# Patient Record
Sex: Male | Born: 1960 | Race: Black or African American | Hispanic: No | Marital: Married | Smoking: Never smoker
Health system: Southern US, Community
[De-identification: ages and names within clinical notes are randomized; demographics above are authoritative.]

## PROBLEM LIST (undated history)

## (undated) DIAGNOSIS — C801 Malignant (primary) neoplasm, unspecified: Secondary | ICD-10-CM

## (undated) DIAGNOSIS — R0981 Nasal congestion: Secondary | ICD-10-CM

## (undated) DIAGNOSIS — E785 Hyperlipidemia, unspecified: Secondary | ICD-10-CM

## (undated) DIAGNOSIS — R519 Headache, unspecified: Secondary | ICD-10-CM

## (undated) DIAGNOSIS — C189 Malignant neoplasm of colon, unspecified: Secondary | ICD-10-CM

## (undated) DIAGNOSIS — R51 Headache: Secondary | ICD-10-CM

## (undated) DIAGNOSIS — M1990 Unspecified osteoarthritis, unspecified site: Secondary | ICD-10-CM

## (undated) DIAGNOSIS — T7840XA Allergy, unspecified, initial encounter: Secondary | ICD-10-CM

## (undated) HISTORY — PX: EYE SURGERY: SHX253

## (undated) HISTORY — DX: Unspecified osteoarthritis, unspecified site: M19.90

## (undated) HISTORY — DX: Hyperlipidemia, unspecified: E78.5

## (undated) HISTORY — DX: Malignant neoplasm of colon, unspecified: C18.9

## (undated) HISTORY — DX: Allergy, unspecified, initial encounter: T78.40XA

## (undated) HISTORY — PX: LAPAROSCOPIC RIGHT COLECTOMY: SHX5925

## (undated) HISTORY — DX: Nasal congestion: R09.81

---

## 2006-09-02 ENCOUNTER — Emergency Department: Payer: Self-pay | Admitting: Emergency Medicine

## 2015-11-23 ENCOUNTER — Encounter: Payer: Self-pay | Admitting: Family Medicine

## 2015-11-23 ENCOUNTER — Ambulatory Visit (INDEPENDENT_AMBULATORY_CARE_PROVIDER_SITE_OTHER): Payer: BLUE CROSS/BLUE SHIELD | Admitting: Family Medicine

## 2015-11-23 VITALS — BP 110/76 | HR 79 | Temp 98.3°F | Ht 71.5 in | Wt 243.0 lb

## 2015-11-23 DIAGNOSIS — Z77011 Contact with and (suspected) exposure to lead: Secondary | ICD-10-CM | POA: Diagnosis not present

## 2015-11-23 DIAGNOSIS — Z1211 Encounter for screening for malignant neoplasm of colon: Secondary | ICD-10-CM

## 2015-11-23 DIAGNOSIS — R5383 Other fatigue: Secondary | ICD-10-CM | POA: Diagnosis not present

## 2015-11-23 DIAGNOSIS — Z125 Encounter for screening for malignant neoplasm of prostate: Secondary | ICD-10-CM | POA: Diagnosis not present

## 2015-11-23 LAB — HIV ANTIBODY (ROUTINE TESTING W REFLEX): HIV: NONREACTIVE

## 2015-11-23 LAB — COMPREHENSIVE METABOLIC PANEL
ALT: 25 U/L (ref 0–53)
AST: 20 U/L (ref 0–37)
Albumin: 4.3 g/dL (ref 3.5–5.2)
Alkaline Phosphatase: 73 U/L (ref 39–117)
BILIRUBIN TOTAL: 0.6 mg/dL (ref 0.2–1.2)
BUN: 16 mg/dL (ref 6–23)
CHLORIDE: 104 meq/L (ref 96–112)
CO2: 28 meq/L (ref 19–32)
Calcium: 9.5 mg/dL (ref 8.4–10.5)
Creatinine, Ser: 1.1 mg/dL (ref 0.40–1.50)
GFR: 73.95 mL/min (ref >60.00)
GLUCOSE: 138 mg/dL — AB (ref 70–99)
Potassium: 3.9 mEq/L (ref 3.5–5.1)
Sodium: 141 mEq/L (ref 135–145)
Total Protein: 7.2 g/dL (ref 6.0–8.3)

## 2015-11-23 LAB — CBC
HEMATOCRIT: 49 % (ref 39.0–52.0)
HEMOGLOBIN: 16 g/dL (ref 13.0–17.0)
MCHC: 32.8 g/dL (ref 30.0–36.0)
MCV: 79 fl (ref 78.0–100.0)
PLATELETS: 211 10*3/uL (ref 150.0–400.0)
RBC: 6.2 Mil/uL — ABNORMAL HIGH (ref 4.22–5.81)
RDW: 13.8 % (ref 11.5–15.5)
WBC: 5.1 10*3/uL (ref 4.0–10.5)

## 2015-11-23 LAB — TSH: TSH: 1 u[IU]/mL (ref 0.35–4.50)

## 2015-11-23 LAB — HEMOGLOBIN A1C: Hgb A1c MFr Bld: 5.8 % (ref 4.6–6.5)

## 2015-11-23 LAB — PSA: PSA: 0.79 ng/mL (ref 0.10–4.00)

## 2015-11-23 NOTE — Patient Instructions (Signed)
Nice to meet you. We are going to obtain some lab work to further evaluate which your fatigue. We'll refer you for a colonoscopy as well. Once lab work comes back we will let you know the results and develop a plan from there. If you develop worsening fatigue or you develop chest pain, shortness of breath, depression, or any new or changing symptoms please seek medical attention.

## 2015-11-23 NOTE — Assessment & Plan Note (Signed)
Check lead level.

## 2015-11-23 NOTE — Assessment & Plan Note (Addendum)
Reports increasing tiredness over the last several months. For the most part he can physically do anything he wants though does note some exertional tiredness as well after a prolonged periods of time. He does have lead exposure through his job and has had similar symptoms in the past when his lead is been mildly elevated. No depression. EKG was obtained and was reassuring. Discussed lab work as outlined below. If lab work is unremarkable could consider referral to cardiology for further evaluation though this would seem less likely cardiac in origin. He'll continue to monitor. He is given return precautions.

## 2015-11-23 NOTE — Progress Notes (Signed)
Patient ID: Arthur West, male   DOB: September 19, 1960, 55 y.o.   MRN: [id number]  Arthur Rumps, MD Phone: [phone]  Arthur West is a 55 y.o. male who presents today for new patient visit.  Patient presents noting he feels more tired than usual over the last several months. 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 Notes they do check this monthly. His last blood level was 24. He wants to have this rechecked today in our office. He notes no chest pain or shortness of breath with this tiredness. Does note he can't quite do as much physically as he used to. No depression. No history of thyroid dysfunction. No history of heart disease. No history of anemia. No history of hypertension, diabetes, or hyperlipidemia. Notes his father and paternal grandfather had MIs in their mid 83s. He notes no history of prostate cancer in his family.  Active Ambulatory Problems    Diagnosis Date Noted  . Other fatigue 11/23/2015  . Lead exposure 11/23/2015   Resolved Ambulatory Problems    Diagnosis Date Noted  . No Resolved Ambulatory Problems   Past Medical History  Diagnosis Date  . Arthritis     Family History  Problem Relation Age of Onset  . Arthritis    . Breast cancer    . Stroke    . Hypertension    . Heart attack Father 62  . Heart attack Paternal Grandfather 38    Social History   Social History  . Marital Status: Married    Spouse Name: N/A  . Number of Children: N/A  . Years of Education: N/A   Occupational History  . Not on file.   Social History Main Topics  . Smoking status: Never Smoker   . Smokeless tobacco: Not on file  . Alcohol Use: 1.2 oz/week    2 Standard drinks or equivalent per week  . Drug Use: No  . Sexual Activity: Not on file   Other Topics Concern  . Not on file   Social History Narrative  . No narrative on file    ROS  General:  Negative for nexplained weight loss, fever Skin: Negative for new or  changing mole, sore that won't heal HEENT: Negative for trouble hearing, trouble seeing, ringing in ears, mouth sores, hoarseness, change in voice, dysphagia. CV:  Negative for chest pain, dyspnea, edema, palpitations Resp: Negative for cough, dyspnea, hemoptysis GI: Negative for nausea, vomiting, diarrhea, constipation, abdominal pain, melena, hematochezia. GU: Negative for dysuria, incontinence, urinary hesitance, hematuria, vaginal or penile discharge, polyuria, sexual difficulty, lumps in testicle or breasts MSK: Negative for muscle cramps or aches, joint pain or swelling Neuro: Negative for headaches, weakness, numbness, dizziness, passing out/fainting Psych: Negative for depression, anxiety, memory problems  Objective  Physical Exam Filed Vitals:   11/23/15 0920  BP: 110/76  Pulse: 79  Temp: 98.3 F (36.8 C)    BP Readings from Last 3 Encounters:  11/23/15 110/76   Wt Readings from Last 3 Encounters:  11/23/15 243 lb (110.224 kg)    Physical Exam  Constitutional: He is well-developed, well-nourished, and in no distress.  HENT:  Head: Normocephalic and atraumatic.  Right Ear: External ear normal.  Left Ear: External ear normal.  Mouth/Throat: Oropharynx is clear and moist. No oropharyngeal exudate.  Eyes: Conjunctivae are normal. Pupils are equal, round, and reactive to light.  Neck: Neck supple.  Cardiovascular: Normal rate, regular rhythm and normal heart sounds.  Exam reveals no  gallop and no friction rub.   No murmur heard. Pulmonary/Chest: Effort normal and breath sounds normal.  Abdominal: Soft. Bowel sounds are normal.  Musculoskeletal: He exhibits no edema.  Lymphadenopathy:    He has no cervical adenopathy.  Neurological: He is alert. Gait normal.  Skin: Skin is warm and dry. He is not diaphoretic.  Psychiatric: Mood and affect normal.   EKG: Normal sinus rhythm, rate 71, low voltages, no ST or T-wave changes  Assessment/Plan:   Other fatigue Reports  increasing tiredness over the last several months. For the most part he can physically do anything he wants though does note some exertional tiredness as well after a prolonged periods of time. He does have lead exposure through his job and has had similar symptoms in the past when his lead is been mildly elevated. No depression. EKG was obtained and was reassuring. Discussed lab work as outlined below. If lab work is unremarkable could consider referral to cardiology for further evaluation though this would seem less likely cardiac in origin. He'll continue to monitor. He is given return precautions.  Lead exposure Check lead level.    Orders Placed This Encounter  Procedures  . Comp Met (CMET)  . CBC  . TSH  . Lead, blood (adult age 67 yrs or greater)    Order Specific Question:  [place] of residence?    Answer:  [place] [[id number]]  . PSA  . HgB A1c  . HIV antibody (with reflex)  . Ambulatory referral to Gastroenterology    Referral Priority:  Routine    Referral Type:  Consultation    Referral Reason:  Specialty Services Required    Number of Visits Requested:  1  . EKG 12-Lead   GI referral for colonoscopy.  Arthur Rumps, MD [place]

## 2015-11-23 NOTE — Progress Notes (Signed)
Pre visit review using our clinic review tool, if applicable. No additional management support is needed unless otherwise documented below in the visit note. 

## 2015-11-24 ENCOUNTER — Encounter: Payer: Self-pay | Admitting: Gastroenterology

## 2015-11-25 LAB — LEAD, BLOOD (ADULT >= 16 YRS): Lead-Whole Blood: 23 ug/dL — ABNORMAL HIGH (ref <5)

## 2015-12-23 ENCOUNTER — Ambulatory Visit (INDEPENDENT_AMBULATORY_CARE_PROVIDER_SITE_OTHER): Payer: BLUE CROSS/BLUE SHIELD | Admitting: Family Medicine

## 2015-12-23 ENCOUNTER — Encounter: Payer: Self-pay | Admitting: Family Medicine

## 2015-12-23 VITALS — BP 148/96 | HR 66 | Temp 98.2°F | Ht 71.5 in | Wt 247.6 lb

## 2015-12-23 DIAGNOSIS — IMO0001 Reserved for inherently not codable concepts without codable children: Secondary | ICD-10-CM

## 2015-12-23 DIAGNOSIS — M170 Bilateral primary osteoarthritis of knee: Secondary | ICD-10-CM | POA: Diagnosis not present

## 2015-12-23 DIAGNOSIS — R5383 Other fatigue: Secondary | ICD-10-CM

## 2015-12-23 DIAGNOSIS — Z77011 Contact with and (suspected) exposure to lead: Secondary | ICD-10-CM

## 2015-12-23 DIAGNOSIS — R7303 Prediabetes: Secondary | ICD-10-CM | POA: Diagnosis not present

## 2015-12-23 DIAGNOSIS — R030 Elevated blood-pressure reading, without diagnosis of hypertension: Secondary | ICD-10-CM | POA: Insufficient documentation

## 2015-12-23 NOTE — Progress Notes (Signed)
Pre visit review using our clinic review tool, if applicable. No additional management support is needed unless otherwise documented below in the visit note. 

## 2015-12-23 NOTE — Progress Notes (Signed)
Patient ID: TYBERIOUS TWETEN, male   DOB: [birth year], 55 y.o.   MRN: [id number]  Tommi Rumps, MD Phone: [phone]  HIATT DASCHER is a 55 y.o. male who presents today for follow-up.  Fatigue: Patient notes this is quite a bit better. Notes they recently checked his lead level at work and it was 22. This is down from previously. Changed respirators. He does wear protective gloves. Notes not as tired. He notes he does work third shift works 12 hour shifts. Only gets about 6 hours sleep a day. No depression. No chest pain. No shortness of breath. Physically can do whatever he wants. Lab workup relatively unremarkable with exception of prediabetes and elevated lead level.  Prediabetes: A1c found to be 5.8 previously. Has been somewhat working on his diet. Cereal and oatmeal in the morning after he gets off work. Wife cooks meals that he takes for his second meal during the day. Typically only eats one to 2 meals a day. No exercise.  Osteoarthritis of the knees: Patient notes he is diagnosed with osteoarthritis in his knees at age 73. Notes they intermittently bother him with an ache discomfort. Sometimes slightly worse than others. No swelling. Does not take anything for these. Notes they were better when he exercised.  Elevated blood pressure: Patient notes no history of elevated blood pressure. Has never been on blood pressure medications. No chest pain or shortness of breath. No edema. No headache.  PMH: nonsmoker.   ROS see history of present illness  Objective  Physical Exam Filed Vitals:   12/23/15 0906  BP: 148/96  Pulse: 66  Temp: 98.2 F (36.8 C)    BP Readings from Last 3 Encounters:  12/23/15 148/96  11/23/15 110/76   Wt Readings from Last 3 Encounters:  12/23/15 247 lb 9.6 oz (112.311 kg)  11/23/15 243 lb (110.224 kg)    Physical Exam  Constitutional: He is well-developed, well-nourished, and in no distress.  HENT:  Head: Normocephalic and atraumatic.  Right  Ear: External ear normal.  Left Ear: External ear normal.  Cardiovascular: Normal rate, regular rhythm and normal heart sounds.   Pulmonary/Chest: Effort normal and breath sounds normal.  Musculoskeletal:  Bilateral knees with no joint line tenderness, no bony or muscular tenderness, no swelling, no erythema, no warmth, no ligamentous laxity, negative McMurray's  Neurological: He is alert. Gait normal.  Skin: Skin is warm and dry. He is not diaphoretic.     Assessment/Plan: Please see individual problem list.  Other fatigue Much improved after his lead level has started to decrease. He has made changes to his respirator at work. Discussed that some of this may be related to his relative lack of sleep as well. Discussed continuing to monitor his lead levels at work and asked him to see if his work could send the levels to [place] each month. Advised that he needs to get increased amount of sleep. He will continue to monitor. Given return precautions.  Lead exposure Reports improved on last check at work. We'll ask that he has his work send his lab work to [place].  Prediabetes Advised on diet and exercise.  Osteoarthritis of both knees History of osteoarthritis in both knees since age of 23. Mildly bothersome to the patient. Discussed continuing to monitor. Anti-inflammatories as needed.  Elevated blood pressure Elevated on exam today. No prior blood pressure elevations. We will have him return in 1 week for nurse check and then with me in a month. If still elevated in  1 week would consider medication.    Tommi Rumps, MD [place]

## 2015-12-23 NOTE — Assessment & Plan Note (Signed)
Reports improved on last check at work. We'll ask that he has his work send his lab work to [place].

## 2015-12-23 NOTE — Assessment & Plan Note (Signed)
Advised on diet and exercise. 

## 2015-12-23 NOTE — Assessment & Plan Note (Signed)
Elevated on exam today. No prior blood pressure elevations. We will have him return in 1 week for nurse check and then with me in a month. If still elevated in 1 week would consider medication.

## 2015-12-23 NOTE — Patient Instructions (Addendum)
Nice to see you. I'm glad you're feeling improved. Please try to eat 3 meals a day with fruits, vegetables, and lean meats. Please start to exercise 1-2 days a week to begin with. Please see if your work can get your lead levels to [place] when they check them. Please monitor your knees. We will have a return in 1 week for nurse visit to check her blood pressure. I will see back in one month. If you develop worsening fatigue, chest pain, shortness of breath, or any other change in symptoms please seek medical attention.

## 2015-12-23 NOTE — Assessment & Plan Note (Signed)
History of osteoarthritis in both knees since age of 80. Mildly bothersome to the patient. Discussed continuing to monitor. Anti-inflammatories as needed.

## 2015-12-23 NOTE — Assessment & Plan Note (Addendum)
Much improved after his lead level has started to decrease. He has made changes to his respirator at work. Discussed that some of this may be related to his relative lack of sleep as well. Discussed continuing to monitor his lead levels at work and asked him to see if his work could send the levels to [place] each month. Advised that he needs to get increased amount of sleep. He will continue to monitor. Given return precautions.

## 2015-12-24 ENCOUNTER — Ambulatory Visit (AMBULATORY_SURGERY_CENTER): Payer: Self-pay | Admitting: *Deleted

## 2015-12-24 VITALS — Ht 71.75 in | Wt 242.4 lb

## 2015-12-24 DIAGNOSIS — Z1211 Encounter for screening for malignant neoplasm of colon: Secondary | ICD-10-CM

## 2015-12-24 MED ORDER — NA SULFATE-K SULFATE-MG SULF 17.5-3.13-1.6 GM/177ML PO SOLN: 1.0000 | Freq: Once | ORAL | Status: DC

## 2015-12-24 NOTE — Progress Notes (Signed)
No egg or soy allergy known to patient  No issues with past sedation with any surgeries  or procedures, no intubation problems  No diet pills per patient No home 02 use per patient  No blood thinners per patient  Pt denies issues with constipation  emmi video declined

## 2015-12-30 ENCOUNTER — Encounter: Payer: Self-pay | Admitting: Gastroenterology

## 2015-12-31 ENCOUNTER — Ambulatory Visit (INDEPENDENT_AMBULATORY_CARE_PROVIDER_SITE_OTHER): Payer: BLUE CROSS/BLUE SHIELD | Admitting: Family Medicine

## 2015-12-31 VITALS — BP 120/88 | HR 74

## 2015-12-31 DIAGNOSIS — R030 Elevated blood-pressure reading, without diagnosis of hypertension: Secondary | ICD-10-CM | POA: Diagnosis not present

## 2015-12-31 DIAGNOSIS — IMO0001 Reserved for inherently not codable concepts without codable children: Secondary | ICD-10-CM

## 2015-12-31 NOTE — Progress Notes (Signed)
Patient was here for nurse visit for blood pressure check. Patient was not here for an office visit he is here for clinical support. Blood pressure appears to be in normal range. We will continue to monitor. Will have nursing change this to a clinical support encounter.

## 2016-01-01 NOTE — Progress Notes (Signed)
Can you please try to fix for Dr. Caryl Bis

## 2016-01-07 ENCOUNTER — Ambulatory Visit (AMBULATORY_SURGERY_CENTER): Payer: BLUE CROSS/BLUE SHIELD | Admitting: Gastroenterology

## 2016-01-07 ENCOUNTER — Encounter: Payer: Self-pay | Admitting: Gastroenterology

## 2016-01-07 VITALS — BP 117/84 | HR 54 | Temp 98.0°F | Resp 12 | Ht 71.0 in | Wt 242.0 lb

## 2016-01-07 DIAGNOSIS — D120 Benign neoplasm of cecum: Secondary | ICD-10-CM

## 2016-01-07 DIAGNOSIS — C182 Malignant neoplasm of ascending colon: Secondary | ICD-10-CM | POA: Diagnosis not present

## 2016-01-07 DIAGNOSIS — D123 Benign neoplasm of transverse colon: Secondary | ICD-10-CM

## 2016-01-07 DIAGNOSIS — Z1211 Encounter for screening for malignant neoplasm of colon: Secondary | ICD-10-CM | POA: Diagnosis not present

## 2016-01-07 DIAGNOSIS — D122 Benign neoplasm of ascending colon: Secondary | ICD-10-CM

## 2016-01-07 MED ORDER — SODIUM CHLORIDE 0.9 % IV SOLN: 500.0000 mL | INTRAVENOUS | Status: DC

## 2016-01-07 NOTE — Progress Notes (Signed)
To PACU  Awake and ALert Report to RN 

## 2016-01-07 NOTE — Progress Notes (Signed)
Called to room to assist during endoscopic procedure.  Patient ID and intended procedure confirmed with present staff. Received instructions for my participation in the procedure from the performing physician.  

## 2016-01-07 NOTE — Patient Instructions (Signed)
Impression/recommendations:  Polyps (handout given)  No aspirin, aspiring containing products or NSAIDS for 2 weeks. Tylenol only if needed until 6/23.  YOU HAD AN ENDOSCOPIC PROCEDURE TODAY AT [facility]:   Refer to the procedure report that was given to you for any specific questions about what was found during the examination.  If the procedure report does not answer your questions, please call your gastroenterologist to clarify.  If you requested that your care partner not be given the details of your procedure findings, then the procedure report has been included in a sealed envelope for you to review at your convenience later.  YOU SHOULD EXPECT: Some feelings of bloating in the abdomen. Passage of more gas than usual.  Walking can help get rid of the air that was put into your GI tract during the procedure and reduce the bloating. If you had a lower endoscopy (such as a colonoscopy or flexible sigmoidoscopy) you may notice spotting of blood in your stool or on the toilet paper. If you underwent a bowel prep for your procedure, you may not have a normal bowel movement for a few days.  Please Note:  You might notice some irritation and congestion in your nose or some drainage.  This is from the oxygen used during your procedure.  There is no need for concern and it should clear up in a day or so.  SYMPTOMS TO REPORT IMMEDIATELY:   Following lower endoscopy (colonoscopy or flexible sigmoidoscopy):  Excessive amounts of blood in the stool  Significant tenderness or worsening of abdominal pains  Swelling of the abdomen that is new, acute  Fever of 100F or higher   For urgent or emergent issues, a gastroenterologist can be reached at any hour by calling [phone].   DIET: Your first meal following the procedure should be a small meal and then it is ok to progress to your normal diet. Heavy or fried foods are harder to digest and may make you feel nauseous or bloated.   Likewise, meals heavy in dairy and vegetables can increase bloating.  Drink plenty of fluids but you should avoid alcoholic beverages for 24 hours.  ACTIVITY:  You should plan to take it easy for the rest of today and you should NOT DRIVE or use heavy machinery until tomorrow (because of the sedation medicines used during the test).    FOLLOW UP: Our staff will call the number listed on your records the next business day following your procedure to check on you and address any questions or concerns that you may have regarding the information given to you following your procedure. If we do not reach you, we will leave a message.  However, if you are feeling well and you are not experiencing any problems, there is no need to return our call.  We will assume that you have returned to your regular daily activities without incident.  If any biopsies were taken you will be contacted by phone or by letter within the next 1-3 weeks.  Please call us at [phone] if you have not heard about the biopsies in 3 weeks.    SIGNATURES/CONFIDENTIALITY: You and/or your care partner have signed paperwork which will be entered into your electronic medical record.  These signatures attest to the fact that that the information above on your After Visit Summary has been reviewed and is understood.  Full responsibility of the confidentiality of this discharge information lies with you and/or your care-partner.

## 2016-01-07 NOTE — Op Note (Signed)
Arthur West Patient Name: Arthur West Procedure Date: 01/07/2016 7:15 AM MRN: [id number] Endoscopist: Remo Lipps P. Havery Moros , MD Age: 55 Referring MD:  Date of Birth: 1960-08-03 Gender: Male Procedure:                Colonoscopy Indications:              Screening for malignant neoplasm in the colon, This                            is the patient's first colonoscopy Medicines:                Monitored Anesthesia Care Procedure:                Pre-Anesthesia Assessment:                           - Prior to the procedure, a History and Physical                            was performed, and patient medications and                            allergies were reviewed. The patient's tolerance of                            previous anesthesia was also reviewed. The risks                            and benefits of the procedure and the sedation                            options and risks were discussed with the patient.                            All questions were answered, and informed consent                            was obtained. Prior Anticoagulants: The patient has                            taken no previous anticoagulant or antiplatelet                            agents. ASA Grade Assessment: II - A patient with                            mild systemic disease. After reviewing the risks                            and benefits, the patient was deemed in                            satisfactory condition to undergo the procedure.  After obtaining informed consent, the colonoscope                            was passed under direct vision. Throughout the                            procedure, the patient's blood pressure, pulse, and                            oxygen saturations were monitored continuously. The                            Model CF-HQ190L [id number]) scope was introduced                            through the anus and advanced to the the  cecum,                            identified by appendiceal orifice and ileocecal                            valve. The colonoscopy was performed without                            difficulty. The patient tolerated the procedure                            well. The quality of the bowel preparation was                            adequate. The ileocecal valve, appendiceal orifice,                            and rectum were photographed. Scope In: 8:01:59 AM Scope Out: 8:30:35 AM Scope Withdrawal Time: 0 hours 26 minutes 20 seconds  Total Procedure Duration: 0 hours 28 minutes 36 seconds  Findings:                 The perianal and digital rectal examinations were                            normal.                           A 3 mm polyp was found in the cecum. The polyp was                            sessile. The polyp was removed with a cold snare.                            Resection and retrieval were complete.                           A 6 mm polyp was found in the ascending colon. The  polyp was sessile. The polyp was removed with a                            cold snare. Resection and retrieval were complete.                           A 8 mm polyp was found in the ascending colon. The                            polyp was semi-pedunculated. The polyp was removed                            with a hot snare. Resection and retrieval were                            complete.                           A 8 mm polyp was found in the transverse colon. The                            polyp was semi-pedunculated. The polyp was removed                            with a hot snare. Resection and retrieval were                            complete.                           A 3 mm polyp was found in the transverse colon. The                            polyp was sessile. The polyp was removed with a                            cold snare. Resection and retrieval were complete.                            A large polypoid lesion was found in the mid                            ascending colon. The lesion was sessile and behind                            a fold. Endoscopic removal was not attempted today                            given the possibility for malignancy. Biopsies were                            taken with a cold forceps for histology, the lesion  appeared hard to palpation with forceps. Two                            adjacent areas were tattooed with an injection of                            Spot (carbon black).                           The exam was otherwise without abnormality on                            direct and retroflexion views. Complications:            No immediate complications. Estimated blood loss:                            Minimal. Estimated Blood Loss:     Estimated blood loss was minimal. Impression:               - One 3 mm polyp in the cecum, removed with a cold                            snare. Resected and retrieved.                           - One 6 mm polyp in the ascending colon, removed                            with a cold snare. Resected and retrieved.                           - One 8 mm polyp in the ascending colon, removed                            with a hot snare. Resected and retrieved.                           - One 8 mm polyp in the transverse colon, removed                            with a hot snare. Resected and retrieved.                           - One 3 mm polyp in the transverse colon, removed                            with a cold snare. Resected and retrieved.                           - Rule out malignancy, polypoid lesion in the mid                            ascending colon. Biopsied. Tattooed.                           -  The examination was otherwise normal on direct                            and retroflexion views. Recommendation:           - Patient has a contact number  available for                            emergencies. The signs and symptoms of potential                            delayed complications were discussed with the                            patient. Return to normal activities tomorrow.                            Written discharge instructions were provided to the                            patient.                           - Resume previous diet.                           - Continue present medications.                           - No aspirin, ibuprofen, naproxen, or other                            non-steroidal anti-inflammatory drugs for 2 weeks                            after polyp removal.                           - Await pathology results.                           - Repeat colonoscopy is recommended for                            surveillance. The colonoscopy date will be                            determined after pathology results from today's                            exam become available for review. Remo Lipps P. Charmine Bockrath, MD 01/07/2016 8:40:25 AM This report has been signed electronically.

## 2016-01-08 ENCOUNTER — Encounter: Payer: Self-pay | Admitting: *Deleted

## 2016-01-08 ENCOUNTER — Telehealth: Payer: Self-pay | Admitting: *Deleted

## 2016-01-08 DIAGNOSIS — C189 Malignant neoplasm of colon, unspecified: Secondary | ICD-10-CM

## 2016-01-08 NOTE — Telephone Encounter (Signed)
I spoke with his wife who was with him yesrterday about the path results, plan for now.    He needs CT scan chest, abd, pelvis with IV and PO contrast He needs cbc, cmet, inr, CEA He needs referral to [facility]  For newly diagnosed colon adenocarcinoma.   Ardine Bjork

## 2016-01-08 NOTE — Telephone Encounter (Signed)
  Follow up Call-  Call back number 01/07/2016  Post procedure Call Back phone  # [phone]  Permission to leave phone message Yes     Patient questions:  Do you have a fever, pain , or abdominal swelling? No. Pain Score  0 *  Have you tolerated food without any problems? Yes.    Have you been able to return to your normal activities? Yes.    Do you have any questions about your discharge instructions: Diet   No. Medications  No. Follow up visit  No.  Do you have questions or concerns about your Care? No.  Actions: * If pain score is 4 or above: No action needed, pain <4.

## 2016-01-08 NOTE — Telephone Encounter (Signed)
Labs in EPIC. Spoke with [facility] and scheduled on 01/19/16 at 9:45 AM with Dr. Johney Maine. Patient is on wait list for cancellation. Left a message for patient to call back.

## 2016-01-08 NOTE — Telephone Encounter (Signed)
Received a call from Dr. Tresa Moore that the ascending polyp is adenocarcinoma. Dr. Ardis Hughs notified.(DOD)

## 2016-01-10 NOTE — Telephone Encounter (Signed)
Linna Hoff thanks much for taking this call and notifying the patient in my absence. Rollene Fare thanks for coordinating CT scans and surgical consult

## 2016-01-11 ENCOUNTER — Other Ambulatory Visit (INDEPENDENT_AMBULATORY_CARE_PROVIDER_SITE_OTHER): Payer: BLUE CROSS/BLUE SHIELD

## 2016-01-11 ENCOUNTER — Telehealth: Payer: Self-pay | Admitting: Gastroenterology

## 2016-01-11 DIAGNOSIS — C189 Malignant neoplasm of colon, unspecified: Secondary | ICD-10-CM

## 2016-01-11 LAB — CBC WITH DIFFERENTIAL/PLATELET
BASOS ABS: 0 10*3/uL (ref 0.0–0.1)
Basophils Relative: 0.3 % (ref 0.0–3.0)
EOS PCT: 5.3 % — AB (ref 0.0–5.0)
Eosinophils Absolute: 0.4 10*3/uL (ref 0.0–0.7)
HEMATOCRIT: 46 % (ref 39.0–52.0)
HEMOGLOBIN: 15.1 g/dL (ref 13.0–17.0)
LYMPHS ABS: 2.6 10*3/uL (ref 0.7–4.0)
Lymphocytes Relative: 37.8 % (ref 12.0–46.0)
MCHC: 32.8 g/dL (ref 30.0–36.0)
MCV: 78.3 fl (ref 78.0–100.0)
MONO ABS: 0.6 10*3/uL (ref 0.1–1.0)
Monocytes Relative: 8.2 % (ref 3.0–12.0)
Neutro Abs: 3.3 10*3/uL (ref 1.4–7.7)
Neutrophils Relative %: 48.4 % (ref 43.0–77.0)
Platelets: 226 10*3/uL (ref 150.0–400.0)
RBC: 5.87 Mil/uL — ABNORMAL HIGH (ref 4.22–5.81)
RDW: 13.6 % (ref 11.5–15.5)
WBC: 6.8 10*3/uL (ref 4.0–10.5)

## 2016-01-11 LAB — COMPREHENSIVE METABOLIC PANEL
ALBUMIN: 4.3 g/dL (ref 3.5–5.2)
ALT: 22 U/L (ref 0–53)
AST: 19 U/L (ref 0–37)
Alkaline Phosphatase: 57 U/L (ref 39–117)
BILIRUBIN TOTAL: 0.8 mg/dL (ref 0.2–1.2)
BUN: 24 mg/dL — ABNORMAL HIGH (ref 6–23)
CALCIUM: 9.5 mg/dL (ref 8.4–10.5)
CO2: 28 mEq/L (ref 19–32)
CREATININE: 1.2 mg/dL (ref 0.40–1.50)
Chloride: 104 mEq/L (ref 96–112)
GFR: 66.85 mL/min (ref >60.00)
Glucose, Bld: 96 mg/dL (ref 70–99)
Potassium: 3.8 mEq/L (ref 3.5–5.1)
Sodium: 140 mEq/L (ref 135–145)
Total Protein: 7.4 g/dL (ref 6.0–8.3)

## 2016-01-11 LAB — PROTIME-INR
INR: 1.1 ratio — AB (ref 0.8–1.0)
PROTHROMBIN TIME: 11.7 s (ref 9.6–13.1)

## 2016-01-11 NOTE — Telephone Encounter (Signed)
Spoke with patient's wife and gave her appointments for CT and [facility]. Patient will pick up contrast, instructions and have labs done.

## 2016-01-11 NOTE — Telephone Encounter (Signed)
Happy to gelp

## 2016-01-12 ENCOUNTER — Ambulatory Visit (INDEPENDENT_AMBULATORY_CARE_PROVIDER_SITE_OTHER)
Admission: RE | Admit: 2016-01-12 | Discharge: 2016-01-12 | Disposition: A | Discharge status: Home / Self Care | Payer: BLUE CROSS/BLUE SHIELD | Source: Ambulatory Visit | Attending: Gastroenterology | Admitting: Gastroenterology

## 2016-01-12 DIAGNOSIS — C189 Malignant neoplasm of colon, unspecified: Secondary | ICD-10-CM

## 2016-01-12 LAB — CEA: CEA: 0.7 ng/mL

## 2016-01-12 MED ORDER — IOPAMIDOL (ISOVUE-300) INJECTION 61%
100.0000 mL | Freq: Once | INTRAVENOUS | Status: AC | PRN
  Administered 2016-01-12: 100 mL via INTRAVENOUS

## 2016-01-13 ENCOUNTER — Other Ambulatory Visit: Payer: Self-pay

## 2016-01-13 DIAGNOSIS — C189 Malignant neoplasm of colon, unspecified: Secondary | ICD-10-CM

## 2016-01-15 ENCOUNTER — Ambulatory Visit (HOSPITAL_COMMUNITY)
Admission: RE | Admit: 2016-01-15 | Discharge: 2016-01-15 | Disposition: A | Discharge status: Home / Self Care | Payer: BLUE CROSS/BLUE SHIELD | Source: Ambulatory Visit | Attending: Gastroenterology | Admitting: Gastroenterology

## 2016-01-15 DIAGNOSIS — K7689 Other specified diseases of liver: Secondary | ICD-10-CM | POA: Insufficient documentation

## 2016-01-15 DIAGNOSIS — C189 Malignant neoplasm of colon, unspecified: Secondary | ICD-10-CM | POA: Diagnosis not present

## 2016-01-15 DIAGNOSIS — D1803 Hemangioma of intra-abdominal structures: Secondary | ICD-10-CM | POA: Insufficient documentation

## 2016-01-15 MED ORDER — GADOBENATE DIMEGLUMINE 529 MG/ML IV SOLN
20.0000 mL | Freq: Once | INTRAVENOUS | Status: AC | PRN
  Administered 2016-01-15: 20 mL via INTRAVENOUS

## 2016-01-19 ENCOUNTER — Other Ambulatory Visit: Payer: Self-pay | Admitting: Surgery

## 2016-01-19 NOTE — H&P (Signed)
Arthur West 01/19/2016 10:53 AM Location: [facility] Patient #: [id number] DOB: [birth year] Married / Language: Arthur West / Race: White Male  Patient Care Team: Arthur Haven, MD as PCP - General (Family Medicine) Arthur Boston, MD as Consulting Physician (General Surgery) Arthur Gunning, MD as Consulting Physician (Gastroenterology)   History of Present Illness Arthur Hector MD; 01/19/2016 1:17 PM) The patient is a 55 year old male who presents with colorectal cancer. Note for "Colorectal cancer": Patient sent for surgical consultation for concern of cancer of ascending colon. Dr. Havery West with [facility] gastroenterology.  Pleasant active male. Comes today with his wife. Underwent screening colonoscopy. Numerous polyps found. 5 removed. Largest one and ascending colon biopsy. Consistent with cancer. Surgical consultation requested. CEA normal. CT of chest abdomen and pelvis showed perhaps a enlarged mesenteric lymph node. Question of small liver masses. 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 The other is a hemangioma. No definite evidence of metastatic disease. Surgical consultation requested.  Patient has no prior surgery. He does not smoke. He is not diabetic. He is moderately active. 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 Worried about being off work. Usually moves his bowels every day. No personal nor family history of inflammatory bowel disease, irritable bowel syndrome, allergy such as Celiac Sprue, dietary/dairy problems, colitis, ulcers nor gastritis. No recent sick contacts/gastroenteritis. No travel outside the country. No changes in diet. No dysphagia to solids or liquids. No significant heartburn or reflux. No hematochezia, hematemesis, coffee ground emesis. No evidence of prior gastric/peptic ulceration.     Diagnosis 1. Colon, polyp(s),  transverse x2, cecum x1, ascending x2, polyp (5 total) - TUBULAR ADENOMA (X7 FRAGMENTS). - NO HIGH GRADE DYSPLASIA OR MALIGNANCY. 2. Colon, polyp(s), ascending, polyp - ADENOCARCINOMA. Microscopic Comment 2. Dr. Avis West has reviewed the case. The case was called to Dr. Doyne West nurse on 01/07/2016. Arthur Males MD Pathologist, Electronic Signature (Case signed 01/08/2016) Specimen Arthur West and Clinical Information Specimen Comment 1. [facility]; special screening for malignant neoplasms, colon Specimen(s) Obtained: 1. Colon, polyp(s), transverse x2, cecum x1, ascending x2, polyp (5 total) 2. Colon, polyp(s), ascending, polyp Specimen Clinical Information 1. R/O adenoma 2. R/O cancer/malignancy 1 of 2 FINAL for [facility], [name] [phone]) Arthur West 1. Received in formalin and consists of multiple polypoid pieces of tan brown soft tissue, ranging from 0.4 cm to 0.9 x 0.5 x 0.5 cm. The possible resection margins on the two largest polyps are inked black, and the specimen is entirely submitted in two cassettes. 2. Received in formalin are tan, soft tissue fragments that are submitted in toto. Number: multiple, Size: range from 0.1 to 0.3 cm.(KL:gt, 01/07/16) Report signed out from the following location(s) Technical Component was performed at [facility]. [address],[place] [id number]., Interpretation was performed at [facility] [place], [place], [place] [address]. CLIA #: [id number], 2 of 2   Other Problems Arthur West, CMA; 01/19/2016 10:53 AM) Colon Cancer  Past Surgical History Arthur West, CMA; 01/19/2016 10:53 AM) Vasectomy  Diagnostic Studies History Arthur West, CMA; 01/19/2016 10:53 AM) Colonoscopy within last year  Allergies Arthur West, CMA; 01/19/2016 10:54 AM) No Known Drug Allergies 01/19/2016  Medication History (Arthur West, CMA; 01/19/2016 10:54 AM) No Current  Medications Medications Reconciled  Social History Arthur West, CMA; 01/19/2016 10:53 AM) Alcohol use Moderate alcohol use. No caffeine use No drug use Tobacco use Never smoker.  Family History Arthur West, [place];  01/19/2016 10:53 AM) Alcohol Abuse Father. Arthritis Daughter, Mother, Sister. Breast Cancer Sister. Cervical Cancer Sister. Hypertension Mother.     Review of Systems Arthur West CMA; 01/19/2016 10:53 AM) General Not Present- Appetite Loss, Chills, Fatigue, Fever, Night Sweats, Weight Gain and Weight Loss. Skin Not Present- Change in Wart/Mole, Dryness, Hives, Jaundice, New Lesions, Non-Healing Wounds, Rash and Ulcer. HEENT Present- Sinus Pain. Not Present- Earache, Hearing Loss, Hoarseness, Nose Bleed, Oral Ulcers, Ringing in the Ears, Seasonal Allergies, Sore Throat, Visual Disturbances, Wears glasses/contact lenses and Yellow Eyes. Respiratory Present- Snoring. Not Present- Bloody sputum, Chronic Cough, Difficulty Breathing and Wheezing. Breast Not Present- Breast Mass, Breast Pain, Nipple Discharge and Skin Changes. Cardiovascular Not Present- Chest Pain, Difficulty Breathing Lying Down, Leg Cramps, Palpitations, Rapid Heart Rate, Shortness of Breath and Swelling of Extremities. Gastrointestinal Present- Change in Bowel Habits. Not Present- Abdominal Pain, Bloating, Bloody Stool, Chronic diarrhea, Constipation, Difficulty Swallowing, Excessive gas, Gets full quickly at meals, Hemorrhoids, Indigestion, Nausea, Rectal Pain and Vomiting. Male Genitourinary Not Present- Blood in Urine, Change in Urinary Stream, Frequency, Impotence, Nocturia, Painful Urination, Urgency and Urine Leakage. Musculoskeletal Present- Back Pain. Not Present- Joint Pain, Joint Stiffness, Muscle Pain, Muscle Weakness and Swelling of Extremities. Neurological Not Present- Decreased Memory, Fainting, Headaches, Numbness, Seizures, Tingling, Tremor, Trouble walking and Weakness. Psychiatric Not  Present- Anxiety, Bipolar, Change in Sleep Pattern, Depression, Fearful and Frequent crying. Endocrine Not Present- Cold Intolerance, Excessive Hunger, Hair Changes, Heat Intolerance, Hot flashes and New Diabetes. Hematology Not Present- Easy Bruising, Excessive bleeding, Gland problems, HIV and Persistent Infections.  Vitals (Arthur West CMA; 01/19/2016 10:54 AM) 01/19/2016 10:53 AM Weight: 234 lb Height: 71in Body Surface Area: 2.25 m Body Mass Index: 32.64 kg/m  Temp.: 50F(Temporal)  Pulse: 79 (Regular)  BP: 126/80 (Sitting, Left Arm, Standard)      Physical Exam Arthur Hector MD; 01/19/2016 11:26 AM)  General Mental Status-Alert. General Appearance-Not in acute distress, Not Sickly. Orientation-Oriented X3. Hydration-Well hydrated. Voice-Normal.  Integumentary Global Assessment Upon inspection and palpation of skin surfaces of the - Axillae: non-tender, no inflammation or ulceration, no drainage. and Distribution of scalp and body hair is normal. General Characteristics Temperature - normal warmth is noted.  Head and Neck Head-normocephalic, atraumatic with no lesions or palpable masses. Face Global Assessment - atraumatic, no absence of expression. Neck Global Assessment - no abnormal movements, no bruit auscultated on the right, no bruit auscultated on the left, no decreased range of motion, non-tender. Trachea-midline. Thyroid Gland Characteristics - non-tender.  Eye Eyeball - Left-Extraocular movements intact, No Nystagmus. Eyeball - Right-Extraocular movements intact, No Nystagmus. Cornea - Left-No Hazy. Cornea - Right-No Hazy. Sclera/Conjunctiva - Left-No scleral icterus, No Discharge. Sclera/Conjunctiva - Right-No scleral icterus, No Discharge. Pupil - Left-Direct reaction to light normal. Pupil - Right-Direct reaction to light normal.  ENMT Ears Pinna - Left - no drainage observed, no generalized tenderness  observed. Right - no drainage observed, no generalized tenderness observed. Nose and Sinuses External Inspection of the Nose - no destructive lesion observed. Inspection of the nares - Left - quiet respiration. Right - quiet respiration. Mouth and Throat Lips - Upper Lip - no fissures observed, no pallor noted. Lower Lip - no fissures observed, no pallor noted. Nasopharynx - no discharge present. Oral Cavity/Oropharynx - Tongue - no dryness observed. Oral Mucosa - no cyanosis observed. Hypopharynx - no evidence of airway distress observed.  Chest and Lung Exam Inspection Movements - Normal and Symmetrical. Accessory muscles - No use of accessory muscles  in breathing. Palpation Palpation of the chest reveals - Non-tender. Auscultation Breath sounds - Normal and Clear.  Cardiovascular Auscultation Rhythm - Regular. Murmurs & Other Heart Sounds - Auscultation of the heart reveals - No Murmurs and No Systolic Clicks.  Abdomen Inspection Inspection of the abdomen reveals - No Visible peristalsis and No Abnormal pulsations. Umbilicus - No Bleeding, No Urine drainage. Palpation/Percussion Palpation and Percussion of the abdomen reveal - Soft, Non Tender, No Rebound tenderness, No Rigidity (guarding) and No Cutaneous hyperesthesia. Note: Abdomen soft. Nontender, nondistended. No guarding. No umbilical no other hernias  Male Genitourinary Sexual Maturity Tanner 5 - Adult hair pattern and Adult penile size and shape. Note: No inguinal hernias. Normal external genitalia. Epididymi, testes, and spermatic cords normal without any masses.  Rectal Note: Deferred given recent colonoscopy. Cancer in ascending colon.  Peripheral Vascular Upper Extremity Inspection - Left - No Cyanotic nailbeds, Not Ischemic. Right - No Cyanotic nailbeds, Not Ischemic.  Neurologic Neurologic evaluation reveals -normal attention span and ability to concentrate, able to name objects and repeat phrases.  Appropriate fund of knowledge , normal sensation and normal coordination. Mental Status Affect - not angry, not paranoid. Cranial Nerves-Normal Bilaterally. Gait-Normal.  Neuropsychiatric Mental status exam performed with findings of-able to articulate well with normal speech/language, rate, volume and coherence, thought content normal with ability to perform basic computations and apply abstract reasoning and no evidence of hallucinations, delusions, obsessions or homicidal/suicidal ideation.  Musculoskeletal Global Assessment Spine, Ribs and Pelvis - no instability, subluxation or laxity. Right Upper Extremity - no instability, subluxation or laxity.  Lymphatic Head & Neck  General Head & Neck Lymphatics: Bilateral - Description - No Localized lymphadenopathy. Axillary  General Axillary Region: Bilateral - Description - No Localized lymphadenopathy. Femoral & Inguinal  Generalized Femoral & Inguinal Lymphatics: Left - Description - No Localized lymphadenopathy. Right - Description - No Localized lymphadenopathy.    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 Largest one and ascending colon has cancer. Perhaps enlarged mesenteric lymph node. No evidence of distal metastatic disease. 2 liver masses turnout to be cyst and hemangioma on follow-up MRI.  I think he would benefit from segmental colonic resection. Good candidate for a minimally invasive approach. We'll see if we can do this robotically with intracorporeal anastomosis.  Discussed at length with the patient and his wife. They feel confortable with proceeding.  PREOP COLON - ENCOUNTER FOR PREOPERATIVE EXAMINATION FOR GENERAL SURGICAL PROCEDURE (Z01.818)  Current Plans You are being scheduled for surgery - Our schedulers will call you.  You should hear from our office's  scheduling department within 5 working days about the location, date, and time of surgery. We try to make accommodations for patient's preferences in scheduling surgery, but sometimes the OR schedule or the surgeon's schedule prevents [place] from making those accommodations.  If you have not heard from our office [phone]) in 5 working days, call the office and ask for your surgeon's nurse.  If you have other questions about your diagnosis, plan, or surgery, call the office and ask for your surgeon's nurse.  Written instructions provided Pt Education - [facility] Colon Bowel Prep 2015 Miralax/Antibiotics Started Neomycin Sulfate 500MG , 2 (two) Tablet SEE NOTE, #6, 01/19/2016, No Refill. Local Order: TAKE TWO TABLETS AT 2 PM, 3 PM, AND 10 PM THE DAY PRIOR TO SURGERY Started Flagyl 500MG , 2 (two) Tablet SEE NOTE, #6, 01/19/2016,  No Refill. Local Order: Take at 2pm, 3pm, and 10pm the day prior to your colon operation Pt Education - [facility] Colectomy post-op instructions: discussed with patient and provided information. Pt Education - [facility] Good Bowel Health (Ginnie Marich) Pt Education - [facility] Pain Control (Eusevio Schriver) Pt Education - Pamphlet Given - Laparoscopic Colorectal Surgery: discussed with patient and provided information.  Arthur West, M.D., F.A.C.S. Gastrointestinal and Minimally Invasive Surgery [facility] [address] [place], [place] [address] [phone] Main / Paging

## 2016-01-19 NOTE — Progress Notes (Signed)
Thanks for seeing him and taking care of him. Arthur West

## 2016-01-21 NOTE — Patient Instructions (Addendum)
Arthur West  01/21/2016   Your procedure is scheduled on: 01-29-16  Report to [facility] Main  Entrance take [facility]  elevators to 3rd floor to  [place] at 530  AM.  Call this number if you have problems the morning of surgery [phone]   Remember: ONLY 1 PERSON MAY GO WITH YOU TO SHORT STAY TO GET  READY MORNING OF [place].  Do not eat food :After Midnight, Wednesday NIGHT, CLEAR LIQUIDS ALL DAY Thursday 01-28-16 PER DR GROSS, DRINK PLENTY OF FLUIDS WITH PREP, FOLLOW ALL BOWEL PREP INSTRUCTIONS GIVEN BY DR GROSS. NO CLEAR LIQUIDS AFTER MIDNIGHT Thursday NIGHT.      Take these medicines the morning of surgery with A SIP OF WATER: NONE                               You may not have any metal on your body including hair pins and              piercings  Do not wear jewelry, make-up, lotions, powders or perfumes, deodorant             Do not wear nail polish.  Do not shave  48 hours prior to surgery.              Men may shave face and neck.   Do not bring valuables to the hospital. [place].  Contacts, dentures or bridgework may not be worn into surgery.  Leave suitcase in the car. After surgery it may be brought to your room.               Please read over the following fact sheets you were given: _____________________________________________________________________                CLEAR LIQUID DIET   Foods Allowed                                                                     Foods Excluded  Coffee and tea, regular and decaf                             liquids that you cannot  Plain Jell-O in any flavor                                             see through such as: Fruit ices (not with fruit pulp)                                     milk, soups, orange juice  Iced Popsicles  All solid food Carbonated beverages, regular and diet                                     Cranberry, grape and apple juices Sports drinks like Gatorade Lightly seasoned clear broth or consume(fat free) Sugar, honey syrup  Sample Menu Breakfast                                Lunch                                     Supper Cranberry juice                    Beef broth                            Chicken broth Jell-O                                     Grape juice                           Apple juice Coffee or tea                        Jell-O                                      Popsicle                                                Coffee or tea                        Coffee or tea  _____________________________________________________________________  [facility] - Preparing for Surgery Before surgery, you can play an important role.  Because skin is not sterile, your skin needs to be as free of germs as possible.  You can reduce the number of germs on your skin by washing with CHG (chlorahexidine gluconate) soap before surgery.  CHG is an antiseptic cleaner which kills germs and bonds with the skin to continue killing germs even after washing. Please DO NOT use if you have an allergy to CHG or antibacterial soaps.  If your skin becomes reddened/irritated stop using the CHG and inform your nurse when you arrive at Short Stay. Do not shave (including legs and underarms) for at least 48 hours prior to the first CHG shower.  You may shave your face/neck. Please follow these instructions carefully:  1.  Shower with CHG Soap the night before surgery and the  morning of Surgery.  2.  If you choose to wash your hair, wash your hair first as usual with your  normal  shampoo.  3.  After you shampoo, rinse your hair and body thoroughly to remove the  shampoo.  4.  Use CHG as you would any other liquid soap.  You can apply chg directly  to the skin and wash                       Gently with a scrungie or clean washcloth.  5.  Apply the CHG Soap to  your body ONLY FROM THE NECK DOWN.   Do not use on face/ open                           Wound or open sores. Avoid contact with eyes, ears mouth and genitals (private parts).                       Wash face,  Genitals (private parts) with your normal soap.             6.  Wash thoroughly, paying special attention to the area where your surgery  will be performed.  7.  Thoroughly rinse your body with warm water from the neck down.  8.  DO NOT shower/wash with your normal soap after using and rinsing off  the CHG Soap.                9.  Pat yourself dry with a clean towel.            10.  Wear clean pajamas.            11.  Place clean sheets on your bed the night of your first shower and do not  sleep with pets. Day of Surgery : Do not apply any lotions/deodorants the morning of surgery.  Please wear clean clothes to the hospital/surgery center.  FAILURE TO FOLLOW THESE INSTRUCTIONS MAY RESULT IN THE CANCELLATION OF YOUR SURGERY PATIENT SIGNATURE_________________________________  NURSE SIGNATURE__________________________________  ________________________________________________________________________

## 2016-01-22 ENCOUNTER — Encounter (HOSPITAL_COMMUNITY)
Admission: RE | Admit: 2016-01-22 | Discharge: 2016-01-22 | Disposition: A | Discharge status: Home / Self Care | Payer: BLUE CROSS/BLUE SHIELD | Source: Ambulatory Visit | Attending: Surgery | Admitting: Surgery

## 2016-01-22 ENCOUNTER — Encounter (HOSPITAL_COMMUNITY): Payer: Self-pay

## 2016-01-22 DIAGNOSIS — Z0183 Encounter for blood typing: Secondary | ICD-10-CM | POA: Insufficient documentation

## 2016-01-22 DIAGNOSIS — C182 Malignant neoplasm of ascending colon: Secondary | ICD-10-CM | POA: Diagnosis not present

## 2016-01-22 DIAGNOSIS — Z01812 Encounter for preprocedural laboratory examination: Secondary | ICD-10-CM | POA: Insufficient documentation

## 2016-01-22 HISTORY — DX: Headache, unspecified: R51.9

## 2016-01-22 HISTORY — DX: Malignant (primary) neoplasm, unspecified: C80.1

## 2016-01-22 HISTORY — DX: Headache: R51

## 2016-01-22 LAB — ABO/RH: ABO/RH(D): O POS

## 2016-01-22 LAB — CBC
HCT: 45.3 % (ref 39.0–52.0)
Hemoglobin: 15.3 g/dL (ref 13.0–17.0)
MCH: 25.9 pg — AB (ref 26.0–34.0)
MCHC: 33.8 g/dL (ref 30.0–36.0)
MCV: 76.6 fL — AB (ref 78.0–100.0)
PLATELETS: 183 10*3/uL (ref 150–400)
RBC: 5.91 MIL/uL — ABNORMAL HIGH (ref 4.22–5.81)
RDW: 13.1 % (ref 11.5–15.5)
WBC: 5.4 10*3/uL (ref 4.0–10.5)

## 2016-01-22 NOTE — Progress Notes (Signed)
01-12-16 - CT ABD/Pelvis - EPIC 01-12-16 - CT Chest - EPIC 11-23-15 - EKG - EPIC

## 2016-01-23 LAB — HEMOGLOBIN A1C
HEMOGLOBIN A1C: 5.6 % (ref 4.8–5.6)
Mean Plasma Glucose: 114 mg/dL

## 2016-01-28 ENCOUNTER — Ambulatory Visit: Payer: BLUE CROSS/BLUE SHIELD | Admitting: Family Medicine

## 2016-01-28 MED ORDER — SODIUM CHLORIDE 0.9 % IV SOLN
INTRAVENOUS | Status: DC
  Filled 2016-01-28: qty 6

## 2016-01-29 ENCOUNTER — Inpatient Hospital Stay (HOSPITAL_COMMUNITY)
Admission: RE | Admit: 2016-01-29 | Discharge: 2016-01-31 | DRG: 331 | Disposition: A | Discharge status: Home / Self Care | Payer: BLUE CROSS/BLUE SHIELD | Source: Ambulatory Visit | Attending: Surgery | Admitting: Surgery

## 2016-01-29 ENCOUNTER — Encounter (HOSPITAL_COMMUNITY)
Admission: RE | Disposition: A | Discharge status: Home / Self Care | Payer: Self-pay | Source: Ambulatory Visit | Attending: Surgery

## 2016-01-29 ENCOUNTER — Inpatient Hospital Stay (HOSPITAL_COMMUNITY): Payer: BLUE CROSS/BLUE SHIELD | Admitting: Certified Registered Nurse Anesthetist

## 2016-01-29 ENCOUNTER — Encounter (HOSPITAL_COMMUNITY): Payer: Self-pay | Admitting: *Deleted

## 2016-01-29 DIAGNOSIS — C182 Malignant neoplasm of ascending colon: Secondary | ICD-10-CM | POA: Diagnosis present

## 2016-01-29 DIAGNOSIS — Z86010 Personal history of colonic polyps: Secondary | ICD-10-CM

## 2016-01-29 DIAGNOSIS — Z8049 Family history of malignant neoplasm of other genital organs: Secondary | ICD-10-CM

## 2016-01-29 DIAGNOSIS — K7689 Other specified diseases of liver: Secondary | ICD-10-CM | POA: Diagnosis present

## 2016-01-29 DIAGNOSIS — Z8249 Family history of ischemic heart disease and other diseases of the circulatory system: Secondary | ICD-10-CM

## 2016-01-29 LAB — TYPE AND SCREEN
ABO/RH(D): O POS
Antibody Screen: NEGATIVE

## 2016-01-29 SURGERY — COLECTOMY, PARTIAL, ROBOT-ASSISTED, LAPAROSCOPIC
Anesthesia: General | Site: Abdomen

## 2016-01-29 MED ORDER — GABAPENTIN 300 MG PO CAPS
300.0000 mg | ORAL_CAPSULE | ORAL | Status: AC
  Administered 2016-01-29: 300 mg via ORAL
  Filled 2016-01-29: qty 1

## 2016-01-29 MED ORDER — LACTATED RINGERS IR SOLN
Status: DC | PRN
  Administered 2016-01-29: 1000 mL

## 2016-01-29 MED ORDER — LIDOCAINE HCL (CARDIAC) 20 MG/ML IV SOLN
INTRAVENOUS | Status: DC | PRN
  Administered 2016-01-29: 100 mg via INTRAVENOUS

## 2016-01-29 MED ORDER — ROCURONIUM BROMIDE 100 MG/10ML IV SOLN
INTRAVENOUS | Status: DC | PRN
  Administered 2016-01-29: 10 mg via INTRAVENOUS
  Administered 2016-01-29: 60 mg via INTRAVENOUS
  Administered 2016-01-29 (×3): 10 mg via INTRAVENOUS

## 2016-01-29 MED ORDER — MIDAZOLAM HCL 5 MG/5ML IJ SOLN
INTRAMUSCULAR | Status: DC | PRN
  Administered 2016-01-29: 2 mg via INTRAVENOUS

## 2016-01-29 MED ORDER — HYDROMORPHONE HCL 1 MG/ML IJ SOLN
INTRAMUSCULAR | Status: AC
Start: 2016-01-29 — End: 2016-01-30
  Filled 2016-01-29: qty 1

## 2016-01-29 MED ORDER — PROCHLORPERAZINE EDISYLATE 5 MG/ML IJ SOLN
10.0000 mg | Freq: Four times a day (QID) | INTRAMUSCULAR | Status: DC | PRN
  Administered 2016-01-30: 10 mg via INTRAVENOUS
  Filled 2016-01-29: qty 2

## 2016-01-29 MED ORDER — PROPOFOL 10 MG/ML IV BOLUS
INTRAVENOUS | Status: DC | PRN
  Administered 2016-01-29: 250 mg via INTRAVENOUS

## 2016-01-29 MED ORDER — PHENYLEPHRINE 40 MCG/ML (10ML) SYRINGE FOR IV PUSH (FOR BLOOD PRESSURE SUPPORT)
PREFILLED_SYRINGE | INTRAVENOUS | Status: AC
  Filled 2016-01-29: qty 10

## 2016-01-29 MED ORDER — LACTATED RINGERS IV SOLN
INTRAVENOUS | Status: DC
  Administered 2016-01-30 – 2016-01-31 (×2): via INTRAVENOUS

## 2016-01-29 MED ORDER — LACTATED RINGERS IV BOLUS (SEPSIS)
1000.0000 mL | Freq: Three times a day (TID) | INTRAVENOUS | Status: AC | PRN
  Administered 2016-01-29: 1000 mL via INTRAVENOUS
  Filled 2016-01-29: qty 1000

## 2016-01-29 MED ORDER — FENTANYL CITRATE (PF) 100 MCG/2ML IJ SOLN
INTRAMUSCULAR | Status: DC | PRN
  Administered 2016-01-29 (×5): 50 ug via INTRAVENOUS

## 2016-01-29 MED ORDER — PROPOFOL 10 MG/ML IV BOLUS
INTRAVENOUS | Status: AC
  Filled 2016-01-29: qty 20

## 2016-01-29 MED ORDER — SACCHAROMYCES BOULARDII 250 MG PO CAPS
250.0000 mg | ORAL_CAPSULE | Freq: Two times a day (BID) | ORAL | Status: DC
  Administered 2016-01-29 – 2016-01-31 (×4): 250 mg via ORAL
  Filled 2016-01-29 (×4): qty 1

## 2016-01-29 MED ORDER — CEFOTETAN DISODIUM 2 G IJ SOLR
2.0000 g | INTRAMUSCULAR | Status: AC
  Administered 2016-01-29: 2 g via INTRAVENOUS

## 2016-01-29 MED ORDER — LIDOCAINE HCL (CARDIAC) 20 MG/ML IV SOLN
INTRAVENOUS | Status: AC
Start: 2016-01-29 — End: 2016-01-29
  Filled 2016-01-29: qty 5

## 2016-01-29 MED ORDER — ASPIRIN EC 81 MG PO TBEC
81.0000 mg | DELAYED_RELEASE_TABLET | Freq: Every day | ORAL | Status: DC
  Administered 2016-01-29 – 2016-01-31 (×3): 81 mg via ORAL
  Filled 2016-01-29 (×3): qty 1

## 2016-01-29 MED ORDER — BUPIVACAINE LIPOSOME 1.3 % IJ SUSP
20.0000 mL | INTRAMUSCULAR | Status: DC
  Filled 2016-01-29: qty 20

## 2016-01-29 MED ORDER — ENOXAPARIN SODIUM 40 MG/0.4ML ~~LOC~~ SOLN
40.0000 mg | Freq: Once | SUBCUTANEOUS | Status: AC
  Administered 2016-01-29: 40 mg via SUBCUTANEOUS
  Filled 2016-01-29: qty 0.4

## 2016-01-29 MED ORDER — EPHEDRINE SULFATE 50 MG/ML IJ SOLN
INTRAMUSCULAR | Status: AC
  Filled 2016-01-29: qty 1

## 2016-01-29 MED ORDER — BUPIVACAINE LIPOSOME 1.3 % IJ SUSP
INTRAMUSCULAR | Status: DC | PRN
  Administered 2016-01-29: 20 mL

## 2016-01-29 MED ORDER — ACETAMINOPHEN 500 MG PO TABS
1000.0000 mg | ORAL_TABLET | ORAL | Status: AC
  Administered 2016-01-29: 1000 mg via ORAL
  Filled 2016-01-29: qty 2

## 2016-01-29 MED ORDER — HYDROMORPHONE HCL 2 MG/ML IJ SOLN
INTRAMUSCULAR | Status: AC
  Filled 2016-01-29: qty 1

## 2016-01-29 MED ORDER — LACTATED RINGERS IV SOLN
INTRAVENOUS | Status: DC | PRN
  Administered 2016-01-29 (×3): via INTRAVENOUS

## 2016-01-29 MED ORDER — HYDROMORPHONE HCL 1 MG/ML IJ SOLN
0.2500 mg | INTRAMUSCULAR | Status: DC | PRN
  Administered 2016-01-29 (×4): 0.5 mg via INTRAVENOUS

## 2016-01-29 MED ORDER — CELECOXIB 200 MG PO CAPS
400.0000 mg | ORAL_CAPSULE | ORAL | Status: AC
  Administered 2016-01-29: 400 mg via ORAL
  Filled 2016-01-29: qty 2

## 2016-01-29 MED ORDER — BUPIVACAINE-EPINEPHRINE 0.25% -1:200000 IJ SOLN
INTRAMUSCULAR | Status: AC
  Filled 2016-01-29: qty 1

## 2016-01-29 MED ORDER — FENTANYL CITRATE (PF) 250 MCG/5ML IJ SOLN
INTRAMUSCULAR | Status: AC
Start: 2016-01-29 — End: 2016-01-29
  Filled 2016-01-29: qty 5

## 2016-01-29 MED ORDER — MAGIC MOUTHWASH
15.0000 mL | Freq: Four times a day (QID) | ORAL | Status: DC | PRN
  Filled 2016-01-29: qty 15

## 2016-01-29 MED ORDER — SODIUM CHLORIDE 0.9 % IJ SOLN
INTRAMUSCULAR | Status: AC
  Filled 2016-01-29: qty 10

## 2016-01-29 MED ORDER — LIP MEDEX EX OINT
TOPICAL_OINTMENT | CUTANEOUS | Status: AC
  Filled 2016-01-29: qty 7

## 2016-01-29 MED ORDER — ALUM & MAG HYDROXIDE-SIMETH 200-200-20 MG/5ML PO SUSP: 30.0000 mL | Freq: Four times a day (QID) | ORAL | Status: DC | PRN

## 2016-01-29 MED ORDER — LACTATED RINGERS IV SOLN: INTRAVENOUS | Status: DC

## 2016-01-29 MED ORDER — MENTHOL 3 MG MT LOZG
1.0000 | LOZENGE | OROMUCOSAL | Status: DC | PRN
  Administered 2016-01-29: 3 mg via ORAL
  Filled 2016-01-29: qty 9

## 2016-01-29 MED ORDER — ALVIMOPAN 12 MG PO CAPS
12.0000 mg | ORAL_CAPSULE | Freq: Once | ORAL | Status: AC
  Administered 2016-01-29: 12 mg via ORAL
  Filled 2016-01-29: qty 1

## 2016-01-29 MED ORDER — ONDANSETRON HCL 4 MG/2ML IJ SOLN
INTRAMUSCULAR | Status: DC | PRN
  Administered 2016-01-29: 4 mg via INTRAVENOUS

## 2016-01-29 MED ORDER — METOPROLOL TARTRATE 5 MG/5ML IV SOLN: 5.0000 mg | Freq: Four times a day (QID) | INTRAVENOUS | Status: DC | PRN

## 2016-01-29 MED ORDER — NEOMYCIN SULFATE 500 MG PO TABS: 1000.0000 mg | ORAL_TABLET | ORAL | Status: DC

## 2016-01-29 MED ORDER — BISACODYL 5 MG PO TBEC: 20.0000 mg | DELAYED_RELEASE_TABLET | Freq: Once | ORAL | Status: DC

## 2016-01-29 MED ORDER — PHENOL 1.4 % MT LIQD: 2.0000 | OROMUCOSAL | Status: DC | PRN

## 2016-01-29 MED ORDER — ALVIMOPAN 12 MG PO CAPS
12.0000 mg | ORAL_CAPSULE | Freq: Two times a day (BID) | ORAL | Status: DC
  Administered 2016-01-30: 12 mg via ORAL
  Filled 2016-01-29: qty 1

## 2016-01-29 MED ORDER — METRONIDAZOLE 500 MG PO TABS: 1000.0000 mg | ORAL_TABLET | ORAL | Status: DC

## 2016-01-29 MED ORDER — SUGAMMADEX SODIUM 200 MG/2ML IV SOLN
INTRAVENOUS | Status: AC
  Filled 2016-01-29: qty 4

## 2016-01-29 MED ORDER — SUGAMMADEX SODIUM 200 MG/2ML IV SOLN
INTRAVENOUS | Status: DC | PRN
  Administered 2016-01-29: 220 mg via INTRAVENOUS

## 2016-01-29 MED ORDER — OXYCODONE HCL 5 MG PO TABS: 5.0000 mg | ORAL_TABLET | Freq: Four times a day (QID) | ORAL | Status: DC | PRN

## 2016-01-29 MED ORDER — DEXTROSE 5 % IV SOLN
2.0000 g | Freq: Two times a day (BID) | INTRAVENOUS | Status: AC
  Administered 2016-01-29: 2 g via INTRAVENOUS
  Filled 2016-01-29 (×3): qty 2

## 2016-01-29 MED ORDER — ENOXAPARIN SODIUM 40 MG/0.4ML ~~LOC~~ SOLN
40.0000 mg | SUBCUTANEOUS | Status: DC
  Administered 2016-01-30 – 2016-01-31 (×2): 40 mg via SUBCUTANEOUS
  Filled 2016-01-29 (×2): qty 0.4

## 2016-01-29 MED ORDER — PROPOFOL 10 MG/ML IV BOLUS
INTRAVENOUS | Status: AC
Start: 2016-01-29 — End: 2016-01-29
  Filled 2016-01-29: qty 20

## 2016-01-29 MED ORDER — ONDANSETRON HCL 4 MG/2ML IJ SOLN
INTRAMUSCULAR | Status: AC
  Filled 2016-01-29: qty 2

## 2016-01-29 MED ORDER — LIP MEDEX EX OINT
1.0000 "application " | TOPICAL_OINTMENT | Freq: Two times a day (BID) | CUTANEOUS | Status: DC
  Administered 2016-01-29 – 2016-01-31 (×3): 1 via TOPICAL

## 2016-01-29 MED ORDER — PHENYLEPHRINE 40 MCG/ML (10ML) SYRINGE FOR IV PUSH (FOR BLOOD PRESSURE SUPPORT)
PREFILLED_SYRINGE | INTRAVENOUS | Status: AC
Start: 2016-01-29 — End: 2016-01-29
  Filled 2016-01-29: qty 10

## 2016-01-29 MED ORDER — MIDAZOLAM HCL 2 MG/2ML IJ SOLN
INTRAMUSCULAR | Status: AC
  Filled 2016-01-29: qty 2

## 2016-01-29 MED ORDER — ARTIFICIAL TEARS OP OINT
TOPICAL_OINTMENT | OPHTHALMIC | Status: AC
Start: 2016-01-29 — End: 2016-01-29
  Filled 2016-01-29: qty 3.5

## 2016-01-29 MED ORDER — DIPHENHYDRAMINE HCL 50 MG/ML IJ SOLN: 12.5000 mg | Freq: Four times a day (QID) | INTRAMUSCULAR | Status: DC | PRN

## 2016-01-29 MED ORDER — HYDROMORPHONE HCL 1 MG/ML IJ SOLN
0.5000 mg | INTRAMUSCULAR | Status: DC | PRN
  Administered 2016-01-29 – 2016-01-31 (×9): 1 mg via INTRAVENOUS
  Filled 2016-01-29 (×9): qty 1

## 2016-01-29 MED ORDER — DIPHENHYDRAMINE HCL 12.5 MG/5ML PO ELIX: 12.5000 mg | ORAL_SOLUTION | Freq: Four times a day (QID) | ORAL | Status: DC | PRN

## 2016-01-29 MED ORDER — ROCURONIUM BROMIDE 100 MG/10ML IV SOLN
INTRAVENOUS | Status: AC
  Filled 2016-01-29: qty 1

## 2016-01-29 MED ORDER — ADULT MULTIVITAMIN W/MINERALS CH
1.0000 | ORAL_TABLET | Freq: Every day | ORAL | Status: DC
  Administered 2016-01-29 – 2016-01-31 (×3): 1 via ORAL
  Filled 2016-01-29 (×3): qty 1

## 2016-01-29 MED ORDER — PHENYLEPHRINE HCL 10 MG/ML IJ SOLN
INTRAMUSCULAR | Status: DC | PRN
  Administered 2016-01-29 (×2): 40 ug via INTRAVENOUS
  Administered 2016-01-29 (×3): 80 ug via INTRAVENOUS
  Administered 2016-01-29: 40 ug via INTRAVENOUS
  Administered 2016-01-29: 80 ug via INTRAVENOUS
  Administered 2016-01-29: 40 ug via INTRAVENOUS
  Administered 2016-01-29: 80 ug via INTRAVENOUS

## 2016-01-29 MED ORDER — ENSURE ENLIVE PO LIQD
237.0000 mL | Freq: Two times a day (BID) | ORAL | Status: DC
  Administered 2016-01-29 – 2016-01-31 (×2): 237 mL via ORAL

## 2016-01-29 MED ORDER — CEFOTETAN DISODIUM-DEXTROSE 2-2.08 GM-% IV SOLR
INTRAVENOUS | Status: AC
  Filled 2016-01-29: qty 50

## 2016-01-29 MED ORDER — MEPERIDINE HCL 50 MG/ML IJ SOLN
6.2500 mg | INTRAMUSCULAR | Status: DC | PRN
  Administered 2016-01-29: 12.5 mg via INTRAVENOUS

## 2016-01-29 MED ORDER — SODIUM CHLORIDE 0.9 % IV SOLN
INTRAVENOUS | Status: DC | PRN
  Administered 2016-01-29: 10:00:00 via INTRAPERITONEAL

## 2016-01-29 MED ORDER — 0.9 % SODIUM CHLORIDE (POUR BTL) OPTIME
TOPICAL | Status: DC | PRN
  Administered 2016-01-29: 2000 mL

## 2016-01-29 MED ORDER — MEPERIDINE HCL 50 MG/ML IJ SOLN
INTRAMUSCULAR | Status: AC
  Filled 2016-01-29: qty 1

## 2016-01-29 MED ORDER — HYDROMORPHONE HCL 1 MG/ML IJ SOLN
INTRAMUSCULAR | Status: AC
  Filled 2016-01-29: qty 1

## 2016-01-29 MED ORDER — METHOCARBAMOL 1000 MG/10ML IJ SOLN
1000.0000 mg | Freq: Four times a day (QID) | INTRAVENOUS | Status: DC | PRN
  Administered 2016-01-29: 1000 mg via INTRAVENOUS
  Filled 2016-01-29 (×2): qty 10

## 2016-01-29 MED ORDER — ACETAMINOPHEN 500 MG PO TABS
1000.0000 mg | ORAL_TABLET | Freq: Three times a day (TID) | ORAL | Status: DC
  Administered 2016-01-29 – 2016-01-31 (×5): 1000 mg via ORAL
  Filled 2016-01-29 (×5): qty 2

## 2016-01-29 MED ORDER — POLYETHYLENE GLYCOL 3350 17 GM/SCOOP PO POWD: 1.0000 | Freq: Once | ORAL | Status: DC

## 2016-01-29 MED ORDER — ARTIFICIAL TEARS OP OINT
TOPICAL_OINTMENT | OPHTHALMIC | Status: DC | PRN
  Administered 2016-01-29: 1 via OPHTHALMIC

## 2016-01-29 SURGICAL SUPPLY — 103 items
APPLIER CLIP 5 13 M/L LIGAMAX5 (MISCELLANEOUS)
APPLIER CLIP ROT 10 11.4 M/L (STAPLE)
APR CLP MED LRG 11.4X10 (STAPLE)
APR CLP MED LRG 5 ANG JAW (MISCELLANEOUS)
BLADE EXTENDED COATED 6.5IN (ELECTRODE) IMPLANT
CANNULA REDUC XI 12-8 STAPL (CANNULA) ×1
CANNULA REDUC XI 12-8MM STAPL (CANNULA) ×1
CANNULA REDUCER 12-8 DVNC XI (CANNULA) ×1 IMPLANT
CELLS DAT CNTRL 66122 CELL SVR (MISCELLANEOUS) IMPLANT
CHLORAPREP W/TINT 26ML (MISCELLANEOUS) ×1 IMPLANT
CLIP APPLIE 5 13 M/L LIGAMAX5 (MISCELLANEOUS) IMPLANT
CLIP APPLIE ROT 10 11.4 M/L (STAPLE) IMPLANT
CLIP LIGATING HEM O LOK PURPLE (MISCELLANEOUS) IMPLANT
CLIP LIGATING HEMO O LOK GREEN (MISCELLANEOUS) IMPLANT
COUNTER NEEDLE 20 DBL MAG RED (NEEDLE) ×3 IMPLANT
COVER MAYO STAND STRL (DRAPES) ×6 IMPLANT
COVER TIP SHEARS 8 DVNC (MISCELLANEOUS) ×1 IMPLANT
COVER TIP SHEARS 8MM DA VINCI (MISCELLANEOUS) ×2
DECANTER SPIKE VIAL GLASS SM (MISCELLANEOUS) ×3 IMPLANT
DEVICE TROCAR PUNCTURE CLOSURE (ENDOMECHANICALS) IMPLANT
DRAIN CHANNEL 19F RND (DRAIN) IMPLANT
DRAPE ARM DVNC X/XI (DISPOSABLE) ×4 IMPLANT
DRAPE COLUMN DVNC XI (DISPOSABLE) ×1 IMPLANT
DRAPE DA VINCI XI ARM (DISPOSABLE) ×8
DRAPE DA VINCI XI COLUMN (DISPOSABLE) ×2
DRAPE SURG IRRIG POUCH 19X23 (DRAPES) ×3 IMPLANT
DRSG OPSITE POSTOP 4X10 (GAUZE/BANDAGES/DRESSINGS) IMPLANT
DRSG OPSITE POSTOP 4X6 (GAUZE/BANDAGES/DRESSINGS) ×2 IMPLANT
DRSG OPSITE POSTOP 4X8 (GAUZE/BANDAGES/DRESSINGS) IMPLANT
DRSG TEGADERM 2-3/8X2-3/4 SM (GAUZE/BANDAGES/DRESSINGS) ×8 IMPLANT
DRSG TEGADERM 4X4.75 (GAUZE/BANDAGES/DRESSINGS) IMPLANT
ELECT PENCIL ROCKER SW 15FT (MISCELLANEOUS) ×6 IMPLANT
ELECT REM PT RETURN 15FT ADLT (MISCELLANEOUS) ×3 IMPLANT
ENDOLOOP SUT PDS II  0 18 (SUTURE)
ENDOLOOP SUT PDS II 0 18 (SUTURE) IMPLANT
EVACUATOR SILICONE 100CC (DRAIN) IMPLANT
GAUZE SPONGE 2X2 8PLY STRL LF (GAUZE/BANDAGES/DRESSINGS) ×1 IMPLANT
GAUZE SPONGE 4X4 12PLY STRL (GAUZE/BANDAGES/DRESSINGS) IMPLANT
GLOVE ECLIPSE 8.0 STRL XLNG CF (GLOVE) ×9 IMPLANT
GLOVE INDICATOR 8.0 STRL GRN (GLOVE) ×9 IMPLANT
GOWN STRL REUS W/TWL XL LVL3 (GOWN DISPOSABLE) ×15 IMPLANT
HOLDER FOLEY CATH W/STRAP (MISCELLANEOUS) ×3 IMPLANT
KIT PROCEDURE DA VINCI SI (MISCELLANEOUS)
KIT PROCEDURE DVNC SI (MISCELLANEOUS) ×1 IMPLANT
LEGGING LITHOTOMY PAIR STRL (DRAPES) ×1 IMPLANT
LUBRICANT JELLY K Y 4OZ (MISCELLANEOUS) IMPLANT
NDL INSUFFLATION 14GA 120MM (NEEDLE) ×1 IMPLANT
NEEDLE INSUFFLATION 14GA 120MM (NEEDLE) ×3 IMPLANT
PACK CARDIOVASCULAR III (CUSTOM PROCEDURE TRAY) ×3 IMPLANT
PACK COLON (CUSTOM PROCEDURE TRAY) ×3 IMPLANT
PAD POSITIONING PINK XL (MISCELLANEOUS) ×3 IMPLANT
PORT LAP GEL ALEXIS MED 5-9CM (MISCELLANEOUS) ×3 IMPLANT
RELOAD STAPLE 45 BLU REG DVNC (STAPLE) IMPLANT
RELOAD STAPLE 45 GRN THCK DVNC (STAPLE) IMPLANT
RETRACTOR WND ALEXIS 18 MED (MISCELLANEOUS) IMPLANT
RTRCTR WOUND ALEXIS 18CM MED (MISCELLANEOUS)
SCISSORS LAP 5X35 DISP (ENDOMECHANICALS) ×3 IMPLANT
SEAL CANN UNIV 5-8 DVNC XI (MISCELLANEOUS) ×3 IMPLANT
SEAL XI 5MM-8MM UNIVERSAL (MISCELLANEOUS) ×6
SEALER VESSEL DA VINCI XI (MISCELLANEOUS) ×2
SEALER VESSEL EXT DVNC XI (MISCELLANEOUS) ×1 IMPLANT
SET BI-LUMEN FLTR TB AIRSEAL (TUBING) ×3 IMPLANT
SET TUBE IRRIG SUCTION NO TIP (IRRIGATION / IRRIGATOR) ×3 IMPLANT
SLEEVE ADV FIXATION 5X100MM (TROCAR) ×3 IMPLANT
SOLUTION ELECTROLUBE (MISCELLANEOUS) ×3 IMPLANT
SPONGE GAUZE 2X2 STER 10/PKG (GAUZE/BANDAGES/DRESSINGS) ×2
STAPLER 45 BLU RELOAD XI (STAPLE) ×5 IMPLANT
STAPLER 45 BLUE RELOAD XI (STAPLE) ×10
STAPLER 45 GREEN RELOAD XI (STAPLE)
STAPLER 45 GRN RELOAD XI (STAPLE) IMPLANT
STAPLER CANNULA SEAL DVNC XI (STAPLE) ×1 IMPLANT
STAPLER CANNULA SEAL XI (STAPLE) ×2
STAPLER SHEATH (SHEATH) ×2
STAPLER SHEATH ENDOWRIST DVNC (SHEATH) ×1 IMPLANT
SUT MNCRL AB 4-0 PS2 18 (SUTURE) ×3 IMPLANT
SUT PDS AB 1 CTX 36 (SUTURE) ×6 IMPLANT
SUT PDS AB 1 TP1 96 (SUTURE) IMPLANT
SUT PDS AB 2-0 CT2 27 (SUTURE) IMPLANT
SUT PROLENE 0 CT 2 (SUTURE) ×3 IMPLANT
SUT PROLENE 2 0 SH DA (SUTURE) IMPLANT
SUT SILK 2 0 (SUTURE) ×3
SUT SILK 2 0 SH CR/8 (SUTURE) ×3 IMPLANT
SUT SILK 2-0 18XBRD TIE 12 (SUTURE) ×1 IMPLANT
SUT SILK 3 0 (SUTURE) ×3
SUT SILK 3 0 SH CR/8 (SUTURE) ×3 IMPLANT
SUT SILK 3-0 18XBRD TIE 12 (SUTURE) ×1 IMPLANT
SUT V-LOC BARB 180 2/0GR6 GS22 (SUTURE)
SUT VIC AB 3-0 SH 18 (SUTURE) IMPLANT
SUT VIC AB 3-0 SH 27 (SUTURE)
SUT VIC AB 3-0 SH 27XBRD (SUTURE) IMPLANT
SUT VICRYL 0 UR6 27IN ABS (SUTURE) ×3 IMPLANT
SUTURE V-LC BRB 180 2/0GR6GS22 (SUTURE) IMPLANT
SYRINGE 10CC LL (SYRINGE) ×3 IMPLANT
SYS LAPSCP GELPORT 120MM (MISCELLANEOUS)
SYSTEM LAPSCP GELPORT 120MM (MISCELLANEOUS) IMPLANT
TAPE UMBILICAL COTTON 1/8X30 (MISCELLANEOUS) ×3 IMPLANT
TOWEL OR 17X26 10 PK STRL BLUE (TOWEL DISPOSABLE) IMPLANT
TOWEL OR NON WOVEN STRL DISP B (DISPOSABLE) ×3 IMPLANT
TRAY FOLEY W/METER SILVER 14FR (SET/KITS/TRAYS/PACK) IMPLANT
TRAY FOLEY W/METER SILVER 16FR (SET/KITS/TRAYS/PACK) IMPLANT
TROCAR ADV FIXATION 5X100MM (TROCAR) IMPLANT
TUBING CONNECTING 10 (TUBING) IMPLANT
TUBING CONNECTING 10' (TUBING)

## 2016-01-29 NOTE — H&P (View-Only) (Signed)
Arthur West 01/19/2016 10:53 AM Location: [facility] Patient #: [id number] DOB: June 05, 1961 Married / Language: Arthur West / Race: White Male  Patient Care Team: Arthur Haven, MD as PCP - General (Family Medicine) Arthur Boston, MD as Consulting Physician (General Surgery) Arthur Gunning, MD as Consulting Physician (Gastroenterology)   History of Present Illness Arthur Hector MD; 01/19/2016 1:17 PM) The patient is a 55 year old male who presents with colorectal cancer. Note for "Colorectal cancer": Patient sent for surgical consultation for concern of cancer of ascending colon. Dr. Havery West with [facility] gastroenterology.  Pleasant active male. Comes today with his wife. Underwent screening colonoscopy. Numerous polyps found. 5 removed. Largest one and ascending colon biopsy. Consistent with cancer. Surgical consultation requested. CEA normal. CT of chest abdomen and pelvis showed perhaps a enlarged mesenteric lymph node. Question of small liver masses. 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 The other is a hemangioma. No definite evidence of metastatic disease. Surgical consultation requested.  Patient has no prior surgery. He does not smoke. He is not diabetic. He is moderately active. 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 Worried about being off work. Usually moves his bowels every day. No personal nor family history of inflammatory bowel disease, irritable bowel syndrome, allergy such as Celiac Sprue, dietary/dairy problems, colitis, ulcers nor gastritis. No recent sick contacts/gastroenteritis. No travel outside the country. No changes in diet. No dysphagia to solids or liquids. No significant heartburn or reflux. No hematochezia, hematemesis, coffee ground emesis. No evidence of prior gastric/peptic ulceration.     Diagnosis 1. Colon, polyp(s),  transverse x2, cecum x1, ascending x2, polyp (5 total) - TUBULAR ADENOMA (X7 FRAGMENTS). - NO HIGH GRADE DYSPLASIA OR MALIGNANCY. 2. Colon, polyp(s), ascending, polyp - ADENOCARCINOMA. Microscopic Comment 2. Dr. Avis West has reviewed the case. The case was called to Dr. Doyne West nurse on 01/07/2016. Arthur Males MD Pathologist, Electronic Signature (Case signed 01/08/2016) Specimen Arthur West and Clinical Information Specimen Comment 1. [facility]; special screening for malignant neoplasms, colon Specimen(s) Obtained: 1. Colon, polyp(s), transverse x2, cecum x1, ascending x2, polyp (5 total) 2. Colon, polyp(s), ascending, polyp Specimen Clinical Information 1. R/O adenoma 2. R/O cancer/malignancy 1 of 2 FINAL for Arthur West [phone]) Arthur West 1. Received in formalin and consists of multiple polypoid pieces of tan brown soft tissue, ranging from 0.4 cm to 0.9 x 0.5 x 0.5 cm. The possible resection margins on the two largest polyps are inked black, and the specimen is entirely submitted in two cassettes. 2. Received in formalin are tan, soft tissue fragments that are submitted in toto. Number: multiple, Size: range from 0.1 to 0.3 cm.(KL:gt, 01/07/16) Report signed out from the following location(s) Technical Component was performed at [facility]. [address],[place] [id number].[id number]., Interpretation was performed at [facility] [address], [place], [place] [address]. CLIA #: [id number], 2 of 2   Other Problems Arthur West, CMA; 01/19/2016 10:53 AM) Colon Cancer  Past Surgical History Arthur West, CMA; 01/19/2016 10:53 AM) Vasectomy  Diagnostic Studies History Arthur West, CMA; 01/19/2016 10:53 AM) Colonoscopy within last year  Allergies Arthur West, CMA; 01/19/2016 10:54 AM) No Known Drug Allergies 01/19/2016  Medication History (Arthur West, CMA; 01/19/2016 10:54 AM) No Current  Medications Medications Reconciled  Social History Arthur West, CMA; 01/19/2016 10:53 AM) Alcohol use Moderate alcohol use. No caffeine use No drug use Tobacco use Never smoker.  Family History Arthur West, [place];  01/19/2016 10:53 AM) Alcohol Abuse Father. Arthritis Daughter, Mother, Sister. Breast Cancer Sister. Cervical Cancer Sister. Hypertension Mother.     Review of Systems Arthur West CMA; 01/19/2016 10:53 AM) General Not Present- Appetite Loss, Chills, Fatigue, Fever, Night Sweats, Weight Gain and Weight Loss. Skin Not Present- Change in Wart/Mole, Dryness, Hives, Jaundice, New Lesions, Non-Healing Wounds, Rash and Ulcer. HEENT Present- Sinus Pain. Not Present- Earache, Hearing Loss, Hoarseness, Nose Bleed, Oral Ulcers, Ringing in the Ears, Seasonal Allergies, Sore Throat, Visual Disturbances, Wears glasses/contact lenses and Yellow Eyes. Respiratory Present- Snoring. Not Present- Bloody sputum, Chronic Cough, Difficulty Breathing and Wheezing. Breast Not Present- Breast Mass, Breast Pain, Nipple Discharge and Skin Changes. Cardiovascular Not Present- Chest Pain, Difficulty Breathing Lying Down, Leg Cramps, Palpitations, Rapid Heart Rate, Shortness of Breath and Swelling of Extremities. Gastrointestinal Present- Change in Bowel Habits. Not Present- Abdominal Pain, Bloating, Bloody Stool, Chronic diarrhea, Constipation, Difficulty Swallowing, Excessive gas, Gets full quickly at meals, Hemorrhoids, Indigestion, Nausea, Rectal Pain and Vomiting. Male Genitourinary Not Present- Blood in Urine, Change in Urinary Stream, Frequency, Impotence, Nocturia, Painful Urination, Urgency and Urine Leakage. Musculoskeletal Present- Back Pain. Not Present- Joint Pain, Joint Stiffness, Muscle Pain, Muscle Weakness and Swelling of Extremities. Neurological Not Present- Decreased Memory, Fainting, Headaches, Numbness, Seizures, Tingling, Tremor, Trouble walking and Weakness. Psychiatric Not  Present- Anxiety, Bipolar, Change in Sleep Pattern, Depression, Fearful and Frequent crying. Endocrine Not Present- Cold Intolerance, Excessive Hunger, Hair Changes, Heat Intolerance, Hot flashes and New Diabetes. Hematology Not Present- Easy Bruising, Excessive bleeding, Gland problems, HIV and Persistent Infections.  Vitals (Arthur West CMA; 01/19/2016 10:54 AM) 01/19/2016 10:53 AM Weight: 234 lb Height: 71in Body Surface Area: 2.25 m Body Mass Index: 32.64 kg/m  Temp.: 74F(Temporal)  Pulse: 79 (Regular)  BP: 126/80 (Sitting, Left Arm, Standard)      Physical Exam Arthur Hector MD; 01/19/2016 11:26 AM)  General Mental Status-Alert. General Appearance-Not in acute distress, Not Sickly. Orientation-Oriented X3. Hydration-Well hydrated. Voice-Normal.  Integumentary Global Assessment Upon inspection and palpation of skin surfaces of the - Axillae: non-tender, no inflammation or ulceration, no drainage. and Distribution of scalp and body hair is normal. General Characteristics Temperature - normal warmth is noted.  Head and Neck Head-normocephalic, atraumatic with no lesions or palpable masses. Face Global Assessment - atraumatic, no absence of expression. Neck Global Assessment - no abnormal movements, no bruit auscultated on the right, no bruit auscultated on the left, no decreased range of motion, non-tender. Trachea-midline. Thyroid Gland Characteristics - non-tender.  Eye Eyeball - Left-Extraocular movements intact, No Nystagmus. Eyeball - Right-Extraocular movements intact, No Nystagmus. Cornea - Left-No Hazy. Cornea - Right-No Hazy. Sclera/Conjunctiva - Left-No scleral icterus, No Discharge. Sclera/Conjunctiva - Right-No scleral icterus, No Discharge. Pupil - Left-Direct reaction to light normal. Pupil - Right-Direct reaction to light normal.  ENMT Ears Pinna - Left - no drainage observed, no generalized tenderness  observed. Right - no drainage observed, no generalized tenderness observed. Nose and Sinuses External Inspection of the Nose - no destructive lesion observed. Inspection of the nares - Left - quiet respiration. Right - quiet respiration. Mouth and Throat Lips - Upper Lip - no fissures observed, no pallor noted. Lower Lip - no fissures observed, no pallor noted. Nasopharynx - no discharge present. Oral Cavity/Oropharynx - Tongue - no dryness observed. Oral Mucosa - no cyanosis observed. Hypopharynx - no evidence of airway distress observed.  Chest and Lung Exam Inspection Movements - Normal and Symmetrical. Accessory muscles - No use of accessory muscles  in breathing. Palpation Palpation of the chest reveals - Non-tender. Auscultation Breath sounds - Normal and Clear.  Cardiovascular Auscultation Rhythm - Regular. Murmurs & Other Heart Sounds - Auscultation of the heart reveals - No Murmurs and No Systolic Clicks.  Abdomen Inspection Inspection of the abdomen reveals - No Visible peristalsis and No Abnormal pulsations. Umbilicus - No Bleeding, No Urine drainage. Palpation/Percussion Palpation and Percussion of the abdomen reveal - Soft, Non Tender, No Rebound tenderness, No Rigidity (guarding) and No Cutaneous hyperesthesia. Note: Abdomen soft. Nontender, nondistended. No guarding. No umbilical no other hernias  Male Genitourinary Sexual Maturity Tanner 5 - Adult hair pattern and Adult penile size and shape. Note: No inguinal hernias. Normal external genitalia. Epididymi, testes, and spermatic cords normal without any masses.  Rectal Note: Deferred given recent colonoscopy. Cancer in ascending colon.  Peripheral Vascular Upper Extremity Inspection - Left - No Cyanotic nailbeds, Not Ischemic. Right - No Cyanotic nailbeds, Not Ischemic.  Neurologic Neurologic evaluation reveals -normal attention span and ability to concentrate, able to name objects and repeat phrases.  Appropriate fund of knowledge , normal sensation and normal coordination. Mental Status Affect - not angry, not paranoid. Cranial Nerves-Normal Bilaterally. Gait-Normal.  Neuropsychiatric Mental status exam performed with findings of-able to articulate well with normal speech/language, rate, volume and coherence, thought content normal with ability to perform basic computations and apply abstract reasoning and no evidence of hallucinations, delusions, obsessions or homicidal/suicidal ideation.  Musculoskeletal Global Assessment Spine, Ribs and Pelvis - no instability, subluxation or laxity. Right Upper Extremity - no instability, subluxation or laxity.  Lymphatic Head & Neck  General Head & Neck Lymphatics: Bilateral - Description - No Localized lymphadenopathy. Axillary  General Axillary Region: Bilateral - Description - No Localized lymphadenopathy. Femoral & Inguinal  Generalized Femoral & Inguinal Lymphatics: Left - Description - No Localized lymphadenopathy. Right - Description - No Localized lymphadenopathy.    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 Largest one and ascending colon has cancer. Perhaps enlarged mesenteric lymph node. No evidence of distal metastatic disease. 2 liver masses turnout to be cyst and hemangioma on follow-up MRI.  I think he would benefit from segmental colonic resection. Good candidate for a minimally invasive approach. We'll see if we can do this robotically with intracorporeal anastomosis.  Discussed at length with the patient and his wife. They feel confortable with proceeding.  PREOP COLON - ENCOUNTER FOR PREOPERATIVE EXAMINATION FOR GENERAL SURGICAL PROCEDURE (Z01.818)  Current Plans You are being scheduled for surgery - Our schedulers will call you.  You should hear from our office's  scheduling department within 5 working days about the location, date, and time of surgery. We try to make accommodations for patient's preferences in scheduling surgery, but sometimes the OR schedule or the surgeon's schedule prevents [place] from making those accommodations.  If you have not heard from our office [phone]) in 5 working days, call the office and ask for your surgeon's nurse.  If you have other questions about your diagnosis, plan, or surgery, call the office and ask for your surgeon's nurse.  Written instructions provided Pt Education - [facility] Colon Bowel Prep 2015 Miralax/Antibiotics Started Neomycin Sulfate 500MG , 2 (two) Tablet SEE NOTE, #6, 01/19/2016, No Refill. Local Order: TAKE TWO TABLETS AT 2 PM, 3 PM, AND 10 PM THE DAY PRIOR TO SURGERY Started Flagyl 500MG , 2 (two) Tablet SEE NOTE, #6, 01/19/2016,  No Refill. Local Order: Take at 2pm, 3pm, and 10pm the day prior to your colon operation Pt Education - [facility] Colectomy post-op instructions: discussed with patient and provided information. Pt Education - [facility] Good Bowel Health (Javonta Gronau) Pt Education - [facility] Pain Control (Hessie Varone) Pt Education - Pamphlet Given - Laparoscopic Colorectal Surgery: discussed with patient and provided information.  Arthur West, M.D., F.A.C.S. Gastrointestinal and Minimally Invasive Surgery [facility] [address], [place], [place] [address] [phone] Main / Paging

## 2016-01-29 NOTE — Op Note (Signed)
01/29/2016  11:28 AM  PATIENT:  Arthur West  55 y.o. male  Patient Care Team: Leone Haven, MD as PCP - General (Family Medicine) Michael Boston, MD as Consulting Physician (General Surgery) Manus Gunning, MD as Consulting Physician (Gastroenterology)  PRE-OPERATIVE DIAGNOSIS:  COLON CANCER  POST-OPERATIVE DIAGNOSIS:  ASCENDING COLON CANCER  PROCEDURE:   XI ROBOT ASSISTED PROXIMAL RIGHT COLECTOMY  SURGEON:  Michael Boston, MD  ASSISTANT:  Ralene Ok, MD  ANESTHESIA:   local and general  EBL:  Total I/O In: 2300 [I.V.:2300] Out: 150 [Urine:100; Blood:50]  Delay start of Pharmacological VTE agent (>24hrs) due to surgical blood loss or risk of bleeding:  no  DRAINS: none   SPECIMEN:  PROXIMAL "RIGHT" COLON  DISPOSITION OF SPECIMEN:  PATHOLOGY  COUNTS:  YES  PLAN OF CARE: Admit to inpatient   PATIENT DISPOSITION:  PACU - hemodynamically stable.  INDICATION:    Patient found to have suspicious mass in proximal ascending colon.  Biopsy concerning for adenocarcinoma  I recommended segmental resection:  The anatomy & physiology of the digestive tract was discussed.  The pathophysiology was discussed.  Natural history risks without surgery was discussed.   I worked to give an overview of the disease and the frequent need to have multispecialty involvement.  I feel the risks of no intervention will lead to serious problems that outweigh the operative risks; therefore, I recommended a partial colectomy to remove the pathology.  Laparoscopic & open techniques were discussed.   Risks such as bleeding, infection, abscess, leak, reoperation, possible ostomy, hernia, heart attack, death, and other risks were discussed.  I noted a good likelihood this will help address the problem.   Goals of post-operative recovery were discussed as well.  We will work to minimize complications.  An educational handout on the pathology was given as well.  Questions were answered.     The patient expresses understanding & wishes to proceed with surgery.  OR FINDINGS:   Patient had a 3 cm firm mass in proximal ascending colon at level of tattooing.  No obvious metastatic disease on visceral parietal peritoneum or liver.  It is an ileocolonic anastomosis that rests in the epigastric region.  DESCRIPTION:   Informed consent was confirmed.  The patient underwent general anaesthesia without difficulty.  The patient was positioned with arms tucked & secured appropriately.  VTE prevention in place.  The patient's abdomen was clipped, prepped, & draped in a sterile fashion.  Surgical timeout confirmed our plan.  The patient was positioned in reverse Trendelenburg.  Abdominal entry was gained using Veress needle technique using trach hook on the anterior abdominal wall fascia for countertension in the left upper abdomen.  Entry was clean.  I induced carbon dioxide insufflation.  Camera inspection revealed no injury.  Extra ports were carefully placed under direct laparoscopic visualization.  We docked the Inituitive Vinci robot carefully and placed intstruments under visualization  I mobilized & reflected the greater omentum and small bowel in the upper abdomen.  I was able to elevate the proximal colon to isolate the ileocolonic pedicle.  I scored the ileal mesentery just proximal to that.   I carried that further dissection in a medial to lateral fashion.  I was able to bluntly get into the retro-mesenteric plane on the right side.  I freed the proximal right sided colonic mesentery off the retroperitoneum including the duodenal sweep, pancreatic head, & Gerota's fascia of the right kidney. I was able to get underneath the  hepatic flexure.  I was able to get underneath the proximal and mid transverse colon.  I isolated the proximal ileocecal pedicle.  I skeletonized it & transected the vessels.    I then proceeded to mobilize the terminal ileum & proximal "right" colon in a lateral  to medial fashion.  I mobilized the distal ileal mesentery off its retroperitoneal and pelvic attachments.  I mobilized the ascending colon off It is side wall attachments to the paracolic gutter and retroperitoneum.  I also mobilized the greater omentum off the mid transverse colon and mobilized the mid to proximal transverse colon in a superior to inferior fashion.  This allowed me to mobilize the hepatic flexure and get a complete mobilization of the proximal "right" colon to the mid-transverse colon..  I could isolate the pathology. When he went ahead and proceeded with transection.  I transected transverse colon mesentery just proximal to a dominant middle colic arterial pedicle radially.   I then transected the distal ileal mesentery and then transected at the distal ileum with a robotic stapler. Transected at the proximal transverse colon with a robotic stapler.  We assured hemostasis.   I did a side-to-side stapled anastomosis of distal ileum to mid-transverse colon using a 67mm robotic stapler x 2 firings in an isoperistaltic fashion.  (Distal stump of ileum to mid transverse colon for the distal end of the anastomosis.  Proximal end of colon stump to more proximal ileum for the proximal end of the anastomosis).  I sewed the common staple channel wound with an absorbable suture ( 2-0 V-lock) in a running [place] fashion from each corner and meeting in the center.  I did meticulous inspection prove an airtight closure.  I protected the anastomosis line with an anterior omentopexy of greater omentum using V lock suture.  We did reinspection of the abdomen.  Hemostasis was good.    Ureters, retroperitoneum, and bowel uninjured.  The anastomosis looked healthy.   We did a final irrigation of antibiotic solution (900 mg clindamycin/240 mg gentamicin in a liter of crystalloid) & held that while we placed the wound protector through the 64mm port site after it was enlarged in a Pfannenstiel fashion.  Specimen  removed without incident.   Endoluminal gas was evacuated.  Ports & wound protector removed.  We changed gloves & redraped the patient per colon SSI prevention protocol.  We aspirated the antibiotic irrigation.  Hemostasis was good.  Sterile unused instruments were used from this point.  I closed the 42mm port sites using Monocryl stitch and sterile dressing.  I closed the extraction wound using a 0 Vicryl vertical peritoneal closure and a #1 PDS transverse anterior rectal fascial closure like a small Pfannenstiel closure. I closed the skin with some interrupted Monocryl stitches. I placed antibiotic-soaked wicks into the closure at the corners x2.  I placed sterile dressings.     Patient is being extubated go to recovery room. I discussed postop care with the patient in detail the office & in the holding area. Instructions are written.  I updated the patient's status to the family.  Recommendations were made.  Questions were answered.  The family expressed understanding & appreciation.  [name], M.D., F.A.C.S. Gastrointestinal and Minimally Invasive Surgery [facility] [address], [place], [place] [address] [phone] Main / Paging

## 2016-01-29 NOTE — Anesthesia Preprocedure Evaluation (Signed)
Anesthesia Evaluation  Patient identified by MRN, date of birth, ID band Patient awake    Reviewed: Allergy & Precautions, H&P , NPO status , Patient's Chart, lab work & pertinent test results  Airway Mallampati: II  TM Distance: >3 FB Neck ROM: full    Dental no notable dental hx. (+) Dental Advisory Given, Teeth Intact   Pulmonary neg pulmonary ROS,    Pulmonary exam normal breath sounds clear to auscultation       Cardiovascular Exercise Tolerance: Good negative cardio ROS Normal cardiovascular exam Rhythm:regular Rate:Normal     Neuro/Psych negative neurological ROS  negative psych ROS   GI/Hepatic negative GI ROS, Neg liver ROS,   Endo/Other  negative endocrine ROSprediabetes  Renal/GU negative Renal ROS  negative genitourinary   Musculoskeletal   Abdominal   Peds  Hematology negative hematology ROS (+)   Anesthesia Other Findings   Reproductive/Obstetrics negative OB ROS                             Anesthesia Physical Anesthesia Plan  ASA: II  Anesthesia Plan: General   Post-op Pain Management:    Induction: Intravenous  Airway Management Planned: Oral ETT  Additional Equipment:   Intra-op Plan:   Post-operative Plan: Extubation in OR  Informed Consent: I have reviewed the patients History and Physical, chart, labs and discussed the procedure including the risks, benefits and alternatives for the proposed anesthesia with the patient or authorized representative who has indicated his/her understanding and acceptance.   Dental Advisory Given  Plan Discussed with: CRNA and Surgeon  Anesthesia Plan Comments:         Anesthesia Quick Evaluation

## 2016-01-29 NOTE — Anesthesia Postprocedure Evaluation (Signed)
Anesthesia Post Note  Patient: Arthur West  Procedure(s) Performed: Procedure(s) (LRB): XI ROBOT ASSISTED LAPAROSCOPIC PROXIMAL RIGHT COLECTOMY (N/A)  Patient location during evaluation: PACU Anesthesia Type: General Level of consciousness: awake and alert Pain management: pain level controlled Vital Signs Assessment: post-procedure vital signs reviewed and stable Respiratory status: spontaneous breathing, nonlabored ventilation, respiratory function stable and patient connected to nasal cannula oxygen Cardiovascular status: blood pressure returned to baseline and stable Postop Assessment: no signs of nausea or vomiting Anesthetic complications: no    Last Vitals:  Filed Vitals:   01/29/16 1230 01/29/16 1247  BP:  124/77  Pulse:  63  Temp: 36.7 C 36.5 C  Resp: 15 15    Last Pain:  Filed Vitals:   01/29/16 1249  PainSc: 5                  Varie Machamer L

## 2016-01-29 NOTE — Transfer of Care (Signed)
Immediate Anesthesia Transfer of Care Note  Patient: Arthur West  Procedure(s) Performed: Procedure(s): XI ROBOT ASSISTED LAPAROSCOPIC PROXIMAL RIGHT COLECTOMY (N/A)  Patient Location: PACU  Anesthesia Type:General  Level of Consciousness: awake, alert , oriented and patient cooperative  Airway & Oxygen Therapy: Patient Spontanous Breathing and Patient connected to face mask oxygen  Post-op Assessment: Report given to RN and Post -op Vital signs reviewed and stable  Post vital signs: Reviewed and stable  Last Vitals:  Filed Vitals:   01/29/16 0532  BP: 143/93  Pulse: 65  Temp: 36.5 C  Resp: 18    Last Pain: There were no vitals filed for this visit.       Complications: No apparent anesthesia complications

## 2016-01-29 NOTE — Interval H&P Note (Signed)
History and Physical Interval Note:  01/29/2016 6:56 AM  Arthur West  has presented today for surgery, with the diagnosis of COLON CANCER  The various methods of treatment have been discussed with the patient and family. After consideration of risks, benefits and other options for treatment, the patient has consented to  Procedure(s): XI [place] (N/A) as a surgical intervention .  The patient's history has been reviewed, patient examined, no change in status, stable for surgery.  I have reviewed the patient's chart and labs.  Questions were answered to the patient's satisfaction.     Khadija Thier C.

## 2016-01-29 NOTE — Anesthesia Procedure Notes (Signed)
Procedure Name: Intubation Date/Time: 01/29/2016 7:31 AM Performed by: Raenette Rover Pre-anesthesia Checklist: Patient identified, Emergency Drugs available, Suction available and Patient being monitored Patient Re-evaluated:Patient Re-evaluated prior to inductionOxygen Delivery Method: Circle system utilized Preoxygenation: Pre-oxygenation with 100% oxygen Intubation Type: IV induction Ventilation: Mask ventilation without difficulty Laryngoscope Size: Miller and 3 Grade View: Grade I Tube type: Oral Tube size: 7.5 mm Number of attempts: 1 Airway Equipment and Method: Stylet Placement Confirmation: CO2 detector,  positive ETCO2,  ETT inserted through vocal cords under direct vision and breath sounds checked- equal and bilateral Secured at: 22 cm Tube secured with: Tape Dental Injury: Teeth and Oropharynx as per pre-operative assessment

## 2016-01-30 LAB — CBC
HEMATOCRIT: 38.1 % — AB (ref 39.0–52.0)
Hemoglobin: 12.4 g/dL — ABNORMAL LOW (ref 13.0–17.0)
MCH: 26.1 pg (ref 26.0–34.0)
MCHC: 32.5 g/dL (ref 30.0–36.0)
MCV: 80 fL (ref 78.0–100.0)
Platelets: 180 10*3/uL (ref 150–400)
RBC: 4.76 MIL/uL (ref 4.22–5.81)
RDW: 13.4 % (ref 11.5–15.5)
WBC: 7.2 10*3/uL (ref 4.0–10.5)

## 2016-01-30 LAB — BASIC METABOLIC PANEL
Anion gap: 4 — ABNORMAL LOW (ref 5–15)
BUN: 14 mg/dL (ref 6–20)
CALCIUM: 8.6 mg/dL — AB (ref 8.9–10.3)
CO2: 29 mmol/L (ref 22–32)
Chloride: 106 mmol/L (ref 101–111)
Creatinine, Ser: 1.14 mg/dL (ref 0.61–1.24)
GFR calc Af Amer: >60 mL/min (ref >60)
GLUCOSE: 109 mg/dL — AB (ref 65–99)
Potassium: 3.9 mmol/L (ref 3.5–5.1)
Sodium: 139 mmol/L (ref 135–145)

## 2016-01-30 LAB — MAGNESIUM: MAGNESIUM: 2 mg/dL (ref 1.7–2.4)

## 2016-01-30 MED ORDER — SODIUM CHLORIDE 0.9 % IV SOLN: 250.0000 mL | INTRAVENOUS | Status: DC | PRN

## 2016-01-30 MED ORDER — LACTATED RINGERS IV BOLUS (SEPSIS): 1000.0000 mL | Freq: Three times a day (TID) | INTRAVENOUS | Status: DC | PRN

## 2016-01-30 MED ORDER — SODIUM CHLORIDE 0.9% FLUSH: 3.0000 mL | INTRAVENOUS | Status: DC | PRN

## 2016-01-30 MED ORDER — SODIUM CHLORIDE 0.9% FLUSH: 3.0000 mL | Freq: Two times a day (BID) | INTRAVENOUS | Status: DC

## 2016-01-30 NOTE — Progress Notes (Signed)
[facility]  [address] [place], [place] [address] Phone: [phone] FAX: [place] [phone] 10/07/1960  CARE TEAM:  PCP: Tommi Rumps, MD  Outpatient Care Team: Patient Care Team: Leone Haven, MD as PCP - General (Family Medicine) Michael Boston, MD as Consulting Physician (General Surgery) Manus Gunning, MD as Consulting Physician (Gastroenterology)  Inpatient Treatment Team: Treatment Team: Attending Provider: Michael Boston, MD; Registered Nurse: Yvette Rack, RN  Problem List:   Principal Problem:   Cancer of ascending colon s/p robotic colectomy 01/29/2016   1 Day Post-Op  01/29/2016  Procedure(s): XI ROBOT ASSISTED LAPAROSCOPIC PROXIMAL RIGHT COLECTOMY   Assessment  Recovering  Plan:  -d/c foley -wean IVF -adv diet per protocol -F/u pathology -VTE prophylaxis- SCDs, etc -mobilize as tolerated to help recovery  D/C patient from hospital when patient meets criteria (anticipate in 1-3 day(s)):  Tolerating oral intake well Ambulating well Adequate pain control without IV medications Urinating  Having flatus Disposition planning in place  I discussed operative findings, updated the patient's status, discussed probable steps to recovery, and gave postoperative recommendations to the patient and spouse.  Recommendations were made.  Questions were answered.  They expressed understanding & appreciation.  [name], M.D., F.A.C.S. Gastrointestinal and Minimally Invasive Surgery [facility] [address], [place], [place] [address] [phone] Main / Paging   01/30/2016  Subjective:  Pain controlled Adv diet Not walking much yet  Objective:  Vital signs:  Filed Vitals:   01/29/16 2201 01/30/16 0100 01/30/16 0626 01/30/16 0627  BP: 106/65 113/67  124/81  Pulse: 50 50  52  Temp: 97.9 F (36.6 C) 98.1 F (36.7 C)   98.2 F (36.8 C)  TempSrc: Oral Oral  Oral  Resp: _0 Height:      Weight:   113.3 kg (249 lb 12.5 oz)   SpO2: 98% 98%  98%    Last BM Date: 01/29/16  Intake/Output   Yesterday:  06/30 0701 - 07/01 0700 In: 4605.8 [P.O.:720; I.V.:3785.8; IV Piggyback:100] Out: 2100 [Urine:2050; Blood:50] This shift:     Bowel function:  Flatus: YES  BM:  No  Drain: (No drain)   Physical Exam:  General: Pt awake/alert/oriented x4 in No acute distress Eyes: PERRL, normal EOM.  Sclera clear.  No icterus Neuro: CN II-XII intact w/o focal sensory/motor deficits. Lymph: No head/neck/groin lymphadenopathy Psych:  No delerium/psychosis/paranoia HENT: Normocephalic, Mucus membranes moist.  No thrush Neck: Supple, No tracheal deviation Chest: No chest wall pain w good excursion CV:  Pulses intact.  Regular rhythm MS: Normal AROM mjr joints.  No obvious deformity Abdomen: Soft.  Nondistended.  Mildly tender at incisions only.  No evidence of peritonitis.  No incarcerated hernias. Ext:  SCDs BLE.  No mjr edema.  No cyanosis Skin: No petechiae / purpura  Results:   Labs: Results for orders placed or performed during the hospital encounter of 01/29/16 (from the past 48 hour(s))  Basic metabolic panel     Status: Abnormal   Collection Time: 01/30/16  4:47 AM  Result Value Ref Range   Sodium 139 135 - 145 mmol/L   Potassium 3.9 3.5 - 5.1 mmol/L   Chloride 106 101 - 111 mmol/L   CO2 29 22 - 32 mmol/L   Glucose, Bld 109 (H) 65 - 99 mg/dL   BUN 14 6 - 20 mg/dL   Creatinine, Ser 1.14 0.61 -  1.24 mg/dL   Calcium 8.6 (L) 8.9 - 10.3 mg/dL   GFR calc non Af Amer >60 >60 mL/min   GFR calc Af Amer >60 >60 mL/min    Comment: (NOTE) The eGFR has been calculated using the CKD EPI equation. This calculation has not been validated in all clinical situations. eGFR's persistently <60 mL/min signify possible Chronic Kidney Disease.    Anion gap 4 (L) 5 - 15  CBC     Status: Abnormal    Collection Time: 01/30/16  4:47 AM  Result Value Ref Range   WBC 7.2 4.0 - 10.5 K/uL   RBC 4.76 4.22 - 5.81 MIL/uL   Hemoglobin 12.4 (L) 13.0 - 17.0 g/dL   HCT 38.1 (L) 39.0 - 52.0 %   MCV 80.0 78.0 - 100.0 fL   MCH 26.1 26.0 - 34.0 pg   MCHC 32.5 30.0 - 36.0 g/dL   RDW 13.4 11.5 - 15.5 %   Platelets 180 150 - 400 K/uL  Magnesium     Status: None   Collection Time: 01/30/16  4:47 AM  Result Value Ref Range   Magnesium 2.0 1.7 - 2.4 mg/dL    Imaging / Studies: No results found.  Medications / Allergies: per chart  Antibiotics: Anti-infectives    Start     Dose/Rate Route Frequency Ordered Stop   01/29/16 1800  cefoTEtan (CEFOTAN) 2 g in dextrose 5 % 50 mL IVPB     2 g 100 mL/hr over 30 Minutes Intravenous Every 12 hours 01/29/16 1300 01/29/16 1831   01/29/16 1049  neomycin (MYCIFRADIN) tablet 1,000 mg  Status:  Discontinued     1,000 mg Oral 3 times per day 01/29/16 1049 01/29/16 1247   01/29/16 1049  metroNIDAZOLE (FLAGYL) tablet 1,000 mg  Status:  Discontinued     1,000 mg Oral 3 times per day 01/29/16 1049 01/29/16 1247   01/29/16 1000  clindamycin (CLEOCIN) 900 mg, gentamicin (GARAMYCIN) 240 mg in sodium chloride 0.9 % 1,000 mL for intraperitoneal lavage  Status:  Discontinued       As needed 01/29/16 1001 01/29/16 1049   01/29/16 0600  clindamycin (CLEOCIN) 900 mg, gentamicin (GARAMYCIN) 240 mg in sodium chloride 0.9 % 1,000 mL for intraperitoneal lavage  Status:  Discontinued    Comments:  Have in the  OR room for final irrigation in bowel surgery case to minimize risk of abscess/infection Pharmacy may adjust dosing strength, schedule, rate of infusion, etc as needed to optimize therapy    Intraperitoneal On call to O.R. 01/28/16 1349 01/29/16 1247   01/29/16 0549  cefoTEtan (CEFOTAN) 2 g in dextrose 5 % 50 mL IVPB     2 g 100 mL/hr over 30 Minutes Intravenous On call to O.R. 01/29/16 0549 01/29/16 0923        Note: Portions of this report may have been transcribed  using voice recognition software. Every effort was made to ensure accuracy; however, inadvertent computerized transcription errors may be present.   Any transcriptional errors that result from this process are unintentional.     [name], M.D., F.A.C.S. Gastrointestinal and Minimally Invasive Surgery [facility] [address], [place],  [address] [phone] Main / Paging   01/30/2016

## 2016-01-30 NOTE — Progress Notes (Signed)
Pharmacy Brief Note - Alvimopan (Entereg)  The standing order set for alvimopan (Entereg) now includes an automatic order to discontinue the drug after the patient has had a bowel movement.  The change was approved by the [place] and the Medical Executive Committee.    This patient has had a bowel movement documented by nursing.  Therefore, alvimopan has been discontinued.  If there are questions, please contact the pharmacy at [phone].  Thank you- Biagio Borg, [facility] 01/30/2016 3:01 PM

## 2016-01-31 MED ORDER — SODIUM CHLORIDE 0.9% FLUSH: 3.0000 mL | INTRAVENOUS | Status: DC | PRN

## 2016-01-31 MED ORDER — METHOCARBAMOL 500 MG PO TABS: 1000.0000 mg | ORAL_TABLET | Freq: Four times a day (QID) | ORAL | Status: DC | PRN

## 2016-01-31 MED ORDER — SODIUM CHLORIDE 0.9 % IV SOLN: 250.0000 mL | INTRAVENOUS | Status: DC | PRN

## 2016-01-31 MED ORDER — OXYCODONE HCL 5 MG PO TABS: 5.0000 mg | ORAL_TABLET | ORAL | Status: DC | PRN

## 2016-01-31 MED ORDER — SODIUM CHLORIDE 0.9% FLUSH: 3.0000 mL | Freq: Two times a day (BID) | INTRAVENOUS | Status: DC

## 2016-01-31 NOTE — Discharge Summary (Signed)
Physician Discharge Summary  Patient ID: Arthur West MRN: [id number] DOB/AGE: February 23, 1961 55 y.o.  Admit date: 01/29/2016 Discharge date: 01/31/2016  Patient Care Team: Leone Haven, MD as PCP - General (Family Medicine) Michael Boston, MD as Consulting Physician (General Surgery) Manus Gunning, MD as Consulting Physician (Gastroenterology)  Admission Diagnoses: Principal Problem:   Cancer of ascending colon s/p robotic colectomy 01/29/2016   Discharge Diagnoses:  Principal Problem:   Cancer of ascending colon s/p robotic colectomy 01/29/2016   POST-OPERATIVE DIAGNOSIS:   COLON CANCER  SURGERY:  01/29/2016  Procedure(s): XI ROBOT ASSISTED LAPAROSCOPIC PROXIMAL RIGHT COLECTOMY  SURGEON:    Surgeon(s): Michael Boston, MD Ralene Ok, MD  Consults: None  Hospital Course:   The patient underwent the surgery above.  Postoperatively, the patient gradually mobilized and advanced to a solid diet.  Pain and other symptoms were treated aggressively.    By the time of discharge, the patient was walking well the hallways, eating food, having flatus.  Pain was well-controlled on an oral medications.  Based on meeting discharge criteria and continuing to recover, I felt it was safe for the patient to be discharged from the hospital to further recover with close followup. Postoperative recommendations were discussed in detail.  They are written as well.   Significant Diagnostic Studies:  Results for orders placed or performed during the hospital encounter of 01/29/16 (from the past 72 hour(s))  Basic metabolic panel     Status: Abnormal   Collection Time: 01/30/16  4:47 AM  Result Value Ref Range   Sodium 139 135 - 145 mmol/L   Potassium 3.9 3.5 - 5.1 mmol/L   Chloride 106 101 - 111 mmol/L   CO2 29 22 - 32 mmol/L   Glucose, Bld 109 (H) 65 - 99 mg/dL   BUN 14 6 - 20 mg/dL   Creatinine, Ser 1.14 0.61 - 1.24 mg/dL   Calcium 8.6 (L) 8.9 - 10.3 mg/dL   GFR calc non  Af Amer >60 >60 mL/min   GFR calc Af Amer >60 >60 mL/min    Comment: (NOTE) The eGFR has been calculated using the CKD EPI equation. This calculation has not been validated in all clinical situations. eGFR's persistently <60 mL/min signify possible Chronic Kidney Disease.    Anion gap 4 (L) 5 - 15  CBC     Status: Abnormal   Collection Time: 01/30/16  4:47 AM  Result Value Ref Range   WBC 7.2 4.0 - 10.5 K/uL   RBC 4.76 4.22 - 5.81 MIL/uL   Hemoglobin 12.4 (L) 13.0 - 17.0 g/dL   HCT 38.1 (L) 39.0 - 52.0 %   MCV 80.0 78.0 - 100.0 fL   MCH 26.1 26.0 - 34.0 pg   MCHC 32.5 30.0 - 36.0 g/dL   RDW 13.4 11.5 - 15.5 %   Platelets 180 150 - 400 K/uL  Magnesium     Status: None   Collection Time: 01/30/16  4:47 AM  Result Value Ref Range   Magnesium 2.0 1.7 - 2.4 mg/dL    No results found.  Discharge Exam: Blood pressure 107/50, pulse 59, temperature 98.3 F (36.8 C), temperature source Oral, resp. rate 18, height 5' 11" (1.803 m), weight 113.1 kg (249 lb 5.4 oz), SpO2 98 %.  General: Pt awake/alert/oriented x4 in no major acute distress Eyes: PERRL, normal EOM. Sclera nonicteric Neuro: CN II-XII intact w/o focal sensory/motor deficits. Lymph: No head/neck/groin lymphadenopathy Psych:  No delerium/psychosis/paranoia HENT: Normocephalic, Mucus membranes moist.  No thrush Neck: Supple, No tracheal deviation Chest: No pain.  Good respiratory excursion. CV:  Pulses intact.  Regular rhythm MS: Normal AROM mjr joints.  No obvious deformity Abdomen: Soft, Nondistended.  Min tender.  Dressings clean.  No incarcerated hernias. Ext:  SCDs BLE.  No significant edema.  No cyanosis Skin: No petechiae / purpura  Discharged Condition: good   Past Medical History  Diagnosis Date  . Arthritis     knees  . Sinus congestion     with pollen   . Allergy   . Cancer ([place])     colon  . Headache     sinus headaches    Past Surgical History  Procedure Laterality Date  . Eye surgery       as child    Social History   Social History  . Marital Status: Married    Spouse Name: N/A  . Number of Children: N/A  . Years of Education: N/A   Occupational History  . Not on file.   Social History Main Topics  . Smoking status: Never Smoker   . Smokeless tobacco: Never Used  . Alcohol Use: 1.2 oz/week    2 Standard drinks or equivalent per week     Comment: 2 beers weekly  . Drug Use: No  . Sexual Activity: Not on file   Other Topics Concern  . Not on file   Social History Narrative    Family History  Problem Relation Age of Onset  . Arthritis    . Breast cancer    . Stroke    . Hypertension    . Heart attack Father 33  . Heart attack Paternal Grandfather 57  . Colon cancer Neg Hx   . Colon polyps Neg Hx   . Esophageal cancer Neg Hx   . Rectal cancer Neg Hx   . Stomach cancer Neg Hx     Current Facility-Administered Medications  Medication Dose Route Frequency Provider Last Rate Last Dose  . 0.9 %  sodium chloride infusion  250 mL Intravenous PRN Michael Boston, MD      . 0.9 %  sodium chloride infusion  250 mL Intravenous PRN Michael Boston, MD      . acetaminophen (TYLENOL) tablet 1,000 mg  1,000 mg Oral TID Michael Boston, MD   1,000 mg at 01/30/16 2240  . alum & mag hydroxide-simeth (MAALOX/MYLANTA) 200-200-20 MG/5ML suspension 30 mL  30 mL Oral Q6H PRN Michael Boston, MD      . aspirin EC tablet 81 mg  81 mg Oral Daily Michael Boston, MD   81 mg at 01/30/16 1018  . diphenhydrAMINE (BENADRYL) 12.5 MG/5ML elixir 12.5 mg  12.5 mg Oral Q6H PRN Michael Boston, MD       Or  . diphenhydrAMINE (BENADRYL) injection 12.5 mg  12.5 mg Intravenous Q6H PRN Michael Boston, MD      . enoxaparin (LOVENOX) injection 40 mg  40 mg Subcutaneous Q24H Michael Boston, MD   40 mg at 01/31/16 0831  . feeding supplement (ENSURE ENLIVE) (ENSURE ENLIVE) liquid 237 mL  237 mL Oral BID BM Michael Boston, MD   237 mL at 01/29/16 1800  . HYDROmorphone (DILAUDID) injection 0.5-2 mg  0.5-2 mg Intravenous  Q1H PRN Michael Boston, MD   1 mg at 01/31/16 8185  . lactated ringers bolus 1,000 mL  1,000 mL Intravenous Q8H PRN Michael Boston, MD   1,000 mL at 01/29/16 1352  . lactated ringers bolus 1,000 mL  1,000  mL Intravenous Q8H PRN Michael Boston, MD      . lip balm (CARMEX) ointment 1 application  1 application Topical BID Michael Boston, MD   1 application at 31/54/00 2240  . magic mouthwash  15 mL Oral QID PRN Michael Boston, MD      . menthol-cetylpyridinium (CEPACOL) lozenge 3 mg  1 lozenge Oral PRN Michael Boston, MD   3 mg at 01/29/16 2057  . methocarbamol (ROBAXIN) 1,000 mg in dextrose 5 % 50 mL IVPB  1,000 mg Intravenous Q6H PRN Michael Boston, MD   1,000 mg at 01/29/16 1200  . methocarbamol (ROBAXIN) tablet 1,000 mg  1,000 mg Oral Q6H PRN Michael Boston, MD      . metoprolol (LOPRESSOR) injection 5 mg  5 mg Intravenous Q6H PRN Michael Boston, MD      . multivitamin with minerals tablet 1 tablet  1 tablet Oral Daily Michael Boston, MD   1 tablet at 01/30/16 1018  . oxyCODONE (Oxy IR/ROXICODONE) immediate release tablet 5-10 mg  5-10 mg Oral Q4H PRN Michael Boston, MD      . phenol [facility]) mouth spray 2 spray  2 spray Mouth/Throat PRN Michael Boston, MD      . prochlorperazine (COMPAZINE) injection 10 mg  10 mg Intravenous Q6H PRN Michael Boston, MD   10 mg at 01/30/16 0902  . saccharomyces boulardii (FLORASTOR) capsule 250 mg  250 mg Oral BID Michael Boston, MD   250 mg at 01/30/16 2240  . sodium chloride flush (NS) 0.9 % injection 3 mL  3 mL Intravenous Q12H Michael Boston, MD      . sodium chloride flush (NS) 0.9 % injection 3 mL  3 mL Intravenous PRN Michael Boston, MD      . sodium chloride flush (NS) 0.9 % injection 3 mL  3 mL Intravenous Q12H Michael Boston, MD      . sodium chloride flush (NS) 0.9 % injection 3 mL  3 mL Intravenous PRN Michael Boston, MD         No Known Allergies  Disposition: Final discharge disposition not confirmed  Discharge Instructions    Call MD for:  extreme fatigue    Complete by:   As directed      Call MD for:  extreme fatigue    Complete by:  As directed      Call MD for:  hives    Complete by:  As directed      Call MD for:  hives    Complete by:  As directed      Call MD for:  persistant nausea and vomiting    Complete by:  As directed      Call MD for:  persistant nausea and vomiting    Complete by:  As directed      Call MD for:  redness, tenderness, or signs of infection (pain, swelling, redness, odor or green/yellow discharge around incision site)    Complete by:  As directed      Call MD for:  redness, tenderness, or signs of infection (pain, swelling, redness, odor or green/yellow discharge around incision site)    Complete by:  As directed      Call MD for:  severe uncontrolled pain    Complete by:  As directed      Call MD for:  severe uncontrolled pain    Complete by:  As directed      Call MD for:    Complete by:  As directed  Temperature > 101.44F     Call MD for:    Complete by:  As directed   Temperature > 101.44F     Diet - low sodium heart healthy    Complete by:  As directed   Start with bland, low residue diet for a few days, then advance to a heart healthy (low fat, high fiber) diet.  If you feel nauseated or constipated, simplify to a liquid only diet for 48 hours until you are feeling better (no more nausea, farting/passing gas, having a bowel movement, etc...).  If you cannot tolerate even drinking liquids, or feeling worse, let your surgeon know or go to the Emergency Department for help.     Diet - low sodium heart healthy    Complete by:  As directed   Start with bland, low residue diet for a few days, then advance to a heart healthy (low fat, high fiber) diet.  If you feel nauseated or constipated, simplify to a liquid only diet for 48 hours until you are feeling better (no more nausea, farting/passing gas, having a bowel movement, etc...).  If you cannot tolerate even drinking liquids, or feeling worse, let your surgeon know or go to  the Emergency Department for help.     Discharge instructions    Complete by:  As directed   Please see discharge instruction sheets.   Also refer to any handouts/printouts that may have been given from the [facility] surgery office (if you visited [place] there before surgery) Please call our office if you have any questions or concerns [phone]     Discharge instructions    Complete by:  As directed   Please see discharge instruction sheets.   Also refer to any handouts/printouts that may have been given from the [facility] surgery office (if you visited [place] there before surgery) Please call our office if you have any questions or concerns [phone]     Discharge wound care:    Complete by:  As directed   If you have closed incisions: Shower and bathe over these incisions with soap and water every day.  It is OK to wash over the dressings: they are waterproof. Remove all surgical dressings on postoperative day #3.  You do not need to replace dressings over the closed incisions unless you feel more comfortable with a Band-Aid covering it.   If you have an open wound: That requires packing, so please see wound care instructions.   In general, remove all dressings, wash wound with soap and water and then replace with saline moistened gauze.  Do the dressing change at least every day.    Please call our office [phone] if you have further questions.     Discharge wound care:    Complete by:  As directed   If you have closed incisions: Shower and bathe over these incisions with soap and water every day.  It is OK to wash over the dressings: they are waterproof. Remove all surgical dressings on postoperative day #3.  You do not need to replace dressings over the closed incisions unless you feel more comfortable with a Band-Aid covering it.   If you have an open wound: That requires packing, so please see wound care instructions.   In general, remove all dressings, wash wound with soap and water  and then replace with saline moistened gauze.  Do the dressing change at least every day.    Please call our office [phone] if you have further  questions.     Driving Restrictions    Complete by:  As directed   No driving until off narcotics and can safely swerve away without pain during an emergency     Driving Restrictions    Complete by:  As directed   No driving until off narcotics and can safely swerve away without pain during an emergency     Increase activity slowly    Complete by:  As directed      Increase activity slowly    Complete by:  As directed      Lifting restrictions    Complete by:  As directed   Avoid heavy lifting initially, <20 pounds at first.   Do not push through pain.   You have no specific weight limit: If it hurts to do, DON'T DO IT.    If you feel no pain, you are not injuring anything.  Pain will protect you from injury.   Coughing and sneezing are far more stressful to your incision than any lifting.   Avoid resuming heavy lifting (>50 pounds) or other intense activity until off all narcotic pain medications.   When want to exercise more, give yourself 2 weeks to gradually get back to full intense exercise/activity.     Lifting restrictions    Complete by:  As directed   Avoid heavy lifting initially, <20 pounds at first.   Do not push through pain.   You have no specific weight limit: If it hurts to do, DON'T DO IT.    If you feel no pain, you are not injuring anything.  Pain will protect you from injury.   Coughing and sneezing are far more stressful to your incision than any lifting.   Avoid resuming heavy lifting (>50 pounds) or other intense activity until off all narcotic pain medications.   When want to exercise more, give yourself 2 weeks to gradually get back to full intense exercise/activity.     May shower / Bathe    Complete by:  As directed   [place].  It is fine for dressings or wounds to be washed/rinsed.  Use gentle soap &  water.  This will help the incisions and/or wounds get clean & minimize infection.     May shower / Bathe    Complete by:  As directed   [place].  It is fine for dressings or wounds to be washed/rinsed.  Use gentle soap & water.  This will help the incisions and/or wounds get clean & minimize infection.     May walk up steps    Complete by:  As directed      May walk up steps    Complete by:  As directed      Sexual Activity Restrictions    Complete by:  As directed   Sexual activity as tolerated.  Do not push through pain.  Pain will protect you from injury.     Sexual Activity Restrictions    Complete by:  As directed   Sexual activity as tolerated.  Do not push through pain.  Pain will protect you from injury.     Walk with assistance    Complete by:  As directed   Walk over an hour a day.  May use a walker/cane/companion to help with balance and stamina.     Walk with assistance    Complete by:  As directed   Walk over an hour a day.  May use a walker/cane/companion  to help with balance and stamina.            Medication List    TAKE these medications        aspirin EC 81 MG tablet  Take 81 mg by mouth daily.     multivitamin with minerals Tabs tablet  Take 1 tablet by mouth daily.     oxyCODONE 5 MG immediate release tablet  Commonly known as:  Oxy IR/ROXICODONE  Take 1-2 tablets (5-10 mg total) by mouth every 6 (six) hours as needed for moderate pain, severe pain or breakthrough pain.           Follow-up Information    Follow up with GROSS,STEVEN C., MD. Schedule an appointment as soon as possible for a visit in 2 weeks.   Specialty:  General Surgery   Why:  To follow up after your operation, To follow up after your hospital stay   Contact information:   [place] [address] [phone]        Signed: Morton Peters, M.D., F.A.C.S. Gastrointestinal and Minimally Invasive Surgery [facility] [address] [place], [place] [address] [phone] Main / Paging   01/31/2016, 10:14 AM

## 2016-01-31 NOTE — Progress Notes (Signed)
Discharged from floor via w/c for transport home by car. Wife & belongings with pt. No changes in assessment. Arthur West   

## 2016-01-31 NOTE — Progress Notes (Signed)
[address] [place], [place] [address] Phone: [phone] FAX: [place] [phone] 1961/04/08  CARE TEAM:  PCP: Tommi Rumps, MD  Outpatient Care Team: Patient Care Team: Leone Haven, MD as PCP - General (Family Medicine) Michael Boston, MD as Consulting Physician (General Surgery) Manus Gunning, MD as Consulting Physician (Gastroenterology)  Inpatient Treatment Team: Treatment Team: Attending Provider: Michael Boston, MD; Registered Nurse: Yvette Rack, RN; Registered Nurse: Nila Nephew, RN  Problem List:   Principal Problem:   Cancer of ascending colon s/p robotic colectomy 01/29/2016   2 Days Post-Op  01/29/2016  Procedure(s): XI ROBOT ASSISTED LAPAROSCOPIC PROXIMAL RIGHT COLECTOMY   Assessment  Recovering  Plan:  -adv diet -PO pain control -F/u pathology -VTE prophylaxis- SCDs, etc -mobilize as tolerated to help recovery  D/C patient from hospital when patient meets criteria (anticipate later today):  Tolerating oral intake well Ambulating well Adequate pain control without IV medications Urinating  Having flatus Disposition planning in place  I discussed operative findings, updated the patient's status, discussed probable steps to recovery, and gave postoperative recommendations to the patient and spouse.  D/w RN.  Recommendations were made.  Questions were answered.  They expressed understanding & appreciation.  [name], M.D., F.A.C.S. Gastrointestinal and Minimally Invasive Surgery [facility] [address] [place], [place] [address] [phone] Main / Paging   01/31/2016  Subjective:  Pain controlled Tolerating PO fulls No nausea now Walking well  Objective:  Vital signs:  Filed Vitals:   01/30/16 1609 01/30/16 2158 01/31/16 0546 01/31/16 0550  BP:  128/73 107/50   Pulse:  83 59   Temp:   98.3 F (36.8 C) 98.3 F (36.8 C)   TempSrc:  Oral Oral   Resp:  18 18   Height:      Weight:    113.1 kg (249 lb 5.4 oz)  SpO2: 95% 98% 98%     Last BM Date: 01/30/16  Intake/Output   Yesterday:  07/01 0701 - 07/02 0700 In: 2321.7 [P.O.:1320; I.V.:1001.7] Out: -  This shift:     Bowel function:  Flatus: YES  BM:  YES  Drain: (No drain)   Physical Exam:  General: Pt awake/alert/oriented x4 in No acute distress Eyes: PERRL, normal EOM.  Sclera clear.  No icterus Neuro: CN II-XII intact w/o focal sensory/motor deficits. Lymph: No head/neck/groin lymphadenopathy Psych:  No delerium/psychosis/paranoia HENT: Normocephalic, Mucus membranes moist.  No thrush Neck: Supple, No tracheal deviation Chest: No chest wall pain w good excursion CV:  Pulses intact.  Regular rhythm MS: Normal AROM mjr joints.  No obvious deformity Abdomen: Soft.  Nondistended.  Mildly tender at incisions only.  No evidence of peritonitis.  No incarcerated hernias. Ext:  SCDs BLE.  No mjr edema.  No cyanosis Skin: No petechiae / purpura  Results:   Labs: Results for orders placed or performed during the hospital encounter of 01/29/16 (from the past 48 hour(s))  Basic metabolic panel     Status: Abnormal   Collection Time: 01/30/16  4:47 AM  Result Value Ref Range   Sodium 139 135 - 145 mmol/L   Potassium 3.9 3.5 - 5.1 mmol/L   Chloride 106 101 - 111 mmol/L   CO2 29 22 - 32 mmol/L   Glucose, Bld 109 (H) 65 - 99 mg/dL   BUN 14 6 - 20 mg/dL   Creatinine, Ser 1.14 0.61 -  1.24 mg/dL   Calcium 8.6 (L) 8.9 - 10.3 mg/dL   GFR calc non Af Amer >60 >60 mL/min   GFR calc Af Amer >60 >60 mL/min    Comment: (NOTE) The eGFR has been calculated using the CKD EPI equation. This calculation has not been validated in all clinical situations. eGFR's persistently <60 mL/min signify possible Chronic Kidney Disease.    Anion gap 4 (L) 5 - 15  CBC     Status: Abnormal   Collection Time: 01/30/16  4:47 AM   Result Value Ref Range   WBC 7.2 4.0 - 10.5 K/uL   RBC 4.76 4.22 - 5.81 MIL/uL   Hemoglobin 12.4 (L) 13.0 - 17.0 g/dL   HCT 38.1 (L) 39.0 - 52.0 %   MCV 80.0 78.0 - 100.0 fL   MCH 26.1 26.0 - 34.0 pg   MCHC 32.5 30.0 - 36.0 g/dL   RDW 13.4 11.5 - 15.5 %   Platelets 180 150 - 400 K/uL  Magnesium     Status: None   Collection Time: 01/30/16  4:47 AM  Result Value Ref Range   Magnesium 2.0 1.7 - 2.4 mg/dL    Imaging / Studies: No results found.  Medications / Allergies: per chart  Antibiotics: Anti-infectives    Start     Dose/Rate Route Frequency Ordered Stop   01/29/16 1800  cefoTEtan (CEFOTAN) 2 g in dextrose 5 % 50 mL IVPB     2 g 100 mL/hr over 30 Minutes Intravenous Every 12 hours 01/29/16 1300 01/29/16 1831   01/29/16 1049  neomycin (MYCIFRADIN) tablet 1,000 mg  Status:  Discontinued     1,000 mg Oral 3 times per day 01/29/16 1049 01/29/16 1247   01/29/16 1049  metroNIDAZOLE (FLAGYL) tablet 1,000 mg  Status:  Discontinued     1,000 mg Oral 3 times per day 01/29/16 1049 01/29/16 1247   01/29/16 1000  clindamycin (CLEOCIN) 900 mg, gentamicin (GARAMYCIN) 240 mg in sodium chloride 0.9 % 1,000 mL for intraperitoneal lavage  Status:  Discontinued       As needed 01/29/16 1001 01/29/16 1049   01/29/16 0600  clindamycin (CLEOCIN) 900 mg, gentamicin (GARAMYCIN) 240 mg in sodium chloride 0.9 % 1,000 mL for intraperitoneal lavage  Status:  Discontinued    Comments:  Have in the  OR room for final irrigation in bowel surgery case to minimize risk of abscess/infection Pharmacy may adjust dosing strength, schedule, rate of infusion, etc as needed to optimize therapy    Intraperitoneal On call to O.R. 01/28/16 1349 01/29/16 1247   01/29/16 0549  cefoTEtan (CEFOTAN) 2 g in dextrose 5 % 50 mL IVPB     2 g 100 mL/hr over 30 Minutes Intravenous On call to O.R. 01/29/16 0549 01/29/16 1121        Note: Portions of this report may have been transcribed using voice recognition software.  Every effort was made to ensure accuracy; however, inadvertent computerized transcription errors may be present.   Any transcriptional errors that result from this process are unintentional.     [name], M.D., F.A.C.S. Gastrointestinal and Minimally Invasive Surgery [facility] [address] [place], [place] [address] [phone] Main / Paging   01/31/2016

## 2016-01-31 NOTE — Discharge Instructions (Signed)
SURGERY: POST OP INSTRUCTIONS (Surgery for small bowel obstruction, colon resection, etc)   ######################################################################  EAT Gradually transition to a high fiber diet with a fiber supplement over the next few days after discharge  WALK Walk an hour a day.  Control your pain to do that.    CONTROL PAIN Control pain so that you can walk, sleep, tolerate sneezing/coughing, go up/down stairs.  HAVE A BOWEL MOVEMENT DAILY Keep your bowels regular to avoid problems.  OK to try a laxative to override constipation.  OK to use an antidairrheal to slow down diarrhea.  Call if not better after 2 tries  CALL IF YOU HAVE PROBLEMS/CONCERNS Call if you are still struggling despite following these instructions. Call if you have concerns not answered by these instructions  ######################################################################   DIET Follow a light diet the first few days at home.  Start with a bland diet such as soups, liquids, starchy foods, low fat foods, etc.  If you feel full, bloated, or constipated, stay on a ful liquid or pureed/blenderized diet for a few days until you feel better and no longer constipated. Be sure to drink plenty of fluids every day to avoid getting dehydrated (feeling dizzy, not urinating, etc.). Gradually add a fiber supplement to your diet over the next week.  Gradually get back to a regular solid diet.  Avoid fast food or heavy meals the first week as you are more likely to get nauseated. It is expected for your digestive tract to need a few months to get back to normal.  It is common for your bowel movements and stools to be irregular.  You will have occasional bloating and cramping that should eventually fade away.  Until you are eating solid food normally, off all pain medications, and back to regular activities; your bowels will not be normal. Focus on eating a low-fat, high fiber diet the rest of your life  (See Getting to [place], below).  CARE of your INCISION or WOUND It is good for closed incision and even open wounds to be washed every day.  Shower every day.  Short baths are fine.  Wash the incisions and wounds clean with soap & water.    REMOVE ALL DRESSINGS AND SKIN WICKS ON Monday 02/01/2016 If you have a closed incision(s), wash the incision with soap & water every day.  You may leave closed incisions open to air if it is dry.   You may cover the incision with clean gauze & replace it after your daily shower for comfort. If you have skin tapes (Steristrips) or skin glue (Dermabond) on your incision, leave them in place.  They will fall off on their own like a scab.  You may trim any edges that curl up with clean scissors.  If you have staples, set up an appointment for them to be removed in the office in 10 days after surgery.    Even closed incisions can have mild bleeding or drainage the first few days until the skin edges scab over & seal.        ACTIVITIES as tolerated Start light daily activities --- self-care, walking, climbing stairs-- beginning the day after surgery.  Gradually increase activities as tolerated.  Control your pain to be active.  Stop when you are tired.  Ideally, walk several times a day, eventually an hour a day.   Most people are back to most day-to-day activities in a few weeks.  It takes 4-8 weeks to get back  to unrestricted, intense activity. If you can walk 30 minutes without difficulty, it is safe to try more intense activity such as jogging, treadmill, bicycling, low-impact aerobics, swimming, etc. Save the most intensive and strenuous activity for last (Usually 4-8 weeks after surgery) such as sit-ups, heavy lifting, contact sports, etc.  Refrain from any intense heavy lifting or straining until you are off narcotics for pain control.  You will have off days, but things should improve week-by-week. DO NOT PUSH THROUGH PAIN.  Let pain be your guide: If  it hurts to do something, don't do it.  Pain is your body warning you to avoid that activity for another week until the pain goes down. You may drive when you are no longer taking narcotic prescription pain medication, you can comfortably wear a seatbelt, and you can safely make sudden turns/stops to protect yourself without hesitating due to pain. You may have sexual intercourse when it is comfortable. If it hurts to do something, stop.  MEDICATIONS Take your usually prescribed home medications unless otherwise directed.   Blood thinners:  Usually you can restart any strong blood thinners after the second postoperative day.  It is OK to take aspirin right away.     If you are on strong blood thinners (warfarin/Coumadin, Plavix, Xerelto, Eliquis, Pradaxa, etc), discuss with your surgeon, medicine PCP, and/or cardiologist for instructions on when to restart the blood thinner & if blood monitoring is needed (PT/INR blood check, etc).     PAIN CONTROL Pain after surgery or related to activity is often due to strain/injury to muscle, tendon, nerves and/or incisions.  This pain is usually short-term and will improve in a few months.  To help speed the process of healing and to get back to regular activity more quickly, DO THE FOLLOWING THINGS TOGETHER: 1. Increase activity gradually.  DO NOT PUSH THROUGH PAIN 2. Use Ice and/or Heat 3. Try Gentle Massage and/or Stretching 4. Take over the counter pain medication 5. Take Narcotic prescription pain medication for more severe pain  Good pain control = faster recovery.  It is better to take more medicine to be more active than to stay in bed all day to avoid medications. 1.  Increase activity gradually Avoid heavy lifting at first, then increase to lifting as tolerated over the next 6 weeks. Do not push through the pain.  Listen to your body and avoid positions and maneuvers than reproduce the pain.  Wait a few days before trying something more  intense Walking an hour a day is encouraged to help your body recover faster and more safely.  Start slowly and stop when getting sore.  If you can walk 30 minutes without stopping or pain, you can try more intense activity (running, jogging, aerobics, cycling, swimming, treadmill, sex, sports, weightlifting, etc.) Remember: If it hurts to do it, then dont do it! 2. Use Ice and/or Heat You will have swelling and bruising around the incisions.  This will take several weeks to resolve. Ice packs or heating pads (6-8 times a day, 30-60 minutes at a time) will help sooth soreness & bruising. Some people prefer to use ice alone, heat alone, or alternate between ice & heat.  Experiment and see what works best for you.  Consider trying ice for the first few days to help decrease swelling and bruising; then, switch to heat to help relax sore spots and speed recovery. Shower every day.  Short baths are fine.  It feels good!  Keep the incisions  and wounds clean with soap & water.   3. Try Gentle Massage and/or Stretching Massage at the area of pain many times a day Stop if you feel pain - do not overdo it 4. Take over the counter pain medication This helps the muscle and nerve tissues become less irritable and calm down faster Choose ONE of the following over-the-counter anti-inflammatory medications: Acetaminophen 500mg  tabs (Tylenol) 1-2 pills with every meal and just before bedtime (avoid if you have liver problems or if you have acetaminophen in you narcotic prescription) Naproxen 220mg  tabs (ex. Aleve, Naprosyn) 1-2 pills twice a day (avoid if you have kidney, stomach, IBD, or bleeding problems) Ibuprofen 200mg  tabs (ex. Advil, Motrin) 3-4 pills with every meal and just before bedtime (avoid if you have kidney, stomach, IBD, or bleeding problems) Take with food/snack several times a day as directed for at least 2 weeks to help keep pain / soreness down & more manageable. 5. Take Narcotic prescription  pain medication for more severe pain A prescription for strong pain control is often given to you upon discharge (for example: oxycodone/Percocet, hydrocodone/Norco/Vicodin, or tramadol/Ultram) Take your pain medication as prescribed. Be mindful that most narcotic prescriptions contain Tylenol (acetaminophen) as well - avoid taking too much Tylenol. If you are having problems/concerns with the prescription medicine (does not control pain, nausea, vomiting, rash, itching, etc.), please call us [phone] to see if we need to switch you to a different pain medicine that will work better for you and/or control your side effects better. If you need a refill on your pain medication, you must call the office before 4 pm and on weekdays only.  By federal law, prescriptions for narcotics cannot be called into a pharmacy.  They must be filled out on paper & picked up from our office by the patient or authorized caretaker.  Prescriptions cannot be filled after 4 pm nor on weekends.    WHEN TO CALL us [phone] Severe uncontrolled or worsening pain  Fever over 101 F (38.5 C) Concerns with the incision: Worsening pain, redness, rash/hives, swelling, bleeding, or drainage Reactions / problems with new medications (itching, rash, hives, nausea, etc.) Nausea and/or vomiting Difficulty urinating Difficulty breathing Worsening fatigue, dizziness, lightheadedness, blurred vision Other concerns If you are not getting better after two weeks or are noticing you are getting worse, contact our office [phone] for further advice.  We may need to adjust your medications, re-evaluate you in the office, send you to the emergency room, or see what other things we can do to help. The clinic staff is available to answer your questions during regular business hours (8:30am-5pm).  Please dont hesitate to call and ask to speak to one of our nurses for clinical concerns.    A surgeon from [facility]  is always on call at the hospitals 24 hours/day If you have a medical emergency, go to the nearest emergency room or call 911.  FOLLOW UP in our office One the day of your discharge from the hospital (or the next business weekday), please call [facility] to set up or confirm an appointment to see your surgeon in the office for a follow-up appointment.  Usually it is 2-3 weeks after your surgery.   If you have skin staples at your incision(s), let the office know so we can set up a time in the office for the nurse to remove them (usually around 10 days after surgery). Make sure that you call for  appointments the day of discharge (or the next business weekday) from the hospital to ensure a convenient appointment time. IF YOU HAVE DISABILITY OR FAMILY LEAVE FORMS, BRING THEM TO THE OFFICE FOR PROCESSING.  DO NOT GIVE THEM TO YOUR DOCTOR.  [facility] [address] [place], [place], [place]  [address] ? [phone] - Main [phone] - [place],  [phone] - Fax [email or web address]  GETTING TO GOOD BOWEL HEALTH. It is expected for your digestive tract to need a few months to get back to normal.  It is common for your bowel movements and stools to be irregular.  You will have occasional bloating and cramping that should eventually fade away.  Until you are eating solid food normally, off all pain medications, and back to regular activities; your bowels will not be normal.   Avoiding constipation The goal: ONE SOFT BOWEL MOVEMENT A DAY!    Drink plenty of fluids.  Choose water first. TAKE A FIBER SUPPLEMENT EVERY DAY THE REST OF YOUR LIFE During your first week back home, gradually add back a fiber supplement every day Experiment which form you can tolerate.   There are many forms such as powders, tablets, wafers, gummies, etc Psyllium bran (Metamucil), methylcellulose (Citrucel), Miralax or Glycolax, Benefiber, Flax Seed.  Adjust the  dose week-by-week (1/2 dose/day to 6 doses a day) until you are moving your bowels 1-2 times a day.  Cut back the dose or try a different fiber product if it is giving you problems such as diarrhea or bloating. Sometimes a laxative is needed to help jump-start bowels if constipated until the fiber supplement can help regulate your bowels.  If you are tolerating eating & you are farting, it is okay to try a gentle laxative such as double dose MiraLax, prune juice, or Milk of Magnesia.  Avoid using laxatives too often. Stool softeners can sometimes help counteract the constipating effects of narcotic pain medicines.  It can also cause diarrhea, so avoid using for too long. If you are still constipated despite taking fiber daily, eating solids, and a few doses of laxatives, call our office. Controlling diarrhea Try drinking liquids and eating bland foods for a few days to avoid stressing your intestines further. Avoid dairy products (especially milk & ice cream) for a short time.  The intestines often can lose the ability to digest lactose when stressed. Avoid foods that cause gassiness or bloating.  Typical foods include beans and other legumes, cabbage, broccoli, and dairy foods.  Avoid greasy, spicy, fast foods.  Every person has some sensitivity to other foods, so listen to your body and avoid those foods that trigger problems for you. Probiotics (such as active yogurt, Align, etc) may help repopulate the intestines and colon with normal bacteria and calm down a sensitive digestive tract Adding a fiber supplement gradually can help thicken stools by absorbing excess fluid and retrain the intestines to act more normally.  Slowly increase the dose over a few weeks.  Too much fiber too soon can backfire and cause cramping & bloating. It is okay to try and slow down diarrhea with a few doses of antidiarrheal medicines.   Bismuth subsalicylate (ex. Kayopectate, Pepto Bismol) for a few doses can help control  diarrhea.  Avoid if pregnant.   Loperamide (Imodium) can slow down diarrhea.  Start with one tablet (2mg ) first.  Avoid if you are having fevers or severe pain.  ILEOSTOMY PATIENTS WILL HAVE CHRONIC DIARRHEA since their colon is  not in use.    Drink plenty of liquids.  You will need to drink even more glasses of water/liquid a day to avoid getting dehydrated. Record output from your ileostomy.  Expect to empty the bag every 3-4 hours at first.  Most people with a permanent ileostomy empty their bag 4-6 times at the least.   Use antidiarrheal medicine (especially Imodium) several times a day to avoid getting dehydrated.  Start with a dose at bedtime & breakfast.  Adjust up or down as needed.  Increase antidiarrheal medications as directed to avoid emptying the bag more than 8 times a day (every 3 hours). Work with your wound ostomy nurse to learn care for your ostomy.  See ostomy care instructions. TROUBLESHOOTING IRREGULAR BOWELS 1) Start with a soft & bland diet. No spicy, greasy, or fried foods.  2) Avoid gluten/wheat or dairy products from diet to see if symptoms improve. 3) Miralax 17gm or flax seed mixed in [place]. water or juice-daily. May use 2-4 times a day as needed. 4) Gas-X, Phazyme, etc. as needed for gas & bloating.  5) Prilosec (omeprazole) over-the-counter as needed 6)  Consider probiotics (Align, Activa, etc) to help calm the bowels down  Call your doctor if you are getting worse or not getting better.  Sometimes further testing (cultures, endoscopy, X-ray studies, CT scans, bloodwork, etc.) may be needed to help diagnose and treat the cause of the diarrhea. [facility], [place], [place], [place], [place]  [address] [phone] - Main.    [phone]  - Toll Free.   [phone] - Fax [email or web address]

## 2016-02-08 ENCOUNTER — Telehealth: Payer: Self-pay | Admitting: *Deleted

## 2016-02-08 NOTE — Telephone Encounter (Signed)
Oncology Nurse Navigator Documentation  Oncology Nurse Navigator Flowsheets 02/08/2016  Navigator Location [facility]  Navigator Encounter Type Introductory phone call  Abnormal Finding Date 01/07/2016  Confirmed Diagnosis Date 01/07/2016  Surgery Date 01/29/2016  Spoke with patient and provided new patient appointment for 02/16/16 at 1045/1100 with Dr. Truitt Merle. Informed of location of [facility], valet service, and registration process. Reminded to bring insurance cards and a current medication list, including supplements. Patient verbalizes understanding. Notified HIM to enter appointment into EPIC.

## 2016-02-09 ENCOUNTER — Encounter (HOSPITAL_COMMUNITY): Payer: Self-pay

## 2016-02-16 ENCOUNTER — Encounter: Payer: Self-pay | Admitting: Hematology

## 2016-02-16 ENCOUNTER — Telehealth: Payer: Self-pay | Admitting: Hematology

## 2016-02-16 ENCOUNTER — Encounter: Payer: Self-pay | Admitting: *Deleted

## 2016-02-16 ENCOUNTER — Ambulatory Visit (HOSPITAL_BASED_OUTPATIENT_CLINIC_OR_DEPARTMENT_OTHER): Payer: BLUE CROSS/BLUE SHIELD | Admitting: Hematology

## 2016-02-16 VITALS — BP 121/79 | HR 82 | Temp 98.6°F | Resp 18 | Ht 71.0 in | Wt 233.5 lb

## 2016-02-16 DIAGNOSIS — C182 Malignant neoplasm of ascending colon: Secondary | ICD-10-CM | POA: Diagnosis not present

## 2016-02-16 NOTE — Progress Notes (Signed)
[place]  [phone] Fax:([phone]) [place] Note   Patient Care Team: Leone Haven, MD as PCP - General (Family Medicine) Michael Boston, MD as Consulting Physician (General Surgery) Manus Gunning, MD as Consulting Physician (Gastroenterology) 02/16/2016  Referring physician: Dr. Johney Maine  CHIEF COMPLAINTS/PURPOSE OF CONSULTATION:  Newly diagnosed colon cancer  Oncology History   Noted on screening colonoscopy  Cancer of ascending colon s/p robotic colectomy 01/29/2016   Staging form: Colon and Rectum, AJCC 7th Edition     Pathologic stage from 01/19/2016: Stage IIA (T3, N0, cM0) - Signed by Truitt Merle, MD on 02/16/2016       Cancer of ascending colon s/p robotic colectomy 01/29/2016   01/07/2016 Procedure COLONOSCOPY: large polypoid lesion in mid ascending colon -sessile and behind a fold and several polyps per Dr. Havery Moros   01/12/2016 Imaging CT C/A/P: Colon wall thickening-ascending and right mesenteric node; small indeterminat liver lesions; #2 pulmonary nodules   01/15/2016 Imaging MRI Liver: Liver cysts and a hemangioma   01/19/2016 Surgery Robot assist proximal right colectomy per Dr. Johney Maine   01/29/2016 Initial Diagnosis Cancer of ascending colon s/p robotic colectomy 01/29/2016   01/29/2016 Pathologic Stage pT3, pN0, PMX--Grade 2   01/29/2016 Pathology Results Adenocarcinoma, IHC normal; MSI stable     HISTORY OF PRESENTING ILLNESS:  Arthur West 55 y.o. male is here because of His recently diagnosed stage II colon cancer. He presents to the clinic with his wife.  He had his first screening colonoscopy on 01/07/2016, which showed a large polypoid lesion in mid ascending colon, and additional multiple polyps. The biopsy of the lesion in the mid ascending colon showed adenocarcinoma. He feels well, denies any symptoms, such as pain, bloating, nausea, change of his bowel habits, hematochezia or melanoma. No recent weight  loss. He was referred to colorectal surgeon Dr. Johney Maine and underwent robotic-assisted proximal right hemicolectomy. He tolerated surgery very well, was discharged home 2 days after his surgery.  He has recovered well, has mild residual abdominal pain, his bowel movement is normal. His appetite and energy level has recovered well, he tolerates routine activities well without difficulties, no other complaints.  MEDICAL HISTORY:  Past Medical History  Diagnosis Date  . Arthritis     knees  . Sinus congestion     with pollen   . Allergy   . Cancer ([place])     colon  . Headache     sinus headaches    SURGICAL HISTORY: Past Surgical History  Procedure Laterality Date  . Eye surgery      as child    SOCIAL HISTORY: Social History   Social History  . Marital Status: Married    Spouse Name: N/A  . Number of Children: N/A  . Years of Education: N/A   Occupational History  . Not on file.   Social History Main Topics  . Smoking status: Never Smoker   . Smokeless tobacco: Never Used  . Alcohol Use: 1.2 oz/week    2 Standard drinks or equivalent per week     Comment: 2 beers weekly  . Drug Use: No  . Sexual Activity: Not on file   Other Topics Concern  . Not on file   Social History Narrative   He is married, with 3 children 42, 1, 73     FAMILY HISTORY: Family History  Problem Relation Age of Onset  . Arthritis    . Breast cancer    .  Stroke    . Hypertension    . Heart attack Father 14  . Heart attack Paternal Grandfather 36  . Colon cancer Neg Hx   . Colon polyps Neg Hx   . Esophageal cancer Neg Hx   . Rectal cancer Neg Hx   . Stomach cancer Neg Hx     ALLERGIES:  has No Known Allergies.  MEDICATIONS:  Current Outpatient Prescriptions  Medication Sig Dispense Refill  . aspirin EC 81 MG tablet Take 81 mg by mouth daily.    . Multiple Vitamin (MULTIVITAMIN WITH MINERALS) TABS tablet Take 1 tablet by mouth daily.    Marland Kitchen oxyCODONE (OXY IR/ROXICODONE) 5 MG  immediate release tablet Take 1-2 tablets (5-10 mg total) by mouth every 6 (six) hours as needed for moderate pain, severe pain or breakthrough pain. 30 tablet 0   No current facility-administered medications for this visit.    REVIEW OF SYSTEMS:   Constitutional: Denies fevers, chills or abnormal night sweats Eyes: Denies blurriness of vision, double vision or watery eyes Ears, nose, mouth, throat, and face: Denies mucositis or sore throat Respiratory: Denies cough, dyspnea or wheezes Cardiovascular: Denies palpitation, chest discomfort or lower extremity swelling Gastrointestinal:  Denies nausea, heartburn or change in bowel habits, mild residual abdominal pain from the surgery. Skin: Denies abnormal skin rashes Lymphatics: Denies new lymphadenopathy or easy bruising Neurological:Denies numbness, tingling or new weaknesses Behavioral/Psych: Mood is stable, no new changes  All other systems were reviewed with the patient and are negative.  PHYSICAL EXAMINATION: ECOG PERFORMANCE STATUS: 0 - Asymptomatic  Filed Vitals:   02/16/16 1108  BP: 121/79  Pulse: 82  Temp: 98.6 F (37 C)  Resp: 18   Filed Weights   02/16/16 1108  Weight: 233 lb 8 oz (105.915 kg)    GENERAL:alert, no distress and comfortable SKIN: skin color, texture, turgor are normal, no rashes or significant lesions EYES: normal, conjunctiva are pink and non-injected, sclera clear OROPHARYNX:no exudate, no erythema and lips, buccal mucosa, and tongue normal  NECK: supple, thyroid normal size, non-tender, without nodularity LYMPH:  no palpable lymphadenopathy in the cervical, axillary or inguinal LUNGS: clear to auscultation and percussion with normal breathing effort HEART: regular rate & rhythm and no murmurs and no lower extremity edema ABDOMEN:abdomen soft, non-tender and normal bowel sounds. The laparoscopic incision sites have healed well. Musculoskeletal:no cyanosis of digits and no clubbing  PSYCH: alert &  oriented x 3 with fluent speech NEURO: no focal motor/sensory deficits  LABORATORY DATA:  I have reviewed the data as listed CBC Latest Ref Rng 01/30/2016 01/22/2016 01/11/2016  WBC 4.0 - 10.5 K/uL 7.2 5.4 6.8  Hemoglobin 13.0 - 17.0 g/dL 12.4(L) 15.3 15.1  Hematocrit 39.0 - 52.0 % 38.1(L) 45.3 46.0  Platelets 150 - 400 K/uL 180 183 226.0   CMP Latest Ref Rng 01/30/2016 01/11/2016 11/23/2015  Glucose 65 - 99 mg/dL 109(H) 96 138(H)  BUN 6 - 20 mg/dL 14 24(H) 16  Creatinine 0.61 - 1.24 mg/dL 1.14 1.20 1.10  Sodium 135 - 145 mmol/L 139 140 141  Potassium 3.5 - 5.1 mmol/L 3.9 3.8 3.9  Chloride 101 - 111 mmol/L 106 104 104  CO2 22 - 32 mmol/L _0 Calcium 8.9 - 10.3 mg/dL 8.6(L) 9.5 9.5  Total Protein 6.0 - 8.3 g/dL - 7.4 7.2  Total Bilirubin 0.2 - 1.2 mg/dL - 0.8 0.6  Alkaline Phos 39 - 117 U/L - 57 73  AST 0 - 37 U/L - 19 20  ALT 0 - 53 U/L - 22 25    PATHOLOGY REPORT  Diagnosis 01/29/2016 Colon, segmental resection for tumor, proximal right colon - COLONIC ADENOCARCINOMA, 2.6 CM. - TUMOR FOCALLY EXTENDS INTO PERICOLONIC CONNECTIVE TISSUE. - MARGINS NOT INVOLVED. - TWENTY-FOUR BENIGN LYMPH NODES (0/24). Microscopic Comment COLON AND RECTUM (INCLUDING TRANS-ANAL RESECTION): Specimen: Right colon Procedure: Segmental resection Tumor site: Proximal ascending colon Specimen integrity: Intact Macroscopic intactness of mesorectum: N/A Macroscopic tumor perforation: No Invasive tumor: Maximum size: 2.6 cm Histologic type(s): Colorectal adenocarcinoma Histologic grade and differentiation: G2: moderately differentiated/low grade Type of polyp in which invasive carcinoma arose: No residual polyp Microscopic extension of invasive tumor: Focally into pericolonic connective tissue Lymph-Vascular invasion: Not identified Peri-neural invasion: No Tumor deposit(s) (discontinuous extramural extension): No Resection margins: Proximal margin: Free of tumor Distal margin: Free of  tumor Circumferential (radial) (posterior ascending, posterior descending; lateral and posterior mid-rectum; and entire lower 1/3 rectum): Free of tumor Mesenteric margin (sigmoid and transverse): N/A Distance closest margin (if all above margins negative): 9.5 cm from proximal margin Treatment effect (neo-adjuvant therapy): No Additional polyp(s): No Non-neoplastic findings: Unremarkable appendix Lymph nodes: number examined 24; number positive: 0 Pathologic Staging: pT3, pN0, pMX Ancillary studies: Mismatch repair protein by immunohistochemistry and microsatellite instability by PCR. (JDP:kh 02-01-16)  Mismatch Repair (MMR) Protein Immunohistochemistry (IHC) IHC Expression Result: MLH1: Preserved nuclear expression (greater 50% tumor expression) MSH2: Preserved nuclear expression (greater 50% tumor expression) MSH6: Preserved nuclear expression (greater 50% tumor expression) PMS2: Preserved nuclear expression (greater 50% tumor expression) * Internal control demonstrates intact nuclear expression Interpretation: NORMAL  MSI-stable   RADIOGRAPHIC STUDIES: I have personally reviewed the radiological images as listed and agreed with the findings in the report.  CT chest, abdomen and pelvis with contrast 01/12/2016 IMPRESSION: 1. Medial mid ascending colon 2.0 x 0.9 cm focus of wall thickening, which could correlate with the known primary ascending colon malignancy. 2. Solitary mildly enlarged right mesenteric node, cannot exclude nodal metastasis. 3. Low-attenuation 1.0 cm right liver lobe lesion with indeterminate density, liver metastasis not excluded. Two additional subcentimeter low-attenuation right liver lobe lesions, too small to characterize. Liver protocol MRI abdomen without with IV contrast is recommended for further characterization. These liver lesions are probably too small for characterization by PET-CT. 4. Two pulmonary nodules in the right upper lobe, largest 5  mm, probably benign given subpleural upper lung location. Recommend attention on follow-up chest CT in 3-6 months. 5. Faint patchy centrilobular ground-glass nodularity in both lungs. If the patient is a nonsmoker (as per EPIC), this finding is suggestive of subacute hypersensitivity pneumonitis. Recommend clinical correlation for any history of inhalational exposure. 6. Mildly enlarged prostate.  Liver MRI w wo contrast 01/15/2016 IMPRESSION: 1. No evidence of hepatic metastasis. Multiple hepatic cysts, including the indeterminate right liver lobe lesion on CT. There is also a segment 7 hepatic hemangioma. 2. Equivocal ileocolic mesenteric nodes, as before.  Colonoscopy 01/07/2016 - One 3 mm polyp in the cecum, removed with a cold snare. Resected and retrieved. - One 6 mm polyp in the ascending colon, removed with a cold snare. Resected and retrieved. - One 8 mm polyp in the ascending colon, removed with a hot snare. Resected and retrieved. - One 8 mm polyp in the transverse colon, removed with a hot snare. Resected and retrieved. - One 3 mm polyp in the transverse colon, removed with a cold snare. Resected and retrieved. - Rule out malignancy, polypoid lesion in the mid ascending colon. Biopsied. Tattooed. - The examination  was otherwise normal on direct and retroflexion views.   ASSESSMENT & PLAN:  55 year old African-American male, found to have a right ascending colon mass on his first screening colonoscopy.  1. Cancer of ascending colon, invasive adenocarcinoma, grade 2, pT3N0M0, stage IIA, MSI-stable -I reviewed his CT scan findings, and surgical pathology results in great details with patient and his wife. -He has early-stage disease, node negative, no high risk features, such as T4 lesion, lymphovascular invasion, obstruction, perforation, etc. His risk of recurrence is low to moderate.  -We discussed that the benefit of adjuvant chemotherapy is  controversial in stage II colon  cancer without high risk features and I do not recommend it.  -I discussed the risk of cancer recurrence in the future. I discussed the surveillance plan, which consists physical exam and lab test (including CBC, CMP and CEA) every 3-4 months for the first 2-3 years, then every 6-12 months for a total of 5 years. He would need repeated colonoscopy in one year, and surveilliance CT scan every 6-12 month for up to 5 year.  -We finally also discussed cancer prevention. I encouraged him to have healthy diet, with more fresh vegetables and fruits, less red meat. I strongly encouraged him to exercise regularly. There also clinical trial data support using aspirin and vitamin D to reduce her risk of colon cancer. He has been on aspirin and multivitamins, I encouraged him to continue.  2. Genetics -We discussed some of colon cancer are inheritable, such as Lynch syndrome. -His colon cancer has MSI stable, this is unlikely range syndrome. -He does have family history of breast and ovarian cancer, and colon polyps, I will ask our genetic consoler to see if he meets criteria for genetic testing. He is interested  -I encouraged his children to start screening colonoscopy at age of 46, 103 years younger than him.   Recommendations: Based on information available as of today's consult. Recommendations may change depending on the results of further tests or exams. 1) No chemotherapy is indicated (no high risk features). 1 year colonoscopy and CT scan 2) Eat healthy (less red meat) and exercise. 3) Children need screening in their 40's Next Steps: 1) Resume ASA and Vitamin D-can reduce risk of cancer 2) Genetics counselor appointment 3) Return to clinic for follow up in 3 months    All questions were answered. The patient knows to call the clinic with any problems, questions or concerns. I spent 55 minutes counseling the patient face to face. The total time spent in the appointment was 60 minutes and more than  50% was on counseling.     Truitt Merle, MD 02/16/2016 7:50 AM

## 2016-02-16 NOTE — Telephone Encounter (Signed)
Gave pt cal & avs °

## 2016-02-16 NOTE — Progress Notes (Signed)
Oncology Nurse Navigator Documentation  Oncology Nurse Navigator Flowsheets 02/16/2016  Navigator Location [facility]  Navigator Encounter Type Initial MedOnc  Abnormal Finding Date -  Confirmed Diagnosis Date -  Surgery Date -  Treatment Initiated Date (No Data)--no tx indicated  Patient Visit Type MedOnc;Initial  Treatment Phase Survivorship  Barriers/Navigation Needs Education--  Education Newly Diagnosed Cancer Education;Other  Support Groups/Services [employer]-Personal Health Manager;GI Support Group  Acuity Level 1  Time Spent with Patient 20  Met with patient and wife, Arthur West during new patient visit. Explained the role of the GI Nurse Navigator and provided New Patient Packet with information on: 1. Colon cancer--exercise handout 2. Support groups 3. Advanced Directives 4. Fall Safety Plan Answered questions, reviewed current treatment plan using TEACH back and provided emotional support. Provided copy of current treatment plan.   Arthur Elks, RN, BSN GI Oncology [place]

## 2016-02-16 NOTE — Patient Instructions (Signed)
Name: Arthur West         DOB: 03-25-1961   Your Medical Team: Medical Oncologist:  Dr. Truitt Merle Radiation Oncologist:   Surgeon:    Dr. Michael Boston Type of Cancer: Adenocarcinoma of Colon  Stage/Grade: Stage II *Exact staging of your cancer is based on size of the tumor, depth of invasion, involvement of lymph nodes or not, and whether or not the cancer has spread beyond the primary site.   Recommendations: Based on information available as of today's consult. Recommendations may change depending on the results of further tests or exams. 1) No chemotherapy is indicated (no high risk features). 1 year colonoscopy and CT scan 2) Eat healthy (less red meat) and exercise. 3) Children need screening in their 40's Next Steps: 1) Resume ASA and Vitamin D-can reduce risk of cancer 2) Genetics counselor appointment 3) Return to Dr. Burr Medico as scheduled

## 2016-02-17 ENCOUNTER — Encounter: Payer: Self-pay | Admitting: Hematology

## 2016-02-18 NOTE — Addendum Note (Signed)
Addended by: Truitt Merle on: 02/18/2016 10:24 AM   Modules accepted: Orders

## 2016-02-19 ENCOUNTER — Telehealth: Payer: Self-pay | Admitting: Hematology

## 2016-02-19 NOTE — Telephone Encounter (Signed)
per pof to sch pt appt-cld pt and gave pt appt time & date of appt on 8/24 @10 

## 2016-03-23 ENCOUNTER — Encounter: Payer: Self-pay | Admitting: Genetic Counselor

## 2016-03-24 ENCOUNTER — Other Ambulatory Visit: Payer: BLUE CROSS/BLUE SHIELD

## 2016-03-24 ENCOUNTER — Encounter: Payer: BLUE CROSS/BLUE SHIELD | Admitting: Genetic Counselor

## 2016-05-17 ENCOUNTER — Other Ambulatory Visit (HOSPITAL_BASED_OUTPATIENT_CLINIC_OR_DEPARTMENT_OTHER): Payer: BLUE CROSS/BLUE SHIELD

## 2016-05-17 ENCOUNTER — Ambulatory Visit (HOSPITAL_BASED_OUTPATIENT_CLINIC_OR_DEPARTMENT_OTHER): Payer: BLUE CROSS/BLUE SHIELD | Admitting: Hematology

## 2016-05-17 ENCOUNTER — Encounter: Payer: Self-pay | Admitting: Hematology

## 2016-05-17 ENCOUNTER — Telehealth: Payer: Self-pay | Admitting: Hematology

## 2016-05-17 DIAGNOSIS — C182 Malignant neoplasm of ascending colon: Secondary | ICD-10-CM

## 2016-05-17 LAB — CBC WITH DIFFERENTIAL/PLATELET
BASO%: 0.2 % (ref 0.0–2.0)
BASOS ABS: 0 10*3/uL (ref 0.0–0.1)
EOS%: 3.2 % (ref 0.0–7.0)
Eosinophils Absolute: 0.2 10*3/uL (ref 0.0–0.5)
HCT: 45.4 % (ref 38.4–49.9)
HGB: 14.8 g/dL (ref 13.0–17.1)
LYMPH%: 37.3 % (ref 14.0–49.0)
MCH: 24.9 pg — AB (ref 27.2–33.4)
MCHC: 32.6 g/dL (ref 32.0–36.0)
MCV: 76.4 fL — AB (ref 79.3–98.0)
MONO#: 0.4 10*3/uL (ref 0.1–0.9)
MONO%: 6.7 % (ref 0.0–14.0)
NEUT#: 3.5 10*3/uL (ref 1.5–6.5)
NEUT%: 52.6 % (ref 39.0–75.0)
PLATELETS: 189 10*3/uL (ref 140–400)
RBC: 5.94 10*6/uL — AB (ref 4.20–5.82)
RDW: 13.8 % (ref 11.0–14.6)
WBC: 6.6 10*3/uL (ref 4.0–10.3)
lymph#: 2.4 10*3/uL (ref 0.9–3.3)

## 2016-05-17 LAB — COMPREHENSIVE METABOLIC PANEL
ALT: 20 U/L (ref 0–55)
ANION GAP: 11 meq/L (ref 3–11)
AST: 22 U/L (ref 5–34)
Albumin: 3.8 g/dL (ref 3.5–5.0)
Alkaline Phosphatase: 72 U/L (ref 40–150)
BUN: 19.2 mg/dL (ref 7.0–26.0)
CHLORIDE: 106 meq/L (ref 98–109)
CO2: 24 meq/L (ref 22–29)
Calcium: 9.6 mg/dL (ref 8.4–10.4)
Creatinine: 1.2 mg/dL (ref 0.7–1.3)
EGFR: 70 mL/min/{1.73_m2} — AB (ref >90)
Glucose: 100 mg/dl (ref 70–140)
POTASSIUM: 3.8 meq/L (ref 3.5–5.1)
Sodium: 142 mEq/L (ref 136–145)
Total Bilirubin: 0.44 mg/dL (ref 0.20–1.20)
Total Protein: 7.6 g/dL (ref 6.4–8.3)

## 2016-05-17 LAB — CEA (IN HOUSE-CHCC): CEA (CHCC-IN HOUSE): 1.38 ng/mL (ref 0.00–5.00)

## 2016-05-17 NOTE — Progress Notes (Signed)
[facility]  [phone]) [phone] [phone]  Clinic Follow up Note   Patient Care Team: Arthur Haven, MD as PCP - General (Family Medicine) Arthur Boston, MD as Consulting Physician (General Surgery) Arthur Gunning, MD as Consulting Physician (Gastroenterology) 05/17/2016   CHIEF COMPLAINTS:  Follow up colon cancer  Oncology History   Noted on screening colonoscopy  Cancer of ascending colon s/p robotic colectomy 01/29/2016   Staging form: Colon and Rectum, AJCC 7th Edition     Pathologic stage from 01/19/2016: Stage IIA (T3, N0, cM0) - Signed by Arthur Merle, MD on 02/16/2016       Cancer of ascending colon s/p robotic colectomy 01/29/2016   01/07/2016 Procedure    COLONOSCOPY: large polypoid lesion in mid ascending colon -sessile and behind a fold and several polyps per Dr. Havery West      01/12/2016 Imaging    CT C/A/P: Colon wall thickening-ascending and right mesenteric node; small indeterminat liver lesions; #2 pulmonary nodules      01/15/2016 Imaging    MRI Liver: Liver cysts and a hemangioma      01/19/2016 Surgery    Robot assist proximal right colectomy per Arthur West      01/29/2016 Initial Diagnosis    Cancer of ascending colon s/p robotic colectomy 01/29/2016      01/29/2016 Pathologic Stage    pT3, pN0, PMX--Grade 2      01/29/2016 Pathology Results    Adenocarcinoma, IHC normal; MSI stable        HISTORY OF PRESENTING ILLNESS:  Arthur West 55 y.o. male is here because of His recently diagnosed stage II colon cancer. He presents to the clinic with his wife.  He had his first screening colonoscopy on 01/07/2016, which showed a large polypoid lesion in mid ascending colon, and additional multiple polyps. The biopsy of the lesion in the mid ascending colon showed adenocarcinoma. He feels well, denies any symptoms, such as pain, bloating, nausea, change of his bowel habits, hematochezia or melanoma. No recent weight  loss. He was referred to colorectal surgeon Arthur West and underwent robotic-assisted proximal right hemicolectomy. He tolerated surgery very well, was discharged home 2 days after his surgery.  He has recovered well, has mild residual abdominal pain, his bowel movement is normal. His appetite and energy level has recovered well, he tolerates routine activities well without difficulties, no other complaints.  CURRENT THERAPY: observation  INTERIM HISTORY: Mr. Arthur West returns for follow-up. He is doing very well, denies any pain or other symptoms. He has good appetite and energy level, he is back to work full-time. He has normal bowel movement, no melena or hematochezia.  MEDICAL HISTORY:  Past Medical History:  Diagnosis Date  . Allergy   . Arthritis    knees  . Cancer ([employer])    colon  . Headache    sinus headaches  . Sinus congestion    with pollen     SURGICAL HISTORY: Past Surgical History:  Procedure Laterality Date  . EYE SURGERY     as child    SOCIAL HISTORY: Social History   Social History  . Marital status: Married    Spouse name: Arthur West  . Number of children: 3  . Years of education: N/A   Occupational History  . Not on file.   Social History Main Topics  . Smoking status: Never Smoker  . Smokeless tobacco: Never Used  . Alcohol use 1.2 oz/week    2 Standard drinks or equivalent  per week     Comment: 2 beers weekly  . Drug use: No  . Sexual activity: Not on file   Other Topics Concern  . Not on file   Social History Narrative   Arthur West   #3 children ages 76+   Employed as Research scientist (medical) in [place]   He is married, with 3 children 24, 56, 61     FAMILY HISTORY: Family History  Problem Relation Age of Onset  . Arthritis    . Breast cancer    . Stroke    . Hypertension    . Heart attack Father 5  . Heart attack Paternal Grandfather 66  . Cancer Sister 4    breast cancer   . Cancer Maternal Aunt 70    ovarian  cancer   . Cancer Maternal Aunt     colon polyps (4)  . Colon cancer Neg Hx   . Colon polyps Neg Hx   . Esophageal cancer Neg Hx   . Rectal cancer Neg Hx   . Stomach cancer Neg Hx     ALLERGIES:  is allergic to hydrocodone and oxycodone.  MEDICATIONS:  Current Outpatient Prescriptions  Medication Sig Dispense Refill  . aspirin EC 81 MG tablet Take 81 mg by mouth as needed.     . Multiple Vitamin (MULTIVITAMIN WITH MINERALS) TABS tablet Take 1 tablet by mouth daily.     No current facility-administered medications for this visit.     REVIEW OF SYSTEMS:   Constitutional: Denies fevers, chills or abnormal night sweats Eyes: Denies blurriness of vision, double vision or watery eyes Ears, nose, mouth, throat, and face: Denies mucositis or sore throat Respiratory: Denies cough, dyspnea or wheezes Cardiovascular: Denies palpitation, chest discomfort or lower extremity swelling Gastrointestinal:  Denies nausea, heartburn or change in bowel habits, mild residual abdominal pain from the surgery. Skin: Denies abnormal skin rashes Lymphatics: Denies new lymphadenopathy or easy bruising Neurological:Denies numbness, tingling or new weaknesses Behavioral/Psych: Mood is stable, no new changes  All other systems were reviewed with the patient and are negative.  PHYSICAL EXAMINATION: ECOG PERFORMANCE STATUS: 0 - Asymptomatic BP 157/93, pulse 59, respiratory rate 18, temperature 98.6, pulse ox 100% on room air, weight 239 pounds GENERAL:alert, no distress and comfortable SKIN: skin color, texture, turgor are normal, no rashes or significant lesions EYES: normal, conjunctiva are pink and non-injected, sclera clear OROPHARYNX:no exudate, no erythema and lips, buccal mucosa, and tongue normal  NECK: supple, thyroid normal size, non-tender, without nodularity LYMPH:  no palpable lymphadenopathy in the cervical, axillary or inguinal LUNGS: clear to auscultation and percussion with normal  breathing effort HEART: regular rate & rhythm and no murmurs and no lower extremity edema ABDOMEN:abdomen soft, non-tender and normal bowel sounds. The laparoscopic incision sites have healed well. Musculoskeletal:no cyanosis of digits and no clubbing  PSYCH: alert & oriented x 3 with fluent speech NEURO: no focal motor/sensory deficits  LABORATORY DATA:  I have reviewed the data as listed CBC Latest Ref Rng & Units 05/17/2016 01/30/2016 01/22/2016  WBC 4.0 - 10.3 10e3/uL 6.6 7.2 5.4  Hemoglobin 13.0 - 17.1 g/dL 14.8 12.4(L) 15.3  Hematocrit 38.4 - 49.9 % 45.4 38.1(L) 45.3  Platelets 140 - 400 10e3/uL 189 180 183   CMP Latest Ref Rng & Units 01/30/2016 01/11/2016 11/23/2015  Glucose 65 - 99 mg/dL 109(H) 96 138(H)  BUN 6 - 20 mg/dL 14 24(H) 16  Creatinine 0.61 - 1.24 mg/dL 1.14 1.20 1.10  Sodium 135 -  145 mmol/L 139 140 141  Potassium 3.5 - 5.1 mmol/L 3.9 3.8 3.9  Chloride 101 - 111 mmol/L 106 104 104  CO2 22 - 32 mmol/L '29 28 28  ' Calcium 8.9 - 10.3 mg/dL 8.6(L) 9.5 9.5  Total Protein 6.0 - 8.3 g/dL - 7.4 7.2  Total Bilirubin 0.2 - 1.2 mg/dL - 0.8 0.6  Alkaline Phos 39 - 117 U/L - 57 73  AST 0 - 37 U/L - 19 20  ALT 0 - 53 U/L - 22 25   CEA (<2.5NG/ML):  01/11/2016: 0.7   PATHOLOGY REPORT  Diagnosis 01/29/2016 Colon, segmental resection for tumor, proximal right colon - COLONIC ADENOCARCINOMA, 2.6 CM. - TUMOR FOCALLY EXTENDS INTO PERICOLONIC CONNECTIVE TISSUE. - MARGINS NOT INVOLVED. - TWENTY-FOUR BENIGN LYMPH NODES (0/24). Microscopic Comment COLON AND RECTUM (INCLUDING TRANS-ANAL RESECTION): Specimen: Right colon Procedure: Segmental resection Tumor site: Proximal ascending colon Specimen integrity: Intact Macroscopic intactness of mesorectum: N/A Macroscopic tumor perforation: No Invasive tumor: Maximum size: 2.6 cm Histologic type(s): Colorectal adenocarcinoma Histologic grade and differentiation: G2: moderately differentiated/low grade Type of polyp in which invasive  carcinoma arose: No residual polyp Microscopic extension of invasive tumor: Focally into pericolonic connective tissue Lymph-Vascular invasion: Not identified Peri-neural invasion: No Tumor deposit(s) (discontinuous extramural extension): No Resection margins: Proximal margin: Free of tumor Distal margin: Free of tumor Circumferential (radial) (posterior ascending, posterior descending; lateral and posterior mid-rectum; and entire lower 1/3 rectum): Free of tumor Mesenteric margin (sigmoid and transverse): N/A Distance closest margin (if all above margins negative): 9.5 cm from proximal margin Treatment effect (neo-adjuvant therapy): No Additional polyp(s): No Non-neoplastic findings: Unremarkable appendix Lymph nodes: number examined 24; number positive: 0 Pathologic Staging: pT3, pN0, pMX Ancillary studies: Mismatch repair protein by immunohistochemistry and microsatellite instability by PCR. (JDP:kh 02-01-16)  Mismatch Repair (MMR) Protein Immunohistochemistry (IHC) IHC Expression Result: MLH1: Preserved nuclear expression (greater 50% tumor expression) MSH2: Preserved nuclear expression (greater 50% tumor expression) MSH6: Preserved nuclear expression (greater 50% tumor expression) PMS2: Preserved nuclear expression (greater 50% tumor expression) * Internal control demonstrates intact nuclear expression Interpretation: NORMAL  MSI-stable   RADIOGRAPHIC STUDIES: I have personally reviewed the radiological images as listed and agreed with the findings in the report.  CT chest, abdomen and pelvis with contrast 01/12/2016 IMPRESSION: 1. Medial mid ascending colon 2.0 x 0.9 cm focus of wall thickening, which could correlate with the known primary ascending colon malignancy. 2. Solitary mildly enlarged right mesenteric node, cannot exclude nodal metastasis. 3. Low-attenuation 1.0 cm right liver lobe lesion with indeterminate density, liver metastasis not excluded. Two additional  subcentimeter low-attenuation right liver lobe lesions, too small to characterize. Liver protocol MRI abdomen without with IV contrast is recommended for further characterization. These liver lesions are probably too small for characterization by PET-CT. 4. Two pulmonary nodules in the right upper lobe, largest 5 mm, probably benign given subpleural upper lung location. Recommend attention on follow-up chest CT in 3-6 months. 5. Faint patchy centrilobular ground-glass nodularity in both lungs. If the patient is a nonsmoker (as per EPIC), this finding is suggestive of subacute hypersensitivity pneumonitis. Recommend clinical correlation for any history of inhalational exposure. 6. Mildly enlarged prostate.  Liver MRI w wo contrast 01/15/2016 IMPRESSION: 1. No evidence of hepatic metastasis. Multiple hepatic cysts, including the indeterminate right liver lobe lesion on CT. There is also a segment 7 hepatic hemangioma. 2. Equivocal ileocolic mesenteric nodes, as before.  Colonoscopy 01/07/2016 - One 3 mm polyp in the cecum, removed with a cold snare.  Resected and retrieved. - One 6 mm polyp in the ascending colon, removed with a cold snare. Resected and retrieved. - One 8 mm polyp in the ascending colon, removed with a hot snare. Resected and retrieved. - One 8 mm polyp in the transverse colon, removed with a hot snare. Resected and retrieved. - One 3 mm polyp in the transverse colon, removed with a cold snare. Resected and retrieved. - Rule out malignancy, polypoid lesion in the mid ascending colon. Biopsied. Tattooed. - The examination was otherwise normal on direct and retroflexion views.   ASSESSMENT & PLAN:  55 year old African-American male, found to have a right ascending colon mass on his first screening colonoscopy.  1. Cancer of ascending colon, invasive adenocarcinoma, grade 2, pT3N0M0, stage IIA, MSI-stable -I reviewed his CT scan findings, and surgical pathology results in  great details with patient and his wife. -He has early-stage disease, node negative, no high risk features, such as T4 lesion, lymphovascular invasion, obstruction, perforation, etc. His risk of recurrence is low to moderate.  -He is clinically doing well, exam is unremarkable, . Reviewed his lab results, CBC is normal, CMP and CEA are still pending. No clinical concern of recurrence. -We'll continue cancer surveillance, which consists physical exam and lab test (including CBC, CMP and CEA) every 3-4 months for the first 2-3 years, then every 6-12 months for a total of 5 years. He would need repeated colonoscopy in one year, and surveilliance CT scan every 12 month for up to 5 year.  -Continue healthy diet and exercise.  2. Genetics -We discussed some of colon cancer are inheritable, such as Lynch syndrome. -His colon cancer has MSI stable, this is unlikely range syndrome. -He does have family history of breast and ovarian cancer, and colon polyps, I will refer him to genetics -I encouraged his children to start screening colonoscopy at age of 81, 56 years younger than him.  Plan -Lab and follow-up in 3 months -Genetic referral  All questions were answered. The patient knows to call the clinic with any problems, questions or concerns. I spent 15 minutes counseling the patient face to face. The total time spent in the appointment was 20 minutes and more than 50% was on counseling.     Arthur Merle, MD 05/17/2016 9:44 AM

## 2016-05-17 NOTE — Telephone Encounter (Signed)
Gave patient avs report and appointments for November and January.  °

## 2016-05-19 ENCOUNTER — Telehealth: Payer: Self-pay | Admitting: *Deleted

## 2016-05-19 NOTE — Telephone Encounter (Signed)
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   Pt contact [phone]

## 2016-05-19 NOTE — Telephone Encounter (Signed)
LM for patient to return call and schedule an appointment.

## 2016-05-23 NOTE — Telephone Encounter (Signed)
Patient scheduled to come in 05/26/16.

## 2016-05-23 NOTE — Telephone Encounter (Signed)
Noted  

## 2016-05-26 ENCOUNTER — Ambulatory Visit: Payer: BLUE CROSS/BLUE SHIELD | Admitting: Family Medicine

## 2016-06-16 ENCOUNTER — Telehealth: Payer: Self-pay | Admitting: Hematology

## 2016-06-16 ENCOUNTER — Telehealth: Payer: Self-pay | Admitting: Genetic Counselor

## 2016-06-16 NOTE — Telephone Encounter (Signed)
Appointments canceled per patient request.

## 2016-06-16 NOTE — Telephone Encounter (Signed)
Appointment canceled per patient request

## 2016-06-21 ENCOUNTER — Encounter: Payer: BLUE CROSS/BLUE SHIELD | Admitting: Genetic Counselor

## 2016-06-21 ENCOUNTER — Other Ambulatory Visit: Payer: BLUE CROSS/BLUE SHIELD

## 2016-08-16 ENCOUNTER — Other Ambulatory Visit: Payer: BLUE CROSS/BLUE SHIELD

## 2016-08-16 ENCOUNTER — Ambulatory Visit: Payer: BLUE CROSS/BLUE SHIELD | Admitting: Hematology

## 2016-09-23 NOTE — Progress Notes (Signed)
[place]  [phone] [phone]  Clinic Follow up Note   Patient Care Team: Arthur Haven, MD as PCP - General (Family Medicine) Arthur Boston, MD as Consulting Physician (General Surgery) Arthur Gunning, MD as Consulting Physician (Gastroenterology) 09/26/2016   CHIEF COMPLAINTS:  Follow up colon cancer  Oncology History   Noted on screening colonoscopy  Cancer of ascending colon s/p robotic colectomy 01/29/2016   Staging form: Colon and Rectum, AJCC 7th Edition     Pathologic stage from 01/19/2016: Stage IIA (T3, N0, cM0) - Signed by Arthur Merle, MD on 02/16/2016       Cancer of ascending colon s/p robotic colectomy 01/29/2016   01/07/2016 Procedure    COLONOSCOPY: large polypoid lesion in mid ascending colon -sessile and behind a fold and several polyps per Dr. Havery West      01/12/2016 Imaging    CT C/A/P: Colon wall thickening-ascending and right mesenteric node; small indeterminat liver lesions; #2 pulmonary nodules      01/15/2016 Imaging    MRI Liver: Liver cysts and a hemangioma      01/19/2016 Surgery    Robot assist proximal right colectomy per Dr. Johney West      01/29/2016 Initial Diagnosis    Cancer of ascending colon s/p robotic colectomy 01/29/2016      01/29/2016 Pathologic Stage    pT3, pN0, PMX--Grade 2      01/29/2016 Pathology Results    Adenocarcinoma, IHC normal; MSI stable       HISTORY OF PRESENTING ILLNESS:  Arthur West 56 y.o. male is here because of His recently diagnosed stage II colon cancer. He presents to the clinic with his wife.  He had his first screening colonoscopy on 01/07/2016, which showed a large polypoid lesion in mid ascending colon, and additional multiple polyps. The biopsy of the lesion in the mid ascending colon showed adenocarcinoma. He feels well, denies any symptoms, such as pain, bloating, nausea, change of his bowel habits, hematochezia or melanoma. No recent weight loss.  He was referred to colorectal surgeon Dr. Johney West and underwent robotic-assisted proximal right hemicolectomy. He tolerated surgery very well, was discharged home 2 days after his surgery.  He has recovered well, has mild residual abdominal pain, his bowel movement is normal. His appetite and energy level has recovered well, he tolerates routine activities well without difficulties, no other complaints.  CURRENT THERAPY: observation  INTERIM HISTORY: Arthur West returns for follow-up. He has been feeling well. He has been eating normal and his bowels have been fine. He feels as if he has recovered well from surgery. Denies abdominal cramps, or any other concerns.   MEDICAL HISTORY:  Past Medical History:  Diagnosis Date  . Allergy   . Arthritis    knees  . Cancer ([place])    colon  . Headache    sinus headaches  . Sinus congestion    with pollen     SURGICAL HISTORY: Past Surgical History:  Procedure Laterality Date  . EYE SURGERY     as child    SOCIAL HISTORY: Social History   Social History  . Marital status: Married    Spouse name: Arthur West  . Number of children: 3  . Years of education: N/A   Occupational History  . Not on file.   Social History Main Topics  . Smoking status: Never Smoker  . Smokeless tobacco: Never Used  . Alcohol use 1.2 oz/week    2 Standard drinks or equivalent  per week     Comment: 2 beers weekly  . Drug use: No  . Sexual activity: Not on file   Other Topics Concern  . Not on file   Social History Narrative   Arthur West   #3 children ages 17+   Employed as Research scientist (medical) in [place]   He is married, with 3 children 24, 18, 45   FAMILY HISTORY: Family History  Problem Relation Age of Onset  . Arthritis    . Breast cancer    . Stroke    . Hypertension    . Heart attack Father 39  . Heart attack Paternal Grandfather 41  . Cancer Sister 22    breast cancer   . Cancer Maternal Aunt 70    ovarian cancer     . Cancer Maternal Aunt     colon polyps (4)  . Colon cancer Neg Hx   . Colon polyps Neg Hx   . Esophageal cancer Neg Hx   . Rectal cancer Neg Hx   . Stomach cancer Neg Hx     ALLERGIES:  is allergic to hydrocodone and oxycodone.  MEDICATIONS:  Current Outpatient Prescriptions  Medication Sig Dispense Refill  . aspirin EC 81 MG tablet Take 81 mg by mouth as needed.     . Multiple Vitamin (MULTIVITAMIN WITH MINERALS) TABS tablet Take 1 tablet by mouth daily.    . pseudoephedrine-acetaminophen (TYLENOL SINUS) 30-500 MG TABS tablet Take 2 tablets by mouth every 4 (four) hours as needed.     No current facility-administered medications for this visit.     REVIEW OF SYSTEMS:   Constitutional: Denies fevers, chills or abnormal night sweats Eyes: Denies blurriness of vision, double vision or watery eyes Ears, nose, mouth, throat, and face: Denies mucositis or sore throat Respiratory: Denies cough, dyspnea or wheezes Cardiovascular: Denies palpitation, chest discomfort or lower extremity swelling Gastrointestinal:  Denies nausea, heartburn or change in bowel habits, mild residual abdominal pain from the surgery. Skin: Denies abnormal skin rashes Lymphatics: Denies new lymphadenopathy or easy bruising Neurological:Denies numbness, tingling or new weaknesses Behavioral/Psych: Mood is stable, no new changes  All other systems were reviewed with the patient and are negative.  PHYSICAL EXAMINATION: ECOG PERFORMANCE STATUS: 0 - Asymptomatic   BP (!) 128/92 (BP Location: Left Arm, Patient Position: Sitting)   Pulse 73   Temp 98.6 F (37 C) (Oral)   Resp 18   Wt 246 lb 4.8 oz (111.7 kg)   SpO2 99%   BMI 34.35 kg/m   GENERAL:alert, no distress and comfortable SKIN: skin color, texture, turgor are normal, no rashes or significant lesions EYES: normal, conjunctiva are pink and non-injected, sclera clear OROPHARYNX:no exudate, no erythema and lips, buccal mucosa, and tongue normal   NECK: supple, thyroid normal size, non-tender, without nodularity LYMPH:  no palpable lymphadenopathy in the cervical, axillary or inguinal LUNGS: clear to auscultation and percussion with normal breathing effort HEART: regular rate & rhythm and no murmurs and no lower extremity edema ABDOMEN:abdomen soft, non-tender and normal bowel sounds. The laparoscopic incision sites have healed well. Musculoskeletal:no cyanosis of digits and no clubbing  PSYCH: alert & oriented x 3 with fluent speech NEURO: no focal motor/sensory deficits  LABORATORY DATA:  I have reviewed the data as listed CBC Latest Ref Rng & Units 09/26/2016 05/17/2016 01/30/2016  WBC 4.0 - 10.3 10e3/uL 5.9 6.6 7.2  Hemoglobin 13.0 - 17.1 g/dL 15.7 14.8 12.4(L)  Hematocrit 38.4 - 49.9 % 47.0 45.4  38.1(L)  Platelets 140 - 400 10e3/uL 177 189 180   CMP Latest Ref Rng & Units 09/26/2016 05/17/2016 01/30/2016  Glucose 70 - 140 mg/dl 107 100 109(H)  BUN 7.0 - 26.0 mg/dL 17.5 19.2 14  Creatinine 0.7 - 1.3 mg/dL 1.2 1.2 1.14  Sodium 136 - 145 mEq/L 139 142 139  Potassium 3.5 - 5.1 mEq/L 4.0 3.8 3.9  Chloride 101 - 111 mmol/L - - 106  CO2 22 - 29 mEq/L _0 Calcium 8.4 - 10.4 mg/dL 9.8 9.6 8.6(L)  Total Protein 6.4 - 8.3 g/dL 7.6 7.6 -  Total Bilirubin 0.20 - 1.20 mg/dL 0.86 0.44 -  Alkaline Phos 40 - 150 U/L 71 72 -  AST 5 - 34 U/L 23 22 -  ALT 0 - 55 U/L 25 20 -   CEA (<2.5NG/ML):  01/11/2016: 0.7 05/17/2016: 1.39 09/26/2016: PENDING  PATHOLOGY REPORT   Diagnosis 01/29/2016 Colon, segmental resection for tumor, proximal right colon - COLONIC ADENOCARCINOMA, 2.6 CM. - TUMOR FOCALLY EXTENDS INTO PERICOLONIC CONNECTIVE TISSUE. - MARGINS NOT INVOLVED. - TWENTY-FOUR BENIGN LYMPH NODES (0/24). Microscopic Comment COLON AND RECTUM (INCLUDING TRANS-ANAL RESECTION): Specimen: Right colon Procedure: Segmental resection Tumor site: Proximal ascending colon Specimen integrity: Intact Macroscopic intactness of mesorectum:  N/A Macroscopic tumor perforation: No Invasive tumor: Maximum size: 2.6 cm Histologic type(s): Colorectal adenocarcinoma Histologic grade and differentiation: G2: moderately differentiated/low grade Type of polyp in which invasive carcinoma arose: No residual polyp Microscopic extension of invasive tumor: Focally into pericolonic connective tissue Lymph-Vascular invasion: Not identified Peri-neural invasion: No Tumor deposit(s) (discontinuous extramural extension): No Resection margins: Proximal margin: Free of tumor Distal margin: Free of tumor Circumferential (radial) (posterior ascending, posterior descending; lateral and posterior mid-rectum; and entire lower 1/3 rectum): Free of tumor Mesenteric margin (sigmoid and transverse): N/A Distance closest margin (if all above margins negative): 9.5 cm from proximal margin Treatment effect (neo-adjuvant therapy): No Additional polyp(s): No Non-neoplastic findings: Unremarkable appendix Lymph nodes: number examined 24; number positive: 0 Pathologic Staging: pT3, pN0, pMX Ancillary studies: Mismatch repair protein by immunohistochemistry and microsatellite instability by PCR. (JDP:kh 02-01-16)  Mismatch Repair (MMR) Protein Immunohistochemistry (IHC) IHC Expression Result: MLH1: Preserved nuclear expression (greater 50% tumor expression) MSH2: Preserved nuclear expression (greater 50% tumor expression) MSH6: Preserved nuclear expression (greater 50% tumor expression) PMS2: Preserved nuclear expression (greater 50% tumor expression) * Internal control demonstrates intact nuclear expression Interpretation: NORMAL  MSI-stable   RADIOGRAPHIC STUDIES: I have personally reviewed the radiological images as listed and agreed with the findings in the report.  CT chest, abdomen and pelvis with contrast 01/12/2016 IMPRESSION: 1. Medial mid ascending colon 2.0 x 0.9 cm focus of wall thickening, which could correlate with the known primary  ascending colon malignancy. 2. Solitary mildly enlarged right mesenteric node, cannot exclude nodal metastasis. 3. Low-attenuation 1.0 cm right liver lobe lesion with indeterminate density, liver metastasis not excluded. Two additional subcentimeter low-attenuation right liver lobe lesions, too small to characterize. Liver protocol MRI abdomen without with IV contrast is recommended for further characterization. These liver lesions are probably too small for characterization by PET-CT. 4. Two pulmonary nodules in the right upper lobe, largest 5 mm, probably benign given subpleural upper lung location. Recommend attention on follow-up chest CT in 3-6 months. 5. Faint patchy centrilobular ground-glass nodularity in both lungs. If the patient is a nonsmoker (as per EPIC), this finding is suggestive of subacute hypersensitivity pneumonitis. Recommend clinical correlation for any history of inhalational exposure. 6. Mildly enlarged prostate.  Liver MRI w wo contrast 01/15/2016 IMPRESSION: 1. No evidence of hepatic metastasis. Multiple hepatic cysts, including the indeterminate right liver lobe lesion on CT. There is also a segment 7 hepatic hemangioma. 2. Equivocal ileocolic mesenteric nodes, as before.  Colonoscopy 01/07/2016 - One 3 mm polyp in the cecum, removed with a cold snare. Resected and retrieved. - One 6 mm polyp in the ascending colon, removed with a cold snare. Resected and retrieved. - One 8 mm polyp in the ascending colon, removed with a hot snare. Resected and retrieved. - One 8 mm polyp in the transverse colon, removed with a hot snare. Resected and retrieved. - One 3 mm polyp in the transverse colon, removed with a cold snare. Resected and retrieved. - Rule out malignancy, polypoid lesion in the mid ascending colon. Biopsied. Tattooed. - The examination was otherwise normal on direct and retroflexion views.   ASSESSMENT & PLAN:   56 y.o. African-American male, found  to have a right ascending colon mass on his first screening colonoscopy.  1. Cancer of ascending colon, invasive adenocarcinoma, grade 2, pT3N0M0, stage IIA, MSI-stable -I previously reviewed his CT scan findings, and surgical pathology results in great details with patient and his wife. -He has early-stage disease, node negative, no high risk features, such as T4 lesion, lymphovascular invasion, obstruction, perforation, etc. His risk of recurrence is low to moderate.  -He is clinically doing well, exam is unremarkable, . Reviewed his lab results, CBC is normal, CMP and CEA are still pending. No clinical concern of recurrence. -We'll continue cancer surveillance, which consists physical exam and lab test (including CBC, CMP and CEA) every 3-4 months for the first 2-3 years, then every 6-12 months for a total of 5 years. He would need repeated colonoscopy in one year, and surveilliance CT scan every 12 months for up to 5 years.  -Continue healthy diet and exercise. -Order repeat scan before next visit.  -colonoscopy with Dr. Havery West in June 2018.   2. Genetics -We previously discussed some of colon cancer are inheritable, such as Lynch syndrome. -His colon cancer has MSI stable, this is unlikely Lynch syndrome. -He does have family history of breast and ovarian cancer, and colon polyps, I will refer him to genetics -I encouraged his children to start screening colonoscopy at age of 38, 27 years younger than him.  Plan -Ordered repeat scan before next visit.  -Colonoscopy before next visit, I'll send a message to Dr. Havery West -refer him to genetics  -Lab and follow-up in 4 months  All questions were answered. The patient knows to call the clinic with any problems, questions or concerns.  I spent 20 minutes counseling the patient face to face. The total time spent in the appointment was 25 minutes and more than 50% was on counseling.  This document serves as a record of services  personally performed by Arthur Merle, MD. It was created on her behalf by Martinique Casey, a trained medical scribe. The creation of this record is based on the scribe's personal observations and the provider's statements to them. This document has been checked and approved by the attending provider.  I have reviewed the above documentation for accuracy and completeness and I agree with the above.    Arthur Merle, MD 09/26/2016 11:22 AM

## 2016-09-26 ENCOUNTER — Encounter: Payer: Self-pay | Admitting: Hematology

## 2016-09-26 ENCOUNTER — Other Ambulatory Visit (HOSPITAL_BASED_OUTPATIENT_CLINIC_OR_DEPARTMENT_OTHER): Payer: BLUE CROSS/BLUE SHIELD

## 2016-09-26 ENCOUNTER — Ambulatory Visit (HOSPITAL_BASED_OUTPATIENT_CLINIC_OR_DEPARTMENT_OTHER): Payer: BLUE CROSS/BLUE SHIELD | Admitting: Hematology

## 2016-09-26 ENCOUNTER — Telehealth: Payer: Self-pay | Admitting: Hematology

## 2016-09-26 ENCOUNTER — Telehealth: Payer: Self-pay

## 2016-09-26 VITALS — BP 128/92 | HR 73 | Temp 98.6°F | Resp 18 | Wt 246.3 lb

## 2016-09-26 DIAGNOSIS — Z803 Family history of malignant neoplasm of breast: Secondary | ICD-10-CM | POA: Diagnosis not present

## 2016-09-26 DIAGNOSIS — C182 Malignant neoplasm of ascending colon: Secondary | ICD-10-CM

## 2016-09-26 DIAGNOSIS — Z8371 Family history of colonic polyps: Secondary | ICD-10-CM | POA: Diagnosis not present

## 2016-09-26 DIAGNOSIS — Z800 Family history of malignant neoplasm of digestive organs: Secondary | ICD-10-CM

## 2016-09-26 LAB — CBC WITH DIFFERENTIAL/PLATELET
BASO%: 0.2 % (ref 0.0–2.0)
Basophils Absolute: 0 10*3/uL (ref 0.0–0.1)
EOS ABS: 0.2 10*3/uL (ref 0.0–0.5)
EOS%: 2.5 % (ref 0.0–7.0)
HEMATOCRIT: 47 % (ref 38.4–49.9)
HGB: 15.7 g/dL (ref 13.0–17.1)
LYMPH#: 2 10*3/uL (ref 0.9–3.3)
LYMPH%: 33.6 % (ref 14.0–49.0)
MCH: 26 pg — ABNORMAL LOW (ref 27.2–33.4)
MCHC: 33.4 g/dL (ref 32.0–36.0)
MCV: 77.8 fL — AB (ref 79.3–98.0)
MONO#: 0.4 10*3/uL (ref 0.1–0.9)
MONO%: 5.9 % (ref 0.0–14.0)
NEUT%: 57.8 % (ref 39.0–75.0)
NEUTROS ABS: 3.4 10*3/uL (ref 1.5–6.5)
PLATELETS: 177 10*3/uL (ref 140–400)
RBC: 6.04 10*6/uL — ABNORMAL HIGH (ref 4.20–5.82)
RDW: 13.8 % (ref 11.0–14.6)
WBC: 5.9 10*3/uL (ref 4.0–10.3)

## 2016-09-26 LAB — COMPREHENSIVE METABOLIC PANEL
ALT: 25 U/L (ref 0–55)
ANION GAP: 6 meq/L (ref 3–11)
AST: 23 U/L (ref 5–34)
Albumin: 4.1 g/dL (ref 3.5–5.0)
Alkaline Phosphatase: 71 U/L (ref 40–150)
BILIRUBIN TOTAL: 0.86 mg/dL (ref 0.20–1.20)
BUN: 17.5 mg/dL (ref 7.0–26.0)
CALCIUM: 9.8 mg/dL (ref 8.4–10.4)
CO2: 28 mEq/L (ref 22–29)
CREATININE: 1.2 mg/dL (ref 0.7–1.3)
Chloride: 106 mEq/L (ref 98–109)
EGFR: 67 mL/min/{1.73_m2} — ABNORMAL LOW (ref >90)
Glucose: 107 mg/dl (ref 70–140)
Potassium: 4 mEq/L (ref 3.5–5.1)
Sodium: 139 mEq/L (ref 136–145)
TOTAL PROTEIN: 7.6 g/dL (ref 6.4–8.3)

## 2016-09-26 LAB — CEA (IN HOUSE-CHCC): CEA (CHCC-IN HOUSE): 1.21 ng/mL (ref 0.00–5.00)

## 2016-09-26 NOTE — Telephone Encounter (Signed)
Pt is on for June 30th.

## 2016-09-26 NOTE — Telephone Encounter (Signed)
Okay; thanks.

## 2016-09-26 NOTE — Telephone Encounter (Signed)
-----   Message from Manus Gunning, MD sent at 09/26/2016 12:32 PM EST ----- Arthur West can you ensure this patient has a recall in the system and help schedule him for a colonoscopy in June when our schedule comes out? Thanks  ----- Message ----- From: Truitt Merle, MD Sent: 09/26/2016  11:26 AM To: Manus Gunning, MD  He is due for colonoscopy in June 2018, I asked pt to call your office if he does not hear form you for appointment. Thanks  Krista Blue

## 2016-09-26 NOTE — Telephone Encounter (Signed)
Appointments scheduled per 09/26/16 los. Patient was given a copy of the AVS report and appointments schedule, per 09/26/16 los. °

## 2016-11-16 ENCOUNTER — Encounter: Payer: BLUE CROSS/BLUE SHIELD | Admitting: Family Medicine

## 2016-12-27 ENCOUNTER — Encounter: Payer: Self-pay | Admitting: Gastroenterology

## 2017-01-10 ENCOUNTER — Encounter: Payer: Self-pay | Admitting: Gastroenterology

## 2017-01-23 ENCOUNTER — Other Ambulatory Visit: Payer: BLUE CROSS/BLUE SHIELD

## 2017-01-27 ENCOUNTER — Other Ambulatory Visit (HOSPITAL_BASED_OUTPATIENT_CLINIC_OR_DEPARTMENT_OTHER): Payer: BLUE CROSS/BLUE SHIELD

## 2017-01-27 ENCOUNTER — Ambulatory Visit (HOSPITAL_COMMUNITY)
Admission: RE | Admit: 2017-01-27 | Discharge: 2017-01-27 | Disposition: A | Discharge status: Home / Self Care | Payer: BLUE CROSS/BLUE SHIELD | Source: Ambulatory Visit | Attending: Hematology | Admitting: Hematology

## 2017-01-27 DIAGNOSIS — K7689 Other specified diseases of liver: Secondary | ICD-10-CM | POA: Insufficient documentation

## 2017-01-27 DIAGNOSIS — N400 Enlarged prostate without lower urinary tract symptoms: Secondary | ICD-10-CM | POA: Insufficient documentation

## 2017-01-27 DIAGNOSIS — C182 Malignant neoplasm of ascending colon: Secondary | ICD-10-CM | POA: Diagnosis not present

## 2017-01-27 LAB — CBC WITH DIFFERENTIAL/PLATELET
BASO%: 0.4 % (ref 0.0–2.0)
BASOS ABS: 0 10*3/uL (ref 0.0–0.1)
EOS%: 5.1 % (ref 0.0–7.0)
Eosinophils Absolute: 0.3 10*3/uL (ref 0.0–0.5)
HEMATOCRIT: 46.5 % (ref 38.4–49.9)
HGB: 14.9 g/dL (ref 13.0–17.1)
LYMPH#: 2.1 10*3/uL (ref 0.9–3.3)
LYMPH%: 34.2 % (ref 14.0–49.0)
MCH: 25 pg — AB (ref 27.2–33.4)
MCHC: 32.1 g/dL (ref 32.0–36.0)
MCV: 77.9 fL — AB (ref 79.3–98.0)
MONO#: 0.5 10*3/uL (ref 0.1–0.9)
MONO%: 8.1 % (ref 0.0–14.0)
NEUT#: 3.2 10*3/uL (ref 1.5–6.5)
NEUT%: 52.2 % (ref 39.0–75.0)
PLATELETS: 203 10*3/uL (ref 140–400)
RBC: 5.96 10*6/uL — ABNORMAL HIGH (ref 4.20–5.82)
RDW: 14.4 % (ref 11.0–14.6)
WBC: 6.1 10*3/uL (ref 4.0–10.3)

## 2017-01-27 LAB — COMPREHENSIVE METABOLIC PANEL
ALT: 23 U/L (ref 0–55)
AST: 25 U/L (ref 5–34)
Albumin: 3.9 g/dL (ref 3.5–5.0)
Alkaline Phosphatase: 80 U/L (ref 40–150)
Anion Gap: 10 mEq/L (ref 3–11)
BUN: 22.1 mg/dL (ref 7.0–26.0)
CALCIUM: 9.7 mg/dL (ref 8.4–10.4)
CHLORIDE: 107 meq/L (ref 98–109)
CO2: 25 mEq/L (ref 22–29)
CREATININE: 1.2 mg/dL (ref 0.7–1.3)
EGFR: 65 mL/min/{1.73_m2} — AB (ref >90)
Glucose: 102 mg/dl (ref 70–140)
Potassium: 4 mEq/L (ref 3.5–5.1)
Sodium: 142 mEq/L (ref 136–145)
Total Bilirubin: 0.78 mg/dL (ref 0.20–1.20)
Total Protein: 7.5 g/dL (ref 6.4–8.3)

## 2017-01-27 MED ORDER — IOPAMIDOL (ISOVUE-300) INJECTION 61%: 100.0000 mL | Freq: Once | INTRAVENOUS | Status: DC | PRN

## 2017-01-27 MED ORDER — IOPAMIDOL (ISOVUE-300) INJECTION 61%
INTRAVENOUS | Status: AC
  Filled 2017-01-27: qty 100

## 2017-01-29 HISTORY — PX: COLONOSCOPY: SHX174

## 2017-01-30 ENCOUNTER — Inpatient Hospital Stay: Payer: BLUE CROSS/BLUE SHIELD | Attending: Hematology | Admitting: Hematology

## 2017-01-30 ENCOUNTER — Telehealth: Payer: Self-pay | Admitting: Hematology

## 2017-01-30 ENCOUNTER — Encounter: Payer: Self-pay | Admitting: Hematology

## 2017-01-30 VITALS — BP 151/89 | HR 62 | Temp 98.5°F | Resp 18 | Ht 71.0 in | Wt 237.7 lb

## 2017-01-30 DIAGNOSIS — C182 Malignant neoplasm of ascending colon: Secondary | ICD-10-CM

## 2017-01-30 LAB — CEA (IN HOUSE-CHCC): CEA (CHCC-In House): 1.19 ng/mL (ref 0.00–5.00)

## 2017-01-30 NOTE — Progress Notes (Signed)
[place]  [phone] [phone]  Clinic Follow up Note   Patient Care Team: Arthur Haven, MD as PCP - General (Family Medicine) Arthur Boston, MD as Consulting Physician (General Surgery) Armbruster, Renelda Loma, MD as Consulting Physician (Gastroenterology) 01/30/2017   CHIEF COMPLAINTS:  Follow up colon cancer  Oncology History   Noted on screening colonoscopy  Cancer of ascending colon s/p robotic colectomy 01/29/2016   Staging form: Colon and Rectum, AJCC 7th Edition     Pathologic stage from 01/19/2016: Stage IIA (T3, N0, cM0) - Signed by Arthur Merle, MD on 02/16/2016       Cancer of ascending colon s/p robotic colectomy 01/29/2016   01/07/2016 Procedure    COLONOSCOPY: large polypoid lesion in mid ascending colon -sessile and behind a fold and several polyps per Dr. Havery West      01/12/2016 Imaging    CT C/A/P: Colon wall thickening-ascending and right mesenteric node; small indeterminat liver lesions; #2 pulmonary nodules      01/15/2016 Imaging    MRI Liver: Liver cysts and a hemangioma      01/19/2016 Surgery    Robot assist proximal right colectomy per Arthur West      01/29/2016 Initial Diagnosis    Cancer of ascending colon s/p robotic colectomy 01/29/2016      01/29/2016 Pathologic Stage    pT3, pN0, PMX--Grade 2      01/29/2016 Pathology Results    Adenocarcinoma, IHC normal; MSI stable      01/27/2017 Imaging    CT AP W Contrast 01/27/17 IMPRESSION: Interval right colectomy. No evidence of definitive recurrence or metastatic process.       HISTORY OF PRESENTING ILLNESS:  Arthur West 56 y.o. male is here because of His recently diagnosed stage II colon cancer. He presents to the clinic with his wife.  He had his first screening colonoscopy on 01/07/2016, which showed a large polypoid lesion in mid ascending colon, and additional multiple polyps. The biopsy of the lesion in the mid ascending colon showed  adenocarcinoma. He feels well, denies any symptoms, such as pain, bloating, nausea, change of his bowel habits, hematochezia or melanoma. No recent weight loss. He was referred to colorectal surgeon Arthur West and underwent robotic-assisted proximal right hemicolectomy. He tolerated surgery very well, was discharged home 2 days after his surgery.  He has recovered well, has mild residual abdominal pain, his bowel movement is normal. His appetite and energy level has recovered well, he tolerates routine activities well without difficulties, no other complaints.  CURRENT THERAPY: observation  INTERIM HISTORY:  Arthur West returns for follow-up. He presents to the clinic today with family. He reports to be doing well. He has been having regular BM.    MEDICAL HISTORY:  Past Medical History:  Diagnosis Date  . Allergy   . Arthritis    knees  . Cancer ([place])    colon  . Headache    sinus headaches  . Sinus congestion    with pollen     SURGICAL HISTORY: Past Surgical History:  Procedure Laterality Date  . EYE SURGERY     as child    SOCIAL HISTORY: Social History   Social History  . Marital status: Married    Spouse name: Arthur West  . Number of children: 3  . Years of education: N/A   Occupational History  . Not on file.   Social History Main Topics  . Smoking status: Never Smoker  . Smokeless tobacco:  Never Used  . Alcohol use 1.2 oz/week    2 Standard drinks or equivalent per week     Comment: 2 beers weekly  . Drug use: No  . Sexual activity: Not on file   Other Topics Concern  . 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 Arthritis Unknown   . Breast cancer Unknown   . Stroke Unknown   . Hypertension Unknown   . Heart attack Father 16  . Heart attack  Paternal Grandfather 36  . Cancer Sister 78       breast cancer   . Cancer Maternal Aunt 70       ovarian cancer   . Cancer Maternal Aunt        colon polyps (4)  . Colon cancer Neg Hx   . Colon polyps Neg Hx   . Esophageal cancer Neg Hx   . Rectal cancer Neg Hx   . Stomach cancer Neg Hx     ALLERGIES:  is allergic to hydrocodone and oxycodone.  MEDICATIONS:  Current Outpatient Prescriptions  Medication Sig Dispense Refill  . aspirin EC 81 MG tablet Take 81 mg by mouth as needed.     . Multiple Vitamin (MULTIVITAMIN WITH MINERALS) TABS tablet Take 1 tablet by mouth daily.    . pseudoephedrine-acetaminophen (TYLENOL SINUS) 30-500 MG TABS tablet Take 2 tablets by mouth every 4 (four) hours as needed.     No current facility-administered medications for this visit.     REVIEW OF SYSTEMS:   Constitutional: Denies fevers, chills or abnormal night sweats Eyes: Denies blurriness of vision, double vision or watery eyes Ears, nose, mouth, throat, and face: Denies mucositis or sore throat Respiratory: Denies cough, dyspnea or wheezes Cardiovascular: Denies palpitation, chest discomfort or lower extremity swelling Gastrointestinal:  Denies nausea, heartburn or change in bowel habits, mild residual abdominal pain from the surgery. Skin: Denies abnormal skin rashes Lymphatics: Denies new lymphadenopathy or easy bruising Neurological:Denies numbness, tingling or new weaknesses Behavioral/Psych: Mood is stable, no new changes  All other systems were reviewed with the patient and are negative.  PHYSICAL EXAMINATION: ECOG PERFORMANCE STATUS: 0 - Asymptomatic   BP (!) 151/89 (BP Location: Left Arm, Patient Position: Sitting) Comment: RN Arthur West aware of bp  Pulse 62   Temp 98.5 F (36.9 C) (Oral)   Resp 18   Ht _0  (1.803 m)   Wt 237 lb 11.2 oz (107.8 kg)   SpO2 100%   BMI 33.15 kg/m   GENERAL:alert, no distress and comfortable SKIN: skin color, texture, turgor are normal, no  rashes or significant lesions EYES: normal, conjunctiva are pink and non-injected, sclera clear OROPHARYNX:no exudate, no erythema and lips, buccal mucosa, and tongue normal  NECK: supple, thyroid normal size, non-tender, without nodularity LYMPH:  no palpable lymphadenopathy in the cervical, axillary or inguinal LUNGS: clear to auscultation and percussion with normal breathing effort HEART: regular rate & rhythm and no murmurs and no lower extremity edema ABDOMEN:abdomen soft, non-tender and normal bowel sounds. The laparoscopic incision sites have healed well. Musculoskeletal:no cyanosis of digits and no clubbing  PSYCH: alert & oriented x 3 with fluent speech NEURO: no focal motor/sensory deficits  LABORATORY DATA:  I have reviewed the data as  listed CBC Latest Ref Rng & Units 01/27/2017 09/26/2016 05/17/2016  WBC 4.0 - 10.3 10e3/uL 6.1 5.9 6.6  Hemoglobin 13.0 - 17.1 g/dL 14.9 15.7 14.8  Hematocrit 38.4 - 49.9 % 46.5 47.0 45.4  Platelets 140 - 400 10e3/uL 203 177 189   CMP Latest Ref Rng & Units 01/27/2017 09/26/2016 05/17/2016  Glucose 70 - 140 mg/dl 102 107 100  BUN 7.0 - 26.0 mg/dL 22.1 17.5 19.2  Creatinine 0.7 - 1.3 mg/dL 1.2 1.2 1.2  Sodium 136 - 145 mEq/L 142 139 142  Potassium 3.5 - 5.1 mEq/L 4.0 4.0 3.8  Chloride 101 - 111 mmol/L - - -  CO2 22 - 29 mEq/L _0 Calcium 8.4 - 10.4 mg/dL 9.7 9.8 9.6  Total Protein 6.4 - 8.3 g/dL 7.5 7.6 7.6  Total Bilirubin 0.20 - 1.20 mg/dL 0.78 0.86 0.44  Alkaline Phos 40 - 150 U/L 80 71 72  AST 5 - 34 U/L _1 ALT 0 - 55 U/L _2 CEA (<2.5NG/ML):  01/11/2016: 0.7 05/17/2016: 1.39 09/26/2016: 1.21 01/27/17: 1.19  PATHOLOGY REPORT   Diagnosis 01/29/2016 Colon, segmental resection for tumor, proximal right colon - COLONIC ADENOCARCINOMA, 2.6 CM. - TUMOR FOCALLY EXTENDS INTO PERICOLONIC CONNECTIVE TISSUE. - MARGINS NOT INVOLVED. - TWENTY-FOUR BENIGN LYMPH NODES (0/24). Microscopic Comment COLON AND RECTUM (INCLUDING  TRANS-ANAL RESECTION): Specimen: Right colon Procedure: Segmental resection Tumor site: Proximal ascending colon Specimen integrity: Intact Macroscopic intactness of mesorectum: N/A Macroscopic tumor perforation: No Invasive tumor: Maximum size: 2.6 cm Histologic type(s): Colorectal adenocarcinoma Histologic grade and differentiation: G2: moderately differentiated/low grade Type of polyp in which invasive carcinoma arose: No residual polyp Microscopic extension of invasive tumor: Focally into pericolonic connective tissue Lymph-Vascular invasion: Not identified Peri-neural invasion: No Tumor deposit(s) (discontinuous extramural extension): No Resection margins: Proximal margin: Free of tumor Distal margin: Free of tumor Circumferential (radial) (posterior ascending, posterior descending; lateral and posterior mid-rectum; and entire lower 1/3 rectum): Free of tumor Mesenteric margin (sigmoid and transverse): N/A Distance closest margin (if all above margins negative): 9.5 cm from proximal margin Treatment effect (neo-adjuvant therapy): No Additional polyp(s): No Non-neoplastic findings: Unremarkable appendix Lymph nodes: number examined 24; number positive: 0 Pathologic Staging: pT3, pN0, pMX Ancillary studies: Mismatch repair protein by immunohistochemistry and microsatellite instability by PCR. (JDP:kh 02-01-16)  Mismatch Repair (MMR) Protein Immunohistochemistry (IHC) IHC Expression Result: MLH1: Preserved nuclear expression (greater 50% tumor expression) MSH2: Preserved nuclear expression (greater 50% tumor expression) MSH6: Preserved nuclear expression (greater 50% tumor expression) PMS2: Preserved nuclear expression (greater 50% tumor expression) * Internal control demonstrates intact nuclear expression Interpretation: NORMAL  MSI-stable   RADIOGRAPHIC STUDIES: I have personally reviewed the radiological images as listed and agreed with the findings in the report.  CT  AP W Contrast 01/27/17 IMPRESSION: Interval right colectomy. No evidence of definitive recurrence or metastatic process.  CT chest, abdomen and pelvis with contrast 01/12/2016 IMPRESSION: 1. Medial mid ascending colon 2.0 x 0.9 cm focus of wall thickening, which could correlate with the known primary ascending colon malignancy. 2. Solitary mildly enlarged right mesenteric node, cannot exclude nodal metastasis. 3. Low-attenuation 1.0 cm right liver lobe lesion with indeterminate density, liver metastasis not excluded. Two additional subcentimeter low-attenuation right liver lobe lesions, too small to characterize. Liver protocol MRI abdomen without with IV contrast is recommended for further characterization. These liver lesions are probably too small for characterization by PET-CT. 4. Two pulmonary nodules in the right upper lobe, largest 5  mm, probably benign given subpleural upper lung location. Recommend attention on follow-up chest CT in 3-6 months. 5. Faint patchy centrilobular ground-glass nodularity in both lungs. If the patient is a nonsmoker (as per EPIC), this finding is suggestive of subacute hypersensitivity pneumonitis. Recommend clinical correlation for any history of inhalational exposure. 6. Mildly enlarged prostate.  Liver MRI w wo contrast 01/15/2016 IMPRESSION: 1. No evidence of hepatic metastasis. Multiple hepatic cysts, including the indeterminate right liver lobe lesion on CT. There is also a segment 7 hepatic hemangioma. 2. Equivocal ileocolic mesenteric nodes, as before.  Colonoscopy 01/07/2016 - One 3 mm polyp in the cecum, removed with a cold snare. Resected and retrieved. - One 6 mm polyp in the ascending colon, removed with a cold snare. Resected and retrieved. - One 8 mm polyp in the ascending colon, removed with a hot snare. Resected and retrieved. - One 8 mm polyp in the transverse colon, removed with a hot snare. Resected and retrieved. - One 3 mm  polyp in the transverse colon, removed with a cold snare. Resected and retrieved. - Rule out malignancy, polypoid lesion in the mid ascending colon. Biopsied. Tattooed. - The examination was otherwise normal on direct and retroflexion views.   ASSESSMENT & PLAN:   56 y.o. African-American male, found to have a right ascending colon mass on his first screening colonoscopy.  1. Cancer of ascending colon, invasive adenocarcinoma, grade 2, pT3N0M0, stage IIA, MSI-stable -I previously reviewed his CT scan findings, and surgical pathology results in great details with patient and his wife. -He has early-stage disease, node negative, no high risk features, such as T4 lesion, lymphovascular invasion, obstruction, perforation, etc. His risk of recurrence is low to moderate.  -He is clinically doing well, exam is unremarkable, . Reviewed his lab results, CBC is normal, CMP and CEA are still pending. No clinical concern of recurrence. -We'll continue cancer surveillance, which consists physical exam and lab test (including CBC, CMP and CEA) every 3-4 months for the first 2-3 years, then every 6-12 months for a total of 5 years. He would need repeated colonoscopy in one year, and surveilliance CT scan every 12 months for up to 5 years.  -Continue healthy diet and exercise. -We discussed his 01/27/17 CT scan and there is no evidence of cancer.  -He has his colonoscopy later in July by Dr. Havery West -Labs reviewed and liver-kidney function and CE is normal. His blood counts are overall normal.  -will continue with surveillance with f/u, exam and labs every 4 months and will repeat scan   2. Genetics -We previously discussed some of colon cancer are inheritable, such as Lynch syndrome. -His colon cancer has MSI stable, this is unlikely Lynch syndrome. -He does have family history of breast and ovarian cancer, and colon polyps, I will refer him to genetics -I previously encouraged his children to start  screening colonoscopy at age of 33, 78 years younger than him.  Plan -Lab and f/u in 4 months  -Colonoscopy later in July 2018, scheduled    All questions were answered. The patient knows to call the clinic with any problems, questions or concerns.  I spent 20 minutes counseling the patient face to face. The total time spent in the appointment was 25 minutes and more than 50% was on counseling.  This document serves as a record of services personally performed by Arthur Merle, MD. It was created on her behalf by Joslyn Devon, a trained medical scribe. The creation of this record is  based on the scribe's personal observations and the provider's statements to them. This document has been checked and approved by the attending provider.   I have reviewed the above documentation for accuracy and completeness and I agree with the above.    Arthur Merle, MD 01/30/2017 7:47 PM

## 2017-01-30 NOTE — Telephone Encounter (Signed)
Scheduled appt per 7/2 los - Gave patient AVS and calender per los. Lab and f/u in 4 months.

## 2017-02-13 ENCOUNTER — Ambulatory Visit (AMBULATORY_SURGERY_CENTER): Payer: Self-pay

## 2017-02-13 VITALS — Ht 72.0 in | Wt 234.8 lb

## 2017-02-13 DIAGNOSIS — Z85038 Personal history of other malignant neoplasm of large intestine: Secondary | ICD-10-CM

## 2017-02-13 MED ORDER — NA SULFATE-K SULFATE-MG SULF 17.5-3.13-1.6 GM/177ML PO SOLN: 1.0000 | Freq: Once | ORAL | 0 refills | Status: AC

## 2017-02-13 NOTE — Progress Notes (Signed)
Denies allergies to eggs or soy products. Denies complication of anesthesia or sedation. Denies use of weight loss medication. Denies use of O2.   Emmi instructions given for colonoscopy.  

## 2017-02-27 ENCOUNTER — Ambulatory Visit (AMBULATORY_SURGERY_CENTER): Payer: BLUE CROSS/BLUE SHIELD | Admitting: Gastroenterology

## 2017-02-27 ENCOUNTER — Encounter: Payer: Self-pay | Admitting: Gastroenterology

## 2017-02-27 VITALS — BP 111/73 | HR 47 | Temp 98.0°F | Resp 15 | Ht 72.0 in | Wt 234.0 lb

## 2017-02-27 DIAGNOSIS — Z85038 Personal history of other malignant neoplasm of large intestine: Secondary | ICD-10-CM

## 2017-02-27 DIAGNOSIS — Z86010 Personal history of colonic polyps: Secondary | ICD-10-CM

## 2017-02-27 DIAGNOSIS — D122 Benign neoplasm of ascending colon: Secondary | ICD-10-CM | POA: Diagnosis not present

## 2017-02-27 MED ORDER — SODIUM CHLORIDE 0.9 % IV SOLN: 500.0000 mL | INTRAVENOUS | Status: AC

## 2017-02-27 NOTE — Patient Instructions (Signed)
YOU HAD AN ENDOSCOPIC PROCEDURE TODAY AT THE [facility]:   Refer to the procedure report that was given to you for any specific questions about what was found during the examination.  If the procedure report does not answer your questions, please call your gastroenterologist to clarify.  If you requested that your care partner not be given the details of your procedure findings, then the procedure report has been included in a sealed envelope for you to review at your convenience later.  YOU SHOULD EXPECT: Some feelings of bloating in the abdomen. Passage of more gas than usual.  Walking can help get rid of the air that was put into your GI tract during the procedure and reduce the bloating. If you had a lower endoscopy (such as a colonoscopy or flexible sigmoidoscopy) you may notice spotting of blood in your stool or on the toilet paper. If you underwent a bowel prep for your procedure, you may not have a normal bowel movement for a few days.  Please Note:  You might notice some irritation and congestion in your nose or some drainage.  This is from the oxygen used during your procedure.  There is no need for concern and it should clear up in a day or so.  SYMPTOMS TO REPORT IMMEDIATELY:   Following lower endoscopy (colonoscopy or flexible sigmoidoscopy):  Excessive amounts of blood in the stool  Significant tenderness or worsening of abdominal pains  Swelling of the abdomen that is new, acute  Fever of 100F or higher  For urgent or emergent issues, a gastroenterologist can be reached at any hour by calling [phone].   DIET:  We do recommend a small meal at first, but then you may proceed to your regular diet.  Drink plenty of fluids but you should avoid alcoholic beverages for 24 hours.  ACTIVITY:  You should plan to take it easy for the rest of today and you should NOT DRIVE or use heavy machinery until tomorrow (because of the sedation medicines used during the test).     FOLLOW UP: Our staff will call the number listed on your records the next business day following your procedure to check on you and address any questions or concerns that you may have regarding the information given to you following your procedure. If we do not reach you, we will leave a message.  However, if you are feeling well and you are not experiencing any problems, there is no need to return our call.  We will assume that you have returned to your regular daily activities without incident.  If any biopsies were taken you will be contacted by phone or by letter within the next 1-3 weeks.  Please call us at [phone] if you have not heard about the biopsies in 3 weeks.   Await for biopsy results to determine next repeat Colonoscopy screening Polyps (handout given) ..No ibuprofen, naproxen or other non-steroidal anti-inflammatory drugs for 2 weeks after polyp removal. Tylenol okay if needed.    SIGNATURES/CONFIDENTIALITY: You and/or your care partner have signed paperwork which will be entered into your electronic medical record.  These signatures attest to the fact that that the information above on your After Visit Summary has been reviewed and is understood.  Full responsibility of the confidentiality of this discharge information lies with you and/or your care-partner. 

## 2017-02-27 NOTE — Progress Notes (Signed)
A/ox3 pleased with MAC, report to [facility]

## 2017-02-27 NOTE — Progress Notes (Signed)
Called to room to assist during endoscopic procedure.  Patient ID and intended procedure confirmed with present staff. Received instructions for my participation in the procedure from the performing physician.  

## 2017-02-27 NOTE — Op Note (Signed)
[facility] [place] Patient Name: Arthur West Procedure Date: 02/27/2017 3:05 PM MRN: [id number] Endoscopist: Remo Lipps P. Armbruster MD, MD Age: 56 Referring MD:  Date of Birth: 1960-09-02 Gender: Male Account #: [id number] Procedure:                Colonoscopy Indications:              High risk colon cancer surveillance: Personal                            history of colon cancer s/p resection one year ago Medicines:                Monitored Anesthesia Care Procedure:                Pre-Anesthesia Assessment:                           - Prior to the procedure, a History and Physical                            was performed, and patient medications and                            allergies were reviewed. The patient's tolerance of                            previous anesthesia was also reviewed. The risks                            and benefits of the procedure and the sedation                            options and risks were discussed with the patient.                            All questions were answered, and informed consent                            was obtained. Prior Anticoagulants: The patient has                            taken no previous anticoagulant or antiplatelet                            agents. ASA Grade Assessment: II - A patient with                            mild systemic disease. After reviewing the risks                            and benefits, the patient was deemed in                            satisfactory condition to undergo the procedure.  After obtaining informed consent, the colonoscope                            was passed under direct vision. Throughout the                            procedure, the patient's blood pressure, pulse, and                            oxygen saturations were monitored continuously. The                            Colonoscope was introduced through the anus and                            advanced  to the the ileocolonic anastomosis. The                            colonoscopy was performed without difficulty. The                            patient tolerated the procedure well. The quality                            of the bowel preparation was good. The rectum and                            surgical anastomosis was photographed. Scope In: 3:13:37 PM Scope Out: 3:27:17 PM Scope Withdrawal Time: 0 hours 12 minutes 11 seconds  Total Procedure Duration: 0 hours 13 minutes 40 seconds  Findings:                 The perianal and digital rectal examinations were                            normal.                           There was evidence of a prior end-to-end                            ileo-colonic anastomosis in the ascending colon.                            This was patent and was characterized by healthy                            appearing mucosa.                           A diminutive polyp was found in the anastomosis.                            The polyp was sessile, I suspect reactive /  inflammatory polyp. The polyp was removed with a                            cold biopsy forceps. Resection and retrieval were                            complete.                           The exam was otherwise without abnormality on                            direct and retroflexion views. Complications:            No immediate complications. Estimated blood loss:                            Minimal. Estimated Blood Loss:     Estimated blood loss was minimal. Impression:               - Patent end-to-end ileo-colonic anastomosis,                            characterized by healthy appearing mucosa.                           - One diminutive polyp at the anastomosis, removed                            with a cold biopsy forceps. Resected and retrieved.                           - The examination was otherwise normal on direct                            and  retroflexion views. Recommendation:           - Patient has a contact number available for                            emergencies. The signs and symptoms of potential                            delayed complications were discussed with the                            patient. Return to normal activities tomorrow.                            Written discharge instructions were provided to the                            patient.                           - Resume previous diet.                           -  Continue present medications.                           - Await pathology results.                           - Repeat colonoscopy in 3 years for screening                            purposes. Remo Lipps P. Armbruster MD, MD 02/27/2017 3:32:09 PM This report has been signed electronically.

## 2017-02-27 NOTE — Progress Notes (Signed)
Pt's states no medical or surgical changes since previsit or office visit.  No egg or soy allergy  

## 2017-02-28 ENCOUNTER — Telehealth: Payer: Self-pay | Admitting: *Deleted

## 2017-02-28 NOTE — Telephone Encounter (Signed)
  Follow up Call-  Call back number 02/27/2017 01/07/2016  Post procedure Call Back phone  # [phone]  Permission to leave phone message Yes Yes  Some recent data might be hidden    [facility]

## 2017-02-28 NOTE — Telephone Encounter (Signed)
No answer left message and will attempt to call back later this afternoon for call back. Sm

## 2017-03-03 ENCOUNTER — Encounter: Payer: Self-pay | Admitting: Gastroenterology

## 2017-04-13 IMAGING — MR MR ABDOMEN WO/W CM
9 of 18 series · 21 of 48 positions shown · IV contrast (multihance)
Comparison: 01/12/2016 CT

CLINICAL DATA: Newly diagnosed mid ascending colonic
adenocarcinoma. Liver lesion.

EXAM:
MRI ABDOMEN WITHOUT AND WITH CONTRAST
TECHNIQUE: Multiplanar multisequence MR imaging of the abdomen was performed
both before and after the administration of intravenous contrast.
CONTRAST:  20mL MULTIHANCE GADOBENATE DIMEGLUMINE 529 MG/ML IV SOLN

[Series 3: T2 fat-sat · axial · 5.0mm · 0.78mm/px · 1 of 56 slices shown]
[im 1/56]
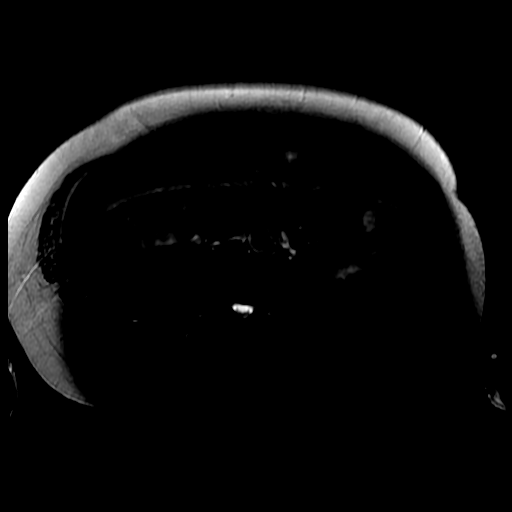

[Series 4: DWI b500 · axial · 6.0mm · 1.72mm/px · z∈[-16,+288]mm · 3 of 80 slices shown]
[im 1/80]
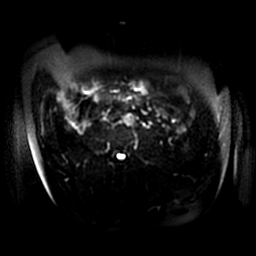
[im 40/80]
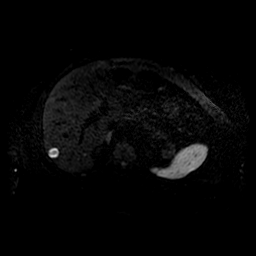
[im 80/80]
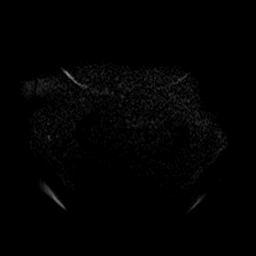

[Series 5: ax dualecho · axial · 5.0mm · 0.78mm/px · z∈[-0,+275]mm · 4 of 112 slices shown]
[im 1/112]
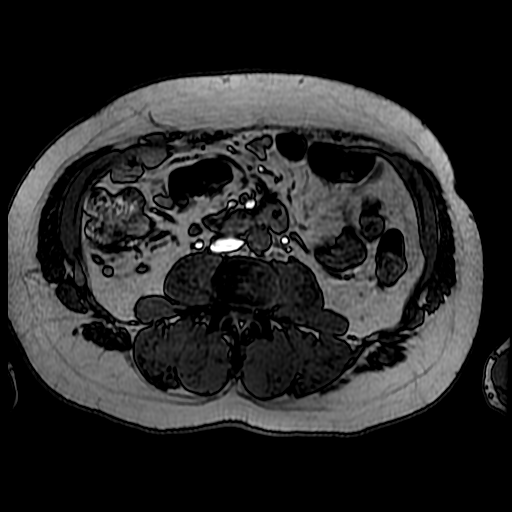
[im 38/112]
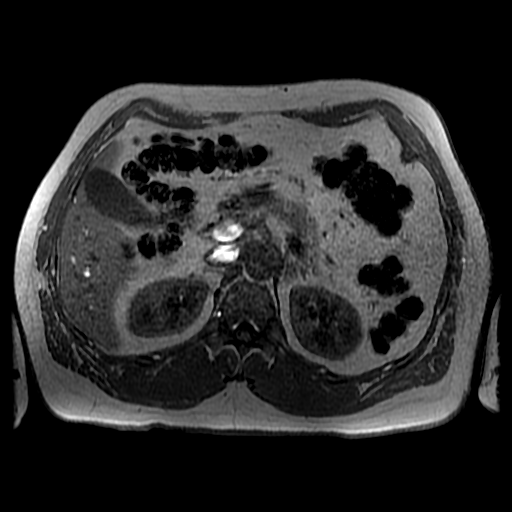
[im 75/112]
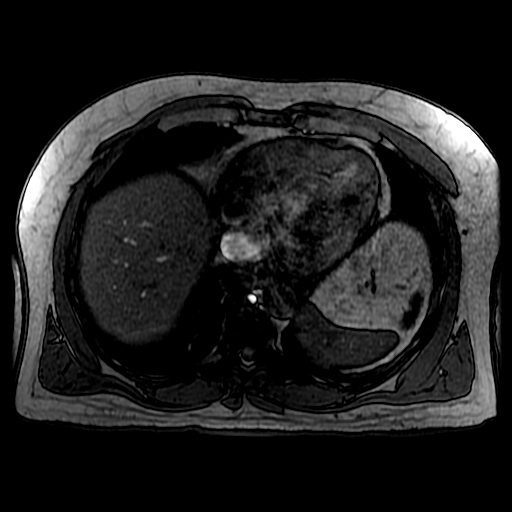
[im 112/112]
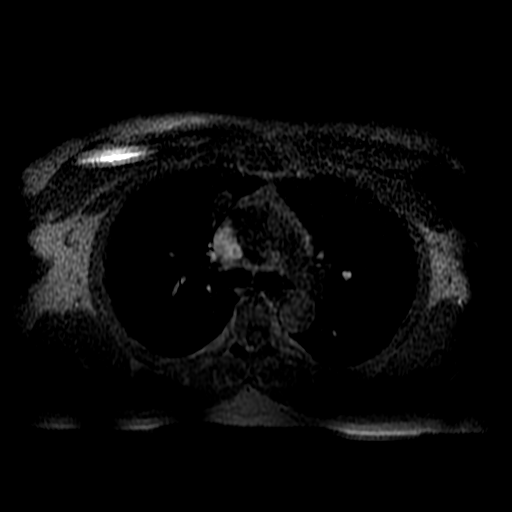

[Series 6: T2 · coronal · 5.0mm · 0.78mm/px · 2 of 48 slices shown (1 of 2)]
[im 1/48]
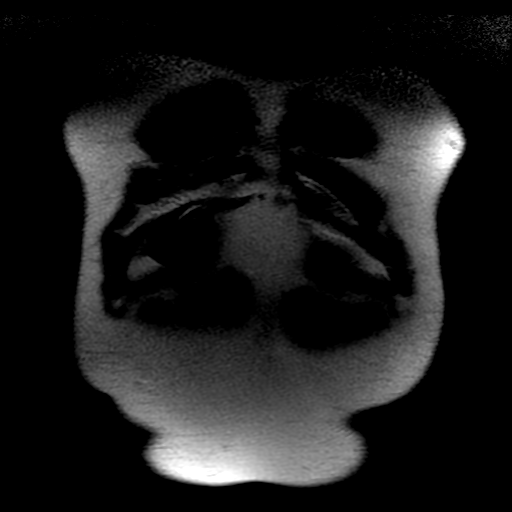
[im 48/48]
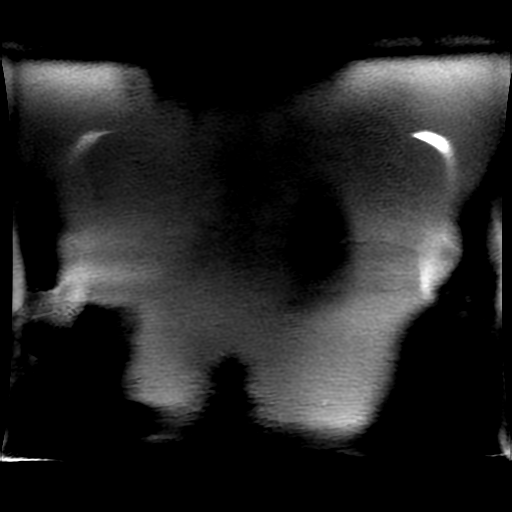

[Series 7: T2 · axial · 5.0mm · 0.78mm/px · z∈[-0,+275]mm · 2 of 56 slices shown (2 of 2)]
[im 1/56]
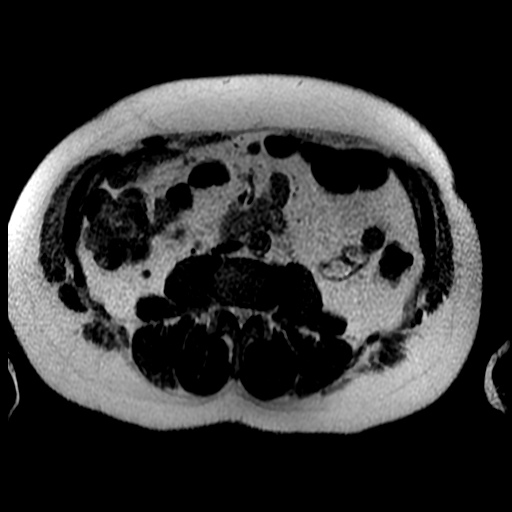
[im 56/56]
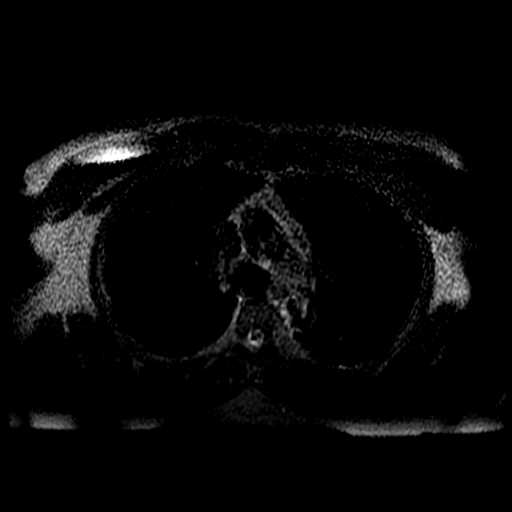

[Series 8: bSSFP · axial · 5.0mm · 0.78mm/px · z∈[-0,+275]mm · 2 of 56 slices shown]
[im 1/56]
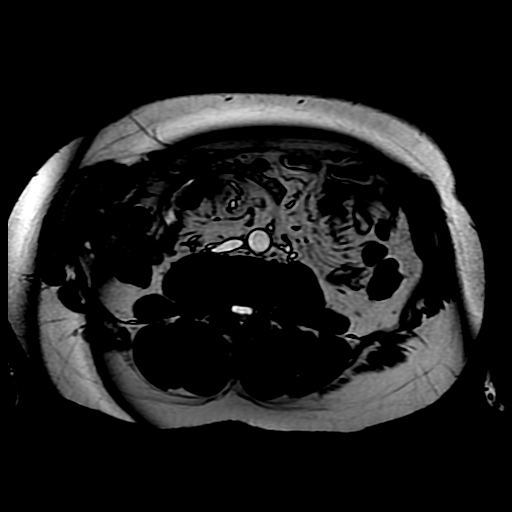
[im 56/56]
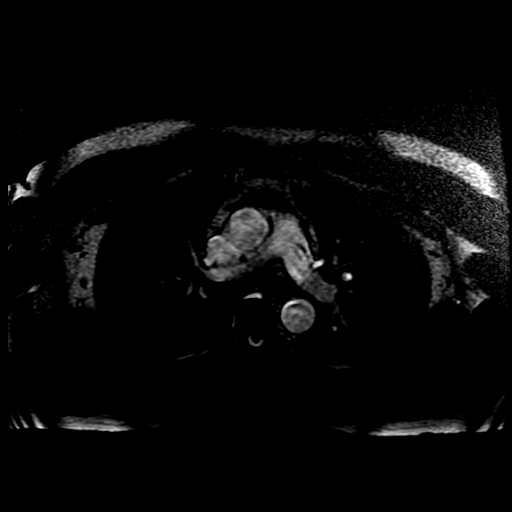

[Series 400: DWI · axial · 6.0mm · 1.72mm/px · 1 of 39 slices shown]
[im 1/39]
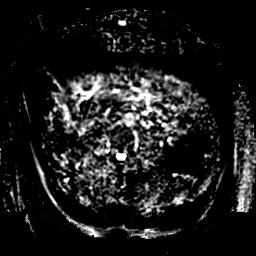

[Series 900: T1 dynamic · axial · 5.8mm · 0.78mm/px · z∈[-12,+240]mm · 3 of 88 slices shown (1 of 2)]
[im 1/88]
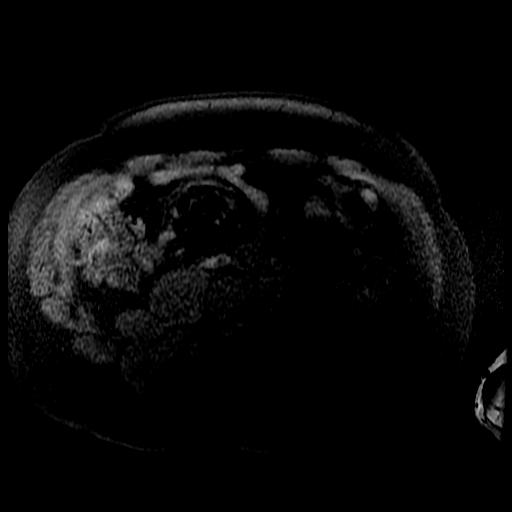
[im 44/88]
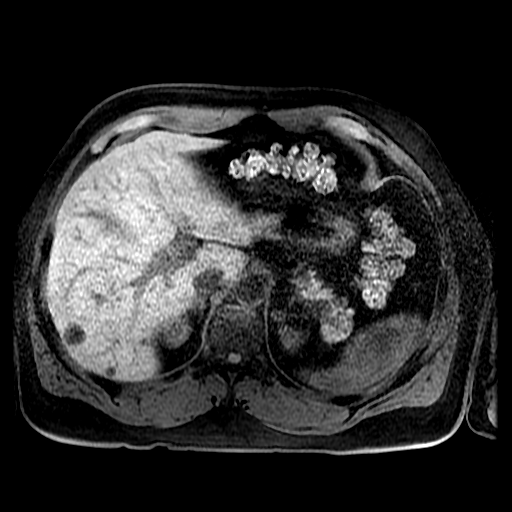
[im 88/88]
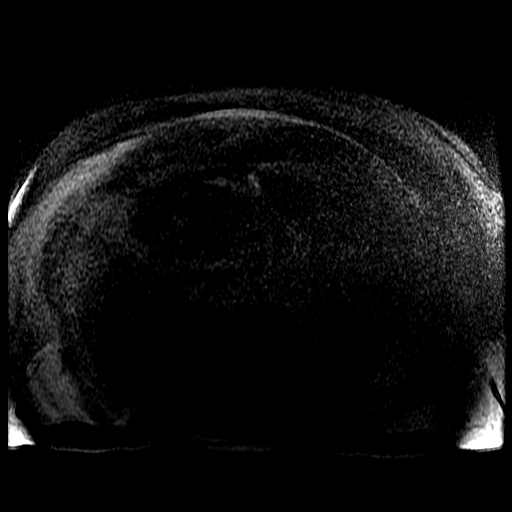

[Series 901: T1 dynamic · axial · 5.8mm · 0.78mm/px · z∈[-12,+240]mm · 3 of 88 slices shown (2 of 2)]
[im 1/88]
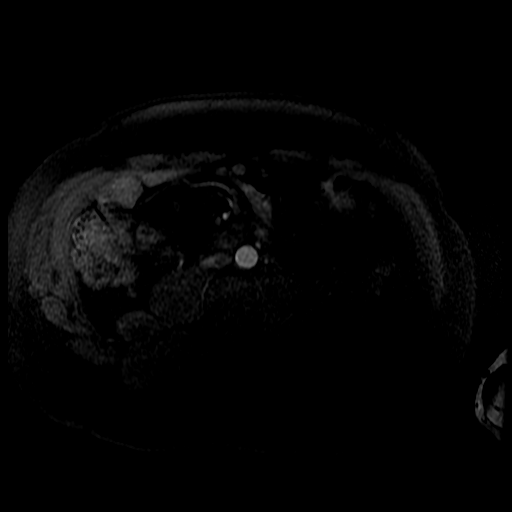
[im 44/88]
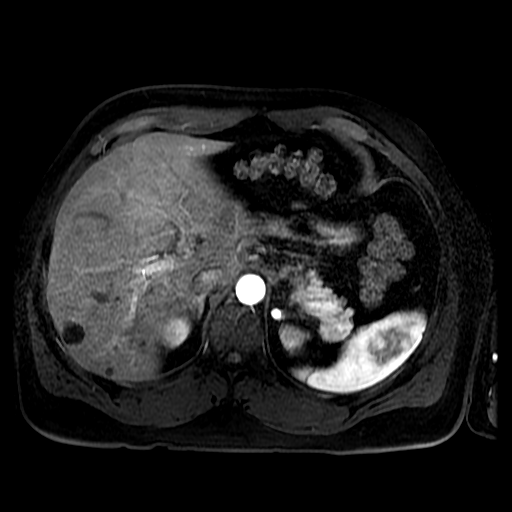
[im 88/88]
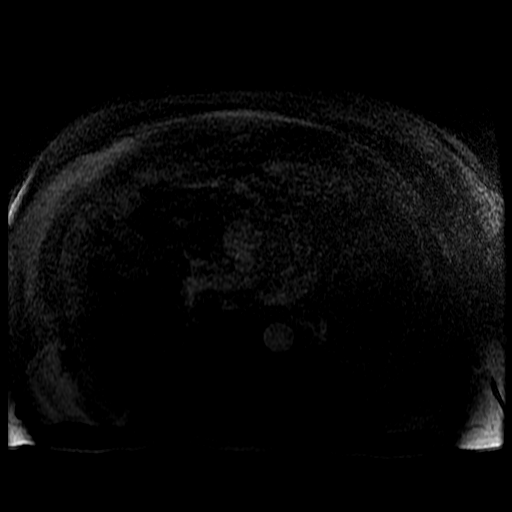

[21 of 48 positions shown; findings below may reference images not displayed]

FINDINGS: Lower chest: Normal heart size without pericardial or pleural
effusion.

Hepatobiliary: Multiple T2 hyperintense nonenhancing liver lesions,
consistent with cysts. The indeterminate CT lesion is consistent
with a small cyst, including at 8 mm on image 28/series 3.

There is also a 9 mm segment 7 lesion which is moderately T2
hyperintense on image 26/series 3. Demonstrates postcontrast
enhancement, including on delayed image 37/ series 904. This is
consistent with a hemangioma.

No suspicious liver lesion.

Normal gallbladder, without biliary ductal dilatation.

Pancreas: Normal, without mass or ductal dilatation.

Spleen: Normal in size, without focal abnormality.

Adrenals/Urinary Tract: Normal adrenal glands. Normal kidneys,
without hydronephrosis.

Stomach/Bowel: Normal stomach and abdominal small bowel. The
ascending colonic primary is again likely identified on image 71/
series 903.

Vascular/Lymphatic: Normal caliber of the aorta and branch vessels.
No retroperitoneal or retrocrural adenopathy. An upper normal
ileocolic mesenteric node is again identified, including at 8 mm on
image 81/series 903.

Other: No ascites.  No evidence of omental or peritoneal disease.

Musculoskeletal: No acute osseous abnormality.
IMPRESSION: 1. No evidence of hepatic metastasis. Multiple hepatic cysts,
including the indeterminate right liver lobe lesion on CT. There is
also a segment 7 hepatic hemangioma.
2. Equivocal ileocolic mesenteric nodes, as before.

## 2017-05-29 ENCOUNTER — Telehealth: Payer: Self-pay | Admitting: Hematology

## 2017-05-29 NOTE — Telephone Encounter (Signed)
Left message for patient regarding appt that was cancelled per 10/29 los. Left a message for patient to call back and reschedule.

## 2017-05-30 ENCOUNTER — Encounter: Payer: Self-pay | Admitting: Nurse Practitioner

## 2017-05-30 ENCOUNTER — Inpatient Hospital Stay: Payer: BLUE CROSS/BLUE SHIELD | Attending: Nurse Practitioner | Admitting: Nurse Practitioner

## 2017-05-30 ENCOUNTER — Other Ambulatory Visit (HOSPITAL_BASED_OUTPATIENT_CLINIC_OR_DEPARTMENT_OTHER): Payer: BLUE CROSS/BLUE SHIELD

## 2017-05-30 ENCOUNTER — Telehealth: Payer: Self-pay

## 2017-05-30 ENCOUNTER — Telehealth: Payer: Self-pay | Admitting: *Deleted

## 2017-05-30 VITALS — BP 138/68 | HR 58 | Temp 98.6°F | Resp 18 | Ht 72.0 in | Wt 220.8 lb

## 2017-05-30 DIAGNOSIS — C182 Malignant neoplasm of ascending colon: Secondary | ICD-10-CM

## 2017-05-30 LAB — COMPREHENSIVE METABOLIC PANEL
ALT: 18 U/L (ref 0–55)
AST: 18 U/L (ref 5–34)
Albumin: 3.9 g/dL (ref 3.5–5.0)
Alkaline Phosphatase: 68 U/L (ref 40–150)
Anion Gap: 8 mEq/L (ref 3–11)
BUN: 18.6 mg/dL (ref 7.0–26.0)
CALCIUM: 9.3 mg/dL (ref 8.4–10.4)
CO2: 27 meq/L (ref 22–29)
Chloride: 105 mEq/L (ref 98–109)
Creatinine: 1.1 mg/dL (ref 0.7–1.3)
EGFR: >60 mL/min/{1.73_m2} (ref >60)
GLUCOSE: 108 mg/dL (ref 70–140)
POTASSIUM: 3.8 meq/L (ref 3.5–5.1)
Sodium: 140 mEq/L (ref 136–145)
Total Bilirubin: 1.1 mg/dL (ref 0.20–1.20)
Total Protein: 7.5 g/dL (ref 6.4–8.3)

## 2017-05-30 LAB — CBC WITH DIFFERENTIAL/PLATELET
BASO%: 0.4 % (ref 0.0–2.0)
BASOS ABS: 0 10*3/uL (ref 0.0–0.1)
EOS ABS: 0.3 10*3/uL (ref 0.0–0.5)
EOS%: 5.1 % (ref 0.0–7.0)
HEMATOCRIT: 47.8 % (ref 38.4–49.9)
HGB: 15.3 g/dL (ref 13.0–17.1)
LYMPH#: 1.7 10*3/uL (ref 0.9–3.3)
LYMPH%: 31.1 % (ref 14.0–49.0)
MCH: 25.1 pg — AB (ref 27.2–33.4)
MCHC: 32 g/dL (ref 32.0–36.0)
MCV: 78.5 fL — AB (ref 79.3–98.0)
MONO#: 0.3 10*3/uL (ref 0.1–0.9)
MONO%: 5.3 % (ref 0.0–14.0)
NEUT#: 3.2 10*3/uL (ref 1.5–6.5)
NEUT%: 58.1 % (ref 39.0–75.0)
PLATELETS: 207 10*3/uL (ref 140–400)
RBC: 6.09 10*6/uL — ABNORMAL HIGH (ref 4.20–5.82)
RDW: 14.6 % (ref 11.0–14.6)
WBC: 5.5 10*3/uL (ref 4.0–10.3)

## 2017-05-30 LAB — CEA (IN HOUSE-CHCC): CEA (CHCC-In House): 1.57 ng/mL (ref 0.00–5.00)

## 2017-05-30 NOTE — Telephone Encounter (Signed)
Spoke with pt's wife, informed her of normal lab. She voiced appreciation for call.

## 2017-05-30 NOTE — Telephone Encounter (Signed)
Printed avs and calender for upcoming appointment for Febuary. Per 10/30 los

## 2017-05-30 NOTE — Progress Notes (Signed)
[facility]  [phone] [phone]  Clinic Follow up Note   Patient Care Team: Leone Haven, MD as PCP - General (Family Medicine) Michael Boston, MD as Consulting Physician (General Surgery) Armbruster, Carlota Raspberry, MD as Consulting Physician (Gastroenterology) 05/30/2017  CC: follow up colon cancer, surveillance   SUMMARY OF ONCOLOGIC HISTORY: Oncology History   Noted on screening colonoscopy  Cancer of ascending colon s/p robotic colectomy 01/29/2016   Staging form: Colon and Rectum, AJCC 7th Edition     Pathologic stage from 01/19/2016: Stage IIA (T3, N0, cM0) - Signed by Truitt Merle, MD on 02/16/2016       Cancer of ascending colon s/p robotic colectomy 01/29/2016   01/07/2016 Procedure    COLONOSCOPY: large polypoid lesion in mid ascending colon -sessile and behind a fold and several polyps per Dr. Havery Moros      01/12/2016 Imaging    CT C/A/P: Colon wall thickening-ascending and right mesenteric node; small indeterminat liver lesions; #2 pulmonary nodules      01/15/2016 Imaging    MRI Liver: Liver cysts and a hemangioma      01/19/2016 Surgery    Robot assist proximal right colectomy per Dr. Johney Maine      01/29/2016 Initial Diagnosis    Cancer of ascending colon s/p robotic colectomy 01/29/2016      01/29/2016 Pathologic Stage    pT3, pN0, PMX--Grade 2      01/29/2016 Pathology Results    Adenocarcinoma, IHC normal; MSI stable      01/27/2017 Imaging    CT AP W Contrast 01/27/17 IMPRESSION: Interval right colectomy. No evidence of definitive recurrence or metastatic process.      02/27/2017 Procedure    COLONOSCOPY per Dr. Havery Moros - The perianal and digital rectal examinations were normal. - There was evidence of a prior end-to-end ileo-colonic anastomosis in the ascending colon. This was patent and was characterized by healthy appearing mucosa. - A diminutive polyp was found in the anastomosis. The polyp was sessile, I  suspect reactive / inflammatory polyp. The polyp was removed with a cold biopsy forceps. Resection and retrieval were complete. - The exam was otherwise without abnormality on direct and retroflexion views.      02/27/2017 Pathology Results    COLONOSCOPY per Dr. Havery Moros Surgical [P], ascending, polyp - TUBULAR ADENOMA. - NO HIGH GRADE DYSPLASIA OR MALIGNANCY.     CURRENT THERAPY: Observation  INTERVAL HISTORY: [facility] returns today for scheduled follow-up for colon cancer surveillance.he feels well today, he denies any changes in his overall health from last visit. He has intentionally lost some weight, trying to eat better and exercise more with BIOflex machine. He denies fatigue, appetite is normal. BMs are regular and daily, denies blood, pain, nausea, or vomiting.   REVIEW OF SYSTEMS:   Constitutional: Denies fatigue, fevers, chills or abnormal weight loss (+) good appetite (+) intentional weight loss, eating healthy and exercising more Eyes: Denies blurriness of vision Ears, nose, mouth, throat, and face: Denies mucositis or sore throat Respiratory: Denies cough, dyspnea or wheezes Cardiovascular: Denies palpitation, chest discomfort or lower extremity swelling Gastrointestinal:  Denies nausea, vomiting, constipation, diarrhea, heartburn or change in bowel habits. Denies rectal pain or bleeding Skin: Denies abnormal skin rashes Lymphatics: Denies new lymphadenopathy or easy bruising Neurological:Denies numbness, tingling or new weaknesses Behavioral/Psych: Mood is stable, no new changes  All other systems were reviewed with the patient and are negative.  MEDICAL HISTORY:  Past Medical History:  Diagnosis Date  .  Allergy   . Arthritis    knees  . Cancer ([place])    colon  . Headache    sinus headaches  . Sinus congestion    with pollen     SURGICAL HISTORY: Past Surgical History:  Procedure Laterality Date  . EYE SURGERY     as child  . LAPAROSCOPIC RIGHT  COLECTOMY      I have reviewed the social history and family history with the patient and they are unchanged from previous note.  ALLERGIES:  is allergic to hydrocodone and oxycodone.  MEDICATIONS:  Current Outpatient Prescriptions  Medication Sig Dispense Refill  . Multiple Vitamin (MULTIVITAMIN WITH MINERALS) TABS tablet Take 1 tablet by mouth daily.     Current Facility-Administered Medications  Medication Dose Route Frequency Provider Last Rate Last Dose  . 0.9 %  sodium chloride infusion  500 mL Intravenous Continuous Armbruster, Carlota Raspberry, MD        PHYSICAL EXAMINATION: ECOG PERFORMANCE STATUS: 0 - Asymptomatic  Vitals:   05/30/17 0859  BP: 138/68  Pulse: (!) 58  Resp: 18  Temp: 98.6 F (37 C)  SpO2: 98%   Filed Weights   05/30/17 0859  Weight: 220 lb 12.8 oz (100.2 kg)    GENERAL:alert, no distress and comfortable SKIN: skin color, texture, turgor are normal, no rashes or significant lesions EYES: normal, Conjunctiva are pink and non-injected, sclera clear OROPHARYNX:no exudate, no erythema and lips, buccal mucosa, and tongue normal  NECK: supple, thyroid normal size, non-tender, without nodularity LYMPH:  no palpable cervical, supraclavicular, axillary, or inguinal lymphadenopathy LUNGS: clear to auscultation bilaterally with normal breathing effort HEART: regular rate & rhythm and no murmurs and no lower extremity edema ABDOMEN:abdomen soft, non-tender and normal bowel sounds. No palpable hepatomegaly or masses. Abdominal surgical incision is well-healed Musculoskeletal:no cyanosis of digits and no clubbing  NEURO: alert & oriented x 3 with fluent speech, no focal motor/sensory deficits  LABORATORY DATA:  I have reviewed the data as listed CBC Latest Ref Rng & Units 05/30/2017 01/27/2017 09/26/2016  WBC 4.0 - 10.3 10e3/uL 5.5 6.1 5.9  Hemoglobin 13.0 - 17.1 g/dL 15.3 14.9 15.7  Hematocrit 38.4 - 49.9 % 47.8 46.5 47.0  Platelets 140 - 400 10e3/uL 207 203 177       CMP Latest Ref Rng & Units 05/30/2017 01/27/2017 09/26/2016  Glucose 70 - 140 mg/dl 108 102 107  BUN 7.0 - 26.0 mg/dL 18.6 22.1 17.5  Creatinine 0.7 - 1.3 mg/dL 1.1 1.2 1.2  Sodium 136 - 145 mEq/L 140 142 139  Potassium 3.5 - 5.1 mEq/L 3.8 4.0 4.0  Chloride 101 - 111 mmol/L - - -  CO2 22 - 29 mEq/L _0 Calcium 8.4 - 10.4 mg/dL 9.3 9.7 9.8  Total Protein 6.4 - 8.3 g/dL 7.5 7.5 7.6  Total Bilirubin 0.20 - 1.20 mg/dL 1.10 0.78 0.86  Alkaline Phos 40 - 150 U/L 68 80 71  AST 5 - 34 U/L _1 ALT 0 - 55 U/L _2 CEA (<2.5NG/ML):  01/11/2016: 0.7 05/17/2016: 1.39 09/26/2016: 1.21 01/27/17: 1.19 05/30/17: 1.57  PATHOLOGY REPORT   Diagnosis 01/29/2016 Colon, segmental resection for tumor, proximal right colon - COLONIC ADENOCARCINOMA, 2.6 CM. - TUMOR FOCALLY EXTENDS INTO PERICOLONIC CONNECTIVE TISSUE. - MARGINS NOT INVOLVED. - TWENTY-FOUR BENIGN LYMPH NODES (0/24). Microscopic Comment COLON AND RECTUM (INCLUDING TRANS-ANAL RESECTION): Specimen: Right colon Procedure: Segmental resection Tumor site: Proximal ascending colon Specimen integrity: Intact Macroscopic intactness of  mesorectum: N/A Macroscopic tumor perforation: No Invasive tumor: Maximum size: 2.6 cm Histologic type(s): Colorectal adenocarcinoma Histologic grade and differentiation: G2: moderately differentiated/low grade Type of polyp in which invasive carcinoma arose: No residual polyp Microscopic extension of invasive tumor: Focally into pericolonic connective tissue Lymph-Vascular invasion: Not identified Peri-neural invasion: No Tumor deposit(s) (discontinuous extramural extension): No Resection margins: Proximal margin: Free of tumor Distal margin: Free of tumor Circumferential (radial) (posterior ascending, posterior descending; lateral and posterior mid-rectum; and entire lower 1/3 rectum): Free of tumor Mesenteric margin (sigmoid and transverse): N/A Distance closest margin (if all  above margins negative): 9.5 cm from proximal margin Treatment effect (neo-adjuvant therapy): No Additional polyp(s): No Non-neoplastic findings: Unremarkable appendix Lymph nodes: number examined 24; number positive: 0 Pathologic Staging: pT3, pN0, pMX Ancillary studies: Mismatch repair protein by immunohistochemistry and microsatellite instability by PCR. (JDP:kh 02-01-16)  Mismatch Repair (MMR) Protein Immunohistochemistry (IHC) IHC Expression Result: MLH1: Preserved nuclear expression (greater 50% tumor expression) MSH2: Preserved nuclear expression (greater 50% tumor expression) MSH6: Preserved nuclear expression (greater 50% tumor expression) PMS2: Preserved nuclear expression (greater 50% tumor expression) * Internal control demonstrates intact nuclear expression Interpretation: NORMAL  MSI-stable   RADIOGRAPHIC STUDIES: I have personally reviewed the radiological images as listed and agreed with the findings in the report.  Colonoscopy per Dr. Havery Moros 02/27/17 Findings: - The perianal and digital rectal examinations were normal. - There was evidence of a prior end-to-end ileo-colonic anastomosis in the ascending colon. This was patent and was characterized by healthy appearing mucosa. - A diminutive polyp was found in the anastomosis. The polyp was sessile, I suspect reactive / inflammatory polyp. The polyp was removed with a cold biopsy forceps. Resection and retrieval were complete. - The exam was otherwise without abnormality on direct and retroflexion views. [facility] [P], ascending, polyp - TUBULAR ADENOMA. - NO HIGH GRADE DYSPLASIA OR MALIGNANCY.  CT AP W Contrast 01/27/17 IMPRESSION: Interval right colectomy. No evidence of definitive recurrence or metastatic process.  CT chest, abdomen and pelvis with contrast 01/12/2016 IMPRESSION: 1. Medial mid ascending colon 2.0 x 0.9 cm focus of wall thickening, which could correlate with  the known primary ascending colon malignancy. 2. Solitary mildly enlarged right mesenteric node, cannot exclude nodal metastasis. 3. Low-attenuation 1.0 cm right liver lobe lesion with indeterminate density, liver metastasis not excluded. Two additional subcentimeter low-attenuation right liver lobe lesions, too small to characterize. Liver protocol MRI abdomen without with IV contrast is recommended for further characterization. These liver lesions are probably too small for characterization by PET-CT. 4. Two pulmonary nodules in the right upper lobe, largest 5 mm, probably benign given subpleural upper lung location. Recommend attention on follow-up chest CT in 3-6 months. 5. Faint patchy centrilobular ground-glass nodularity in both lungs. If the patient is a nonsmoker (as per EPIC), this finding is suggestive of subacute hypersensitivity pneumonitis. Recommend clinical correlation for any history of inhalational exposure. 6. Mildly enlarged prostate.  Liver MRI w wo contrast 01/15/2016 IMPRESSION: 1. No evidence of hepatic metastasis. Multiple hepatic cysts, including the indeterminate right liver lobe lesion on CT. There is also a segment 7 hepatic hemangioma. 2. Equivocal ileocolic mesenteric nodes, as before.  Colonoscopy 01/07/2016 - One 3 mm polyp in the cecum, removed with a cold snare. Resected and retrieved. - One 6 mm polyp in the ascending colon, removed with a cold snare. Resected and retrieved. - One 8 mm polyp in the ascending colon, removed with a hot snare. Resected and retrieved. - One  8 mm polyp in the transverse colon, removed with a hot snare. Resected and retrieved. - One 3 mm polyp in the transverse colon, removed with a cold snare. Resected and retrieved. - Rule out malignancy, polypoid lesion in the mid ascending colon. Biopsied. Tattooed. - The examination was otherwise normal on direct and retroflexion views.   ASSESSMENT & PLAN:   56 y.o.  African-American male, found to have a right ascending colon mass on his first screening colonoscopy.  1. Cancer of ascending colon, invasive adenocarcinoma, grade 2, pT3N0M0, stage IIA, MSI-stable 2. Genetics  [facility] is doing very well. He had colonoscopy per Dr. Havery Moros in 01/2017, a polyp was removed from the ascending colon that was negative for malignancy. He does not have f/u scheduled with his surgeon. He will continue close f/u with PCP, next visit early 2019, and in our clinic. He will have surveillance CT around 12/2017. He declined a flu vaccine today. He has intentionally lost weight, is eating and exercising better. VS, CBC, Cmet, and CEA are stable. Physical exam is normal, no clinical concern for recurrence. He will return for lab and f/u with APP in 4 months.   PLAN: Continue surveillance, lab and f/u in 4 months for first 2-3 years     All questions were answered. The patient knows to call the clinic with any problems, questions or concerns. No barriers to learning was detected.     Alla Feeling, NP 05/30/17

## 2017-05-30 NOTE — Telephone Encounter (Signed)
-----   Message from Truitt Merle, MD sent at 05/30/2017  2:36 PM EDT ----- My nurse, please call pt and let him know that his CEA from today was normal. Thanks  Truitt Merle  05/30/2017

## 2017-06-02 ENCOUNTER — Ambulatory Visit: Payer: BLUE CROSS/BLUE SHIELD | Admitting: Hematology

## 2017-06-02 ENCOUNTER — Other Ambulatory Visit: Payer: BLUE CROSS/BLUE SHIELD

## 2017-07-07 ENCOUNTER — Ambulatory Visit: Payer: BLUE CROSS/BLUE SHIELD | Admitting: Family

## 2017-09-26 ENCOUNTER — Other Ambulatory Visit: Payer: BLUE CROSS/BLUE SHIELD

## 2017-09-26 ENCOUNTER — Ambulatory Visit: Payer: BLUE CROSS/BLUE SHIELD | Admitting: Nurse Practitioner

## 2018-02-02 ENCOUNTER — Telehealth: Payer: Self-pay | Admitting: Hematology

## 2018-02-02 NOTE — Telephone Encounter (Signed)
Patient's wife called in to reschedule his missed appointment from Feb 2019 per 10/30 los

## 2018-02-02 NOTE — Progress Notes (Signed)
[facility]  [phone] [phone]  Clinic Follow up Note   Patient Care Team: Sonnenberg, Eric G, MD as PCP - General (Family Medicine) Gross, Steven, MD as Consulting Physician (General Surgery) Armbruster, Steven P, MD as Consulting Physician (Gastroenterology) 02/05/2018   CHIEF COMPLAINTS:  Follow up colon cancer  Oncology History   Noted on screening colonoscopy  Cancer of ascending colon s/p robotic colectomy 01/29/2016   Staging form: Colon and Rectum, AJCC 7th Edition     Pathologic stage from 01/19/2016: Stage IIA (T3, N0, cM0) - Signed by Yan Feng, MD on 02/16/2016       Cancer of ascending colon s/p robotic colectomy 01/29/2016   01/07/2016 Procedure    COLONOSCOPY: large polypoid lesion in mid ascending colon -sessile and behind a fold and several polyps per Dr. Armbruster      01/12/2016 Imaging    CT C/A/P: Colon wall thickening-ascending and right mesenteric node; small indeterminat liver lesions; #2 pulmonary nodules      01/15/2016 Imaging    MRI Liver: Liver cysts and a hemangioma      01/19/2016 Surgery    Robot assist proximal right colectomy per Dr. Gross      01/29/2016 Initial Diagnosis    Cancer of ascending colon s/p robotic colectomy 01/29/2016      01/29/2016 Pathologic Stage    pT3, pN0, PMX--Grade 2      01/29/2016 Pathology Results    Adenocarcinoma, IHC normal; MSI stable      01/27/2017 Imaging    CT AP W Contrast 01/27/17 IMPRESSION: Interval right colectomy. No evidence of definitive recurrence or metastatic process.      02/27/2017 Procedure    COLONOSCOPY per Dr. Armbruster - The perianal and digital rectal examinations were normal. - There was evidence of a prior end-to-end ileo-colonic anastomosis in the ascending colon. This was patent and was characterized by healthy appearing mucosa. - A diminutive polyp was found in the anastomosis. The polyp was sessile, I suspect reactive /  inflammatory polyp. The polyp was removed with a cold biopsy forceps. Resection and retrieval were complete. - The exam was otherwise without abnormality on direct and retroflexion views.      02/27/2017 Pathology Results    COLONOSCOPY per Dr. Armbruster Surgical [P], ascending, polyp - TUBULAR ADENOMA. - NO HIGH GRADE DYSPLASIA OR MALIGNANCY.      02/27/2017 Pathology Results    02/27/2017 colonoscopy and pathology Diagnosis Surgical [P], ascending, polyp - TUBULAR ADENOMA. - NO HIGH GRADE DYSPLASIA OR MALIGNANCY       HISTORY OF PRESENTING ILLNESS:  Arthur West 57 y.o. male is here because of His recently diagnosed stage II colon cancer. He presents to the clinic with his wife.  He had his first screening colonoscopy on 01/07/2016, which showed a large polypoid lesion in mid ascending colon, and additional multiple polyps. The biopsy of the lesion in the mid ascending colon showed adenocarcinoma. He feels well, denies any symptoms, such as pain, bloating, nausea, change of his bowel habits, hematochezia or melanoma. No recent weight loss. He was referred to colorectal surgeon Dr. Gross and underwent robotic-assisted proximal right hemicolectomy. He tolerated surgery very well, was discharged home 2 days after his surgery.  He has recovered well, has mild residual abdominal pain, his bowel movement is normal. His appetite and energy level has recovered well, he tolerates routine activities well without difficulties, no other complaints.  CURRENT THERAPY: observation  INTERIM HISTORY:  Arthur West is   a 57 y.o. male who returns for follow-up. He presents to the clinic today with his wife.Marland Kitchen He complains of frequent diarrhea 1-2 a day. He states that it is not related to his diet. No blood or pain. He did try to lose weight, but has stopped now. No nausea or bloating. Good appetite. He also complains of back pain that has been these for years. 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 His back hurst once a week, it is intermittent but never too painful to interfere with daily activities. He states that it is not always related to activity.  His hand joints hurt sometimes too. No knee pain. No CP or HA. He complains of frequent sinuses.  He is compliant with medications.     MEDICAL HISTORY:  Past Medical History:  Diagnosis Date  . Allergy   . Arthritis    knees  . Cancer ([place])    colon  . Headache    sinus headaches  . Sinus congestion    with pollen     SURGICAL HISTORY: Past Surgical History:  Procedure Laterality Date  . EYE SURGERY     as child  . LAPAROSCOPIC RIGHT COLECTOMY      SOCIAL HISTORY: Social History   Socioeconomic History  . Marital status: Married    Spouse name: Jackelyn Poling  . Number of children: 3  . Years of education: Not on file  . Highest education level: Not on file  Occupational History  . Not on file  Social Needs  . Financial resource strain: Not on file  . Food insecurity:    Worry: Not on file    Inability: Not on file  . Transportation needs:    Medical: Not on file    Non-medical: Not on file  Tobacco Use  . Smoking status: Never Smoker  . Smokeless tobacco: Never Used  Substance and Sexual Activity  . Alcohol use: Yes    Alcohol/week: 1.2 oz    Types: 2 Standard drinks or equivalent per week    Comment: 2 beers weekly  . Drug use: No  . Sexual activity: Not on file  Lifestyle  . Physical activity:    Days per week: Not on file    Minutes per session: Not on file  . Stress: Not on file  Relationships  . Social connections:    Talks on phone: Not on file    Gets together: Not on file    Attends religious service: Not on file    Active member of club or organization: Not on file    Attends meetings of clubs or organizations: Not on file    Relationship status: Not on file  . Intimate partner violence:    Fear of current or ex partner: Not on file     Emotionally abused: Not on file    Physically abused: Not on file    Forced sexual activity: Not on file  Other Topics Concern  . Not on file  Social History Narrative   Arthur West   #3 children ages 66+   Employed as Research scientist (medical) in [place]   He is married, with 3 children 24, 44, 55   FAMILY HISTORY: Family History  Problem Relation Age of Onset  . Arthritis Unknown   . Breast cancer Unknown   . Stroke Unknown   . Hypertension Unknown   . Heart attack Father 38  . Heart attack Paternal  Grandfather 65  . Cancer Sister 50       breast cancer   . Cancer Maternal Aunt 70       ovarian cancer   . Cancer Maternal Aunt        colon polyps (4)  . Colon cancer Neg Hx   . Colon polyps Neg Hx   . Esophageal cancer Neg Hx   . Rectal cancer Neg Hx   . Stomach cancer Neg Hx     ALLERGIES:  is allergic to hydrocodone and oxycodone.  MEDICATIONS:  Current Outpatient Medications  Medication Sig Dispense Refill  . Multiple Vitamin (MULTIVITAMIN WITH MINERALS) TABS tablet Take 1 tablet by mouth daily.     Current Facility-Administered Medications  Medication Dose Route Frequency Provider Last Rate Last Dose  . 0.9 %  sodium chloride infusion  500 mL Intravenous Continuous Armbruster, Steven P, MD        REVIEW OF SYSTEMS:   Constitutional: Denies fevers, chills or abnormal night sweats Eyes: Denies blurriness of vision, double vision or watery eyes Ears, nose, mouth, throat, and face: Denies mucositis or sore throat (+) sinusitis recently Respiratory: Denies cough, dyspnea or wheezes Cardiovascular: Denies palpitation, chest discomfort or lower extremity swelling Gastrointestinal:  Denies nausea, heartburn no abdominal pain (+) diarrhea Skin: Denies abnormal skin rashes Lymphatics: Denies new lymphadenopathy or easy bruising Neurological:Denies numbness, tingling or new weaknesses MSK: (+) intermittent mild back pain Behavioral/Psych: Mood is  stable, no new changes  All other systems were reviewed with the patient and are negative.  PHYSICAL EXAMINATION: ECOG PERFORMANCE STATUS: 0 - Asymptomatic   BP (!) 139/96 (BP Location: Right Arm, Patient Position: Sitting) Comment: nurse aware of bp  Pulse 60   Temp 99 F (37.2 C) (Oral)   Resp 17   Ht 6' (1.829 m)   Wt 226 lb 9.6 oz (102.8 kg)   SpO2 100%   BMI 30.73 kg/m   GENERAL:alert, no distress and comfortable SKIN: skin color, texture, turgor are normal, no rashes or significant lesions EYES: normal, conjunctiva are pink and non-injected, sclera clear OROPHARYNX:no exudate, no erythema and lips, buccal mucosa, and tongue normal  NECK: supple, thyroid normal size, non-tender, without nodularity LYMPH:  no palpable lymphadenopathy in the cervical, axillary or inguinal LUNGS: clear to auscultation and percussion with normal breathing effort HEART: regular rate & rhythm and no murmurs and no lower extremity edema ABDOMEN:abdomen soft, non-tender and normal bowel sounds. The laparoscopic incision sites have healed well. No HSM Musculoskeletal:no cyanosis of digits and no clubbing  PSYCH: alert & oriented x 3 with fluent speech NEURO: no focal motor/sensory deficits  LABORATORY DATA:  I have reviewed the data as listed CBC Latest Ref Rng & Units 02/05/2018 05/30/2017 01/27/2017  WBC 4.0 - 10.3 K/uL 5.3 5.5 6.1  Hemoglobin 13.0 - 17.1 g/dL 15.6 15.3 14.9  Hematocrit 38.4 - 49.9 % 48.3 47.8 46.5  Platelets 140 - 400 K/uL 178 207 203   CMP Latest Ref Rng & Units 02/05/2018 05/30/2017 01/27/2017  Glucose 70 - 99 mg/dL 105(H) 108 102  BUN 6 - 20 mg/dL 14 18.6 22.1  Creatinine 0.61 - 1.24 mg/dL 1.18 1.1 1.2  Sodium 135 - 145 mmol/L 141 140 142  Potassium 3.5 - 5.1 mmol/L 4.4 3.8 4.0  Chloride 98 - 111 mmol/L 105 - -  CO2 22 - 32 mmol/L 30 27 25  Calcium 8.9 - 10.3 mg/dL 9.6 9.3 9.7  Total Protein 6.5 - 8.1 g/dL 7.3 7.5 7.5    Total Bilirubin 0.3 - 1.2 mg/dL 0.5 1.10 0.78    Alkaline Phos 38 - 126 U/L 63 68 80  AST 15 - 41 U/L _0 ALT 0 - 44 U/L _1 Tumor marker: CEA (<2.5NG/ML) 01/11/2016: 0.7 05/17/2016: 1.39 09/26/2016: 1.21 01/27/17: 1.19 05/30/17: 1.57  Procedures: 02/27/2017 Colonoscopy Diagnosis Surgical [P], ascending, polyp - TUBULAR ADENOMA. - NO HIGH GRADE DYSPLASIA OR MALIGNANCY.   PATHOLOGY REPORT 02/27/2017 Surgical Pathology Diagnosis Surgical [P], ascending, polyp - TUBULAR ADENOMA. - NO HIGH GRADE DYSPLASIA OR MALIGNANCY.   Diagnosis 01/29/2016 Colon, segmental resection for tumor, proximal right colon - COLONIC ADENOCARCINOMA, 2.6 CM. - TUMOR FOCALLY EXTENDS INTO PERICOLONIC CONNECTIVE TISSUE. - MARGINS NOT INVOLVED. - TWENTY-FOUR BENIGN LYMPH NODES (0/24). Microscopic Comment COLON AND RECTUM (INCLUDING TRANS-ANAL RESECTION): Specimen: Right colon Procedure: Segmental resection Tumor site: Proximal ascending colon Specimen integrity: Intact Macroscopic intactness of mesorectum: N/A Macroscopic tumor perforation: No Invasive tumor: Maximum size: 2.6 cm Histologic type(s): Colorectal adenocarcinoma Histologic grade and differentiation: G2: moderately differentiated/low grade Type of polyp in which invasive carcinoma arose: No residual polyp Microscopic extension of invasive tumor: Focally into pericolonic connective tissue Lymph-Vascular invasion: Not identified Peri-neural invasion: No Tumor deposit(s) (discontinuous extramural extension): No Resection margins: Proximal margin: Free of tumor Distal margin: Free of tumor Circumferential (radial) (posterior ascending, posterior descending; lateral and posterior mid-rectum; and entire lower 1/3 rectum): Free of tumor Mesenteric margin (sigmoid and transverse): N/A Distance closest margin (if all above margins negative): 9.5 cm from proximal margin Treatment effect (neo-adjuvant therapy): No Additional polyp(s): No Non-neoplastic findings: Unremarkable  appendix Lymph nodes: number examined 24; number positive: 0 Pathologic Staging: pT3, pN0, pMX Ancillary studies: Mismatch repair protein by immunohistochemistry and microsatellite instability by PCR. (JDP:kh 02-01-16)  Mismatch Repair (MMR) Protein Immunohistochemistry (IHC) IHC Expression Result: MLH1: Preserved nuclear expression (greater 50% tumor expression) MSH2: Preserved nuclear expression (greater 50% tumor expression) MSH6: Preserved nuclear expression (greater 50% tumor expression) PMS2: Preserved nuclear expression (greater 50% tumor expression) * Internal control demonstrates intact nuclear expression Interpretation: NORMAL  MSI-stable   RADIOGRAPHIC STUDIES: I have personally reviewed the radiological images as listed and agreed with the findings in the report.  CT AP W Contrast 01/27/17 IMPRESSION: Interval right colectomy. No evidence of definitive recurrence or metastatic process.  CT chest, abdomen and pelvis with contrast 01/12/2016 IMPRESSION: 1. Medial mid ascending colon 2.0 x 0.9 cm focus of wall thickening, which could correlate with the known primary ascending colon malignancy. 2. Solitary mildly enlarged right mesenteric node, cannot exclude nodal metastasis. 3. Low-attenuation 1.0 cm right liver lobe lesion with indeterminate density, liver metastasis not excluded. Two additional subcentimeter low-attenuation right liver lobe lesions, too small to characterize. Liver protocol MRI abdomen without with IV contrast is recommended for further characterization. These liver lesions are probably too small for characterization by PET-CT. 4. Two pulmonary nodules in the right upper lobe, largest 5 mm, probably benign given subpleural upper lung location. Recommend attention on follow-up chest CT in 3-6 months. 5. Faint patchy centrilobular ground-glass nodularity in both lungs. If the patient is a nonsmoker (as per EPIC), this finding is suggestive of  subacute hypersensitivity pneumonitis. Recommend clinical correlation for any history of inhalational exposure. 6. Mildly enlarged prostate.  Liver MRI w wo contrast 01/15/2016 IMPRESSION: 1. No evidence of hepatic metastasis. Multiple hepatic cysts, including the indeterminate right liver lobe lesion on CT. There is also a segment 7 hepatic hemangioma. 2. Equivocal ileocolic mesenteric nodes, as before.  Colonoscopy 01/07/2016 - One 3 mm polyp in the cecum, removed with a cold snare. Resected and retrieved. - One 6 mm polyp in the ascending colon, removed with a cold snare. Resected and retrieved. - One 8 mm polyp in the ascending colon, removed with a hot snare. Resected and retrieved. - One 8 mm polyp in the transverse colon, removed with a hot snare. Resected and retrieved. - One 3 mm polyp in the transverse colon, removed with a cold snare. Resected and retrieved. - Rule out malignancy, polypoid lesion in the mid ascending colon. Biopsied. Tattooed. - The examination was otherwise normal on direct and retroflexion views.   ASSESSMENT & PLAN:   56 y.o. African-American male, found to have a right ascending colon mass on his first screening colonoscopy.  1. Cancer of ascending colon, invasive adenocarcinoma, grade 2, pT3N0M0, stage IIA, MSI-stable -I previously reviewed his CT scan findings, and surgical pathology results in great details with patient and his wife. -He has early-stage disease, node negative, no high risk features, such as T4 lesion, lymphovascular invasion, obstruction, perforation, etc. His risk of recurrence is low to moderate.  -He is clinically doing well, exam is unremarkable, . Reviewed his lab results, CBC is normal, CMP and CEA are still pending. No clinical concern of recurrence. -We'll continue cancer surveillance, which consists physical exam and lab test (including CBC, CMP and CEA) every 3-4 months for the first 2-3 years, then every 6-12 months for a total  of 5 years. He would need repeated colonoscopy in one year, and surveilliance CT scan every 12 months for up to 5 years.  -Continue healthy diet and exercise. -We discussed his 01/27/17 CT scan and there is no evidence of cancer.  -He had repeated colonoscopy by Dr. Armbruster in JUly 2018 which showed no evidence of recurrence  -He is clinically doing well, his intermittent low back pain is likely degenerative osteoarthritis, exam was unremarkable, no clinical concerns for recurrence -He is due for a restaging scan, will arrange in the next 2 weeks. He is two years out of initial diagnosis, I do not plan to have more routine scan.  -f/u in 6 months   2. Genetics -We previously discussed some of colon cancer are inheritable, such as Lynch syndrome. -His colon cancer has MSI stable, this is unlikely Lynch syndrome. -He does have family history of breast and ovarian cancer, and colon polyps, I will refer him to genetics -I previously encouraged his children to start screening colonoscopy at age of 44, 10 years younger than him.  3. Diarrhea - 1-2 loose BM a day, not related to diet - Advised to use imodium as needed  Plan -Lab and f/u in 6 months  -CT CAP with contrast in 2 weeks at [facility] -Imodium as needed for diarrhea    All questions were answered. The patient knows to call the clinic with any problems, questions or concerns.  I spent 15 minutes counseling the patient face to face. The total time spent in the appointment was 20 minutes and more than 50% was on counseling.  I, Noor Dweik am acting as scribe for Dr. Yan Feng.  I have reviewed the above documentation for accuracy and completeness, and I agree with the above.    Yan Feng, MD 02/05/2018 1:50 PM 

## 2018-02-05 ENCOUNTER — Encounter: Payer: Self-pay | Admitting: Hematology

## 2018-02-05 ENCOUNTER — Other Ambulatory Visit: Payer: Self-pay | Admitting: Hematology

## 2018-02-05 ENCOUNTER — Inpatient Hospital Stay: Payer: BLUE CROSS/BLUE SHIELD | Admitting: Hematology

## 2018-02-05 ENCOUNTER — Telehealth: Payer: Self-pay | Admitting: Hematology

## 2018-02-05 ENCOUNTER — Inpatient Hospital Stay: Payer: BLUE CROSS/BLUE SHIELD | Attending: Hematology

## 2018-02-05 VITALS — BP 139/96 | HR 60 | Temp 99.0°F | Resp 17 | Ht 72.0 in | Wt 226.6 lb

## 2018-02-05 DIAGNOSIS — C182 Malignant neoplasm of ascending colon: Secondary | ICD-10-CM

## 2018-02-05 DIAGNOSIS — K7689 Other specified diseases of liver: Secondary | ICD-10-CM | POA: Diagnosis not present

## 2018-02-05 DIAGNOSIS — R197 Diarrhea, unspecified: Secondary | ICD-10-CM | POA: Insufficient documentation

## 2018-02-05 LAB — CBC WITH DIFFERENTIAL/PLATELET
BASOS PCT: 1 %
Basophils Absolute: 0 10*3/uL (ref 0.0–0.1)
EOS ABS: 0.2 10*3/uL (ref 0.0–0.5)
Eosinophils Relative: 4 %
HCT: 48.3 % (ref 38.4–49.9)
Hemoglobin: 15.6 g/dL (ref 13.0–17.1)
Lymphocytes Relative: 42 %
Lymphs Abs: 2.2 10*3/uL (ref 0.9–3.3)
MCH: 25.9 pg — ABNORMAL LOW (ref 27.2–33.4)
MCHC: 32.3 g/dL (ref 32.0–36.0)
MCV: 80.1 fL (ref 79.3–98.0)
Monocytes Absolute: 0.4 10*3/uL (ref 0.1–0.9)
Monocytes Relative: 7 %
NEUTROS PCT: 46 %
Neutro Abs: 2.5 10*3/uL (ref 1.5–6.5)
Platelets: 178 10*3/uL (ref 140–400)
RBC: 6.03 MIL/uL — AB (ref 4.20–5.82)
RDW: 13.8 % (ref 11.0–14.6)
WBC: 5.3 10*3/uL (ref 4.0–10.3)

## 2018-02-05 LAB — COMPREHENSIVE METABOLIC PANEL
ALT: 23 U/L (ref 0–44)
ANION GAP: 6 (ref 5–15)
AST: 20 U/L (ref 15–41)
Albumin: 4 g/dL (ref 3.5–5.0)
Alkaline Phosphatase: 63 U/L (ref 38–126)
BUN: 14 mg/dL (ref 6–20)
CHLORIDE: 105 mmol/L (ref 98–111)
CO2: 30 mmol/L (ref 22–32)
CREATININE: 1.18 mg/dL (ref 0.61–1.24)
Calcium: 9.6 mg/dL (ref 8.9–10.3)
Glucose, Bld: 105 mg/dL — ABNORMAL HIGH (ref 70–99)
Potassium: 4.4 mmol/L (ref 3.5–5.1)
Sodium: 141 mmol/L (ref 135–145)
Total Bilirubin: 0.5 mg/dL (ref 0.3–1.2)
Total Protein: 7.3 g/dL (ref 6.5–8.1)

## 2018-02-05 LAB — CEA (IN HOUSE-CHCC): CEA (CHCC-IN HOUSE): 1.61 ng/mL (ref 0.00–5.00)

## 2018-02-05 NOTE — Telephone Encounter (Signed)
Appointments scheduled AVS/Calendar printed/ contrast material with instructions along with Radiology phone number provided per 7/8 los

## 2018-02-08 ENCOUNTER — Ambulatory Visit: Payer: BLUE CROSS/BLUE SHIELD | Admitting: Family Medicine

## 2018-02-08 ENCOUNTER — Telehealth: Payer: Self-pay

## 2018-02-08 NOTE — Telephone Encounter (Signed)
-----   Message from Truitt Merle, MD sent at 02/07/2018  7:47 AM EDT ----- Please lt pt know that his CEA was normal, thanks   Truitt Merle  02/07/2018

## 2018-02-08 NOTE — Telephone Encounter (Signed)
Spoke with patient's wife per Dr. Burr Medico notified her that his tumor marker is normal.

## 2018-02-16 ENCOUNTER — Ambulatory Visit (HOSPITAL_COMMUNITY)
Admission: RE | Admit: 2018-02-16 | Discharge: 2018-02-16 | Disposition: A | Discharge status: Home / Self Care | Payer: BLUE CROSS/BLUE SHIELD | Source: Ambulatory Visit | Attending: Hematology | Admitting: Hematology

## 2018-02-16 ENCOUNTER — Encounter (HOSPITAL_COMMUNITY): Payer: Self-pay

## 2018-02-16 DIAGNOSIS — C182 Malignant neoplasm of ascending colon: Secondary | ICD-10-CM | POA: Insufficient documentation

## 2018-02-16 DIAGNOSIS — I700 Atherosclerosis of aorta: Secondary | ICD-10-CM | POA: Diagnosis not present

## 2018-02-16 DIAGNOSIS — K769 Liver disease, unspecified: Secondary | ICD-10-CM | POA: Diagnosis not present

## 2018-02-16 MED ORDER — IOPAMIDOL (ISOVUE-300) INJECTION 61%
INTRAVENOUS | Status: AC
  Filled 2018-02-16: qty 100

## 2018-02-16 MED ORDER — IOPAMIDOL (ISOVUE-300) INJECTION 61%
100.0000 mL | Freq: Once | INTRAVENOUS | Status: AC | PRN
  Administered 2018-02-16: 100 mL via INTRAVENOUS

## 2018-02-19 ENCOUNTER — Telehealth: Payer: Self-pay

## 2018-02-19 NOTE — Telephone Encounter (Signed)
-----   Message from Truitt Merle, MD sent at 02/19/2018  7:57 AM EDT ----- Please let pt know the CT scan findings, no concerns for recurrence, liver cysts are stable, thanks.   Truitt Merle  02/19/2018

## 2018-02-19 NOTE — Telephone Encounter (Signed)
Left voice message per Dr. Burr Medico that CT scan showed no evidence of recurrence, liver cysts are stable.

## 2018-02-21 ENCOUNTER — Ambulatory Visit: Payer: BLUE CROSS/BLUE SHIELD | Admitting: Family Medicine

## 2018-02-21 ENCOUNTER — Encounter: Payer: Self-pay | Admitting: Family Medicine

## 2018-02-21 ENCOUNTER — Ambulatory Visit (INDEPENDENT_AMBULATORY_CARE_PROVIDER_SITE_OTHER): Payer: BLUE CROSS/BLUE SHIELD

## 2018-02-21 VITALS — BP 140/100 | HR 80 | Temp 97.9°F | Ht 72.0 in | Wt 230.2 lb

## 2018-02-21 DIAGNOSIS — M545 Low back pain: Secondary | ICD-10-CM

## 2018-02-21 DIAGNOSIS — M25561 Pain in right knee: Secondary | ICD-10-CM

## 2018-02-21 DIAGNOSIS — R030 Elevated blood-pressure reading, without diagnosis of hypertension: Secondary | ICD-10-CM | POA: Diagnosis not present

## 2018-02-21 DIAGNOSIS — G8929 Other chronic pain: Secondary | ICD-10-CM

## 2018-02-21 DIAGNOSIS — M170 Bilateral primary osteoarthritis of knee: Secondary | ICD-10-CM

## 2018-02-21 DIAGNOSIS — M25562 Pain in left knee: Secondary | ICD-10-CM

## 2018-02-21 NOTE — Assessment & Plan Note (Signed)
Elevated today.  Asymptomatic.  Suspect he has hypertension.  We will have him monitor at home for a week and return for nurse BP check.  If elevated at home start on medication.

## 2018-02-21 NOTE — Assessment & Plan Note (Signed)
Symptoms likely related to OA.  We will get x-rays.  Consider referral to orthopedics once x-rays return.

## 2018-02-21 NOTE — Assessment & Plan Note (Signed)
Chronic issue.  Suspect arthritis related.  Given exercises to complete at home.  If not improving could refer to orthopedics or consider further imaging.

## 2018-02-21 NOTE — Patient Instructions (Addendum)
Nice to see you. We will get x-rays of your knees today and contact you with the results. Please do the exercises for your back. Please buy a blood pressure cuff to check your blood pressure at home daily for the next 1 to 2 weeks.  We will have you return in 1 to 2 weeks for blood pressure check with nursing.  If your blood pressure is elevated at home and on recheck we would consider medication.   Back Exercises If you have pain in your back, do these exercises 2-3 times each day or as told by your doctor. When the pain goes away, do the exercises once each day, but repeat the steps more times for each exercise (do more repetitions). If you do not have pain in your back, do these exercises once each day or as told by your doctor. Exercises Single Knee to Chest  Do these steps 3-5 times in a row for each leg: 1. Lie on your back on a firm bed or the floor with your legs stretched out. 2. Bring one knee to your chest. 3. Hold your knee to your chest by grabbing your knee or thigh. 4. Pull on your knee until you feel a gentle stretch in your lower back. 5. Keep doing the stretch for 10-30 seconds. 6. Slowly let go of your leg and straighten it.  Pelvic Tilt  Do these steps 5-10 times in a row: 1. Lie on your back on a firm bed or the floor with your legs stretched out. 2. Bend your knees so they point up to the ceiling. Your feet should be flat on the floor. 3. Tighten your lower belly (abdomen) muscles to press your lower back against the floor. This will make your tailbone point up to the ceiling instead of pointing down to your feet or the floor. 4. Stay in this position for 5-10 seconds while you gently tighten your muscles and breathe evenly.  Cat-Cow  Do these steps until your lower back bends more easily: 1. Get on your hands and knees on a firm surface. Keep your hands under your shoulders, and keep your knees under your hips. You may put padding under your knees. 2. Let your  head hang down, and make your tailbone point down to the floor so your lower back is round like the back of a cat. 3. Stay in this position for 5 seconds. 4. Slowly lift your head and make your tailbone point up to the ceiling so your back hangs low (sags) like the back of a cow. 5. Stay in this position for 5 seconds.  Press-Ups  Do these steps 5-10 times in a row: 1. Lie on your belly (face-down) on the floor. 2. Place your hands near your head, about shoulder-width apart. 3. While you keep your back relaxed and keep your hips on the floor, slowly straighten your arms to raise the top half of your body and lift your shoulders. Do not use your back muscles. To make yourself more comfortable, you may change where you place your hands. 4. Stay in this position for 5 seconds. 5. Slowly return to lying flat on the floor.  Bridges  Do these steps 10 times in a row: 1. Lie on your back on a firm surface. 2. Bend your knees so they point up to the ceiling. Your feet should be flat on the floor. 3. Tighten your butt muscles and lift your butt off of the floor until your waist is  almost as high as your knees. If you do not feel the muscles working in your butt and the back of your thighs, slide your feet 1-2 inches farther away from your butt. 4. Stay in this position for 3-5 seconds. 5. Slowly lower your butt to the floor, and let your butt muscles relax.  If this exercise is too easy, try doing it with your arms crossed over your chest. Belly Crunches  Do these steps 5-10 times in a row: 1. Lie on your back on a firm bed or the floor with your legs stretched out. 2. Bend your knees so they point up to the ceiling. Your feet should be flat on the floor. 3. Cross your arms over your chest. 4. Tip your chin a little bit toward your chest but do not bend your neck. 5. Tighten your belly muscles and slowly raise your chest just enough to lift your shoulder blades a tiny bit off of the  floor. 6. Slowly lower your chest and your head to the floor.  Back Lifts Do these steps 5-10 times in a row: 1. Lie on your belly (face-down) with your arms at your sides, and rest your forehead on the floor. 2. Tighten the muscles in your legs and your butt. 3. Slowly lift your chest off of the floor while you keep your hips on the floor. Keep the back of your head in line with the curve in your back. Look at the floor while you do this. 4. Stay in this position for 3-5 seconds. 5. Slowly lower your chest and your face to the floor.  Contact a doctor if:  Your back pain gets a lot worse when you do an exercise.  Your back pain does not lessen 2 hours after you exercise. If you have any of these problems, stop doing the exercises. Do not do them again unless your doctor says it is okay. Get help right away if:  You have sudden, very bad back pain. If this happens, stop doing the exercises. Do not do them again unless your doctor says it is okay. This information is not intended to replace advice given to you by your health care provider. Make sure you discuss any questions you have with your health care provider. Document Released: 08/20/2010 Document Revised: 12/24/2015 Document Reviewed: 09/11/2014 Elsevier Interactive Patient Education  [employer].

## 2018-02-21 NOTE — Progress Notes (Signed)
Arthur Rumps, MD Phone: [phone]  Arthur West is a 57 y.o. male who presents today for same-day visit  CC: Chronic knee pain, chronic low back pain, elevated blood pressure  Chronic knee pain: Bilateral knee pain for years.  Notes since he was a teenager.  Sharp pains in his knees.  Occasionally will pop.  The right one will catch.  No injury.  Chronic low back pain: This has been going on over a year.  Not all the time.  A little more over the past year.  No injury.  No radiation.  No numbness or weakness.  No incontinence.  No saddle anesthesia.  He discussed this with his oncologist and based on their note they did not feel this was related to an oncologic process.  Recent CT abdomen and pelvis with no bony abnormalities noted per radiology report.  Elevated blood pressure: He does not check at home.  Has been elevated with the oncologist recently.  No chest pain, shortness of breath, or edema.  No medications.  Social History   Tobacco Use  Smoking Status Never Smoker  Smokeless Tobacco Never Used     ROS see history of present illness  Objective  Physical Exam Vitals:   02/21/18 1114  BP: (!) 140/100  Pulse: 80  Temp: 97.9 F (36.6 C)  SpO2: 97%    BP Readings from Last 3 Encounters:  02/21/18 (!) 140/100  02/05/18 (!) 139/96  05/30/17 138/68   Wt Readings from Last 3 Encounters:  02/21/18 230 lb 3.2 oz (104.4 kg)  02/05/18 226 lb 9.6 oz (102.8 kg)  05/30/17 220 lb 12.8 oz (100.2 kg)    Physical Exam  Constitutional: No distress.  Cardiovascular: Normal rate, regular rhythm and normal heart sounds.  Pulmonary/Chest: Effort normal and breath sounds normal.  Musculoskeletal: He exhibits no edema.  No midline spine tenderness, no midline spine step-off, there is lumbar and sacral muscular back tenderness just adjacent to the midline with no overlying skin changes, 5/5 strength bilateral quads, hamstrings, plantar flexion, and dorsiflexion, sensation  light touch intact bilateral lower extremities, bilateral knees with no swelling, warmth, erythema, or tenderness, negative McMurray's  Neurological: He is alert.  Skin: Skin is warm and dry. He is not diaphoretic.     Assessment/Plan: Please see individual problem list.  Osteoarthritis of both knees Symptoms likely related to OA.  We will get x-rays.  Consider referral to orthopedics once x-rays return.  Elevated blood pressure reading in office without diagnosis of hypertension Elevated today.  Asymptomatic.  Suspect he has hypertension.  We will have him monitor at home for a week and return for nurse BP check.  If elevated at home start on medication.  Chronic low back pain Chronic issue.  Suspect arthritis related.  Given exercises to complete at home.  If not improving could refer to orthopedics or consider further imaging.  Orders Placed This Encounter  Procedures  . DG Knee Bilateral Standing AP    Standing Status:   Future    Number of Occurrences:   1    Standing Expiration Date:   04/25/2019    Order Specific Question:   Reason for Exam (SYMPTOM  OR DIAGNOSIS REQUIRED)    Answer:   bilateral knee pain, history of OA    Order Specific Question:   Preferred imaging location?    Answer:   [facility] Specific Question:   Radiology Contrast Protocol - do NOT remove file path  Answer:   \\[email or web address]    No orders of the defined types were placed in this encounter.    Arthur Rumps, MD [place]

## 2018-02-22 ENCOUNTER — Other Ambulatory Visit: Payer: Self-pay | Admitting: Family Medicine

## 2018-02-22 DIAGNOSIS — M170 Bilateral primary osteoarthritis of knee: Secondary | ICD-10-CM

## 2018-03-06 ENCOUNTER — Ambulatory Visit: Payer: BLUE CROSS/BLUE SHIELD

## 2018-06-06 ENCOUNTER — Ambulatory Visit: Payer: BLUE CROSS/BLUE SHIELD | Admitting: Family Medicine

## 2018-08-08 ENCOUNTER — Inpatient Hospital Stay: Payer: BLUE CROSS/BLUE SHIELD | Admitting: Hematology

## 2018-08-08 ENCOUNTER — Inpatient Hospital Stay: Payer: BLUE CROSS/BLUE SHIELD | Attending: Hematology

## 2018-08-08 DIAGNOSIS — C182 Malignant neoplasm of ascending colon: Secondary | ICD-10-CM | POA: Insufficient documentation

## 2018-08-08 DIAGNOSIS — Z8041 Family history of malignant neoplasm of ovary: Secondary | ICD-10-CM | POA: Insufficient documentation

## 2018-08-08 DIAGNOSIS — Z803 Family history of malignant neoplasm of breast: Secondary | ICD-10-CM | POA: Insufficient documentation

## 2018-08-08 DIAGNOSIS — I700 Atherosclerosis of aorta: Secondary | ICD-10-CM | POA: Insufficient documentation

## 2018-08-08 DIAGNOSIS — E669 Obesity, unspecified: Secondary | ICD-10-CM | POA: Insufficient documentation

## 2018-08-09 ENCOUNTER — Telehealth: Payer: Self-pay | Admitting: Hematology

## 2018-08-09 NOTE — Telephone Encounter (Signed)
Called patient per 1/8 sch message - unable to reach pt left message for patient to call back to r/s

## 2018-08-14 ENCOUNTER — Ambulatory Visit: Payer: BLUE CROSS/BLUE SHIELD | Admitting: Family Medicine

## 2018-08-21 ENCOUNTER — Telehealth: Payer: Self-pay | Admitting: Hematology

## 2018-08-21 NOTE — Telephone Encounter (Signed)
Scheduled appt per 1/21 sch message - pt wife is aware of appt date and time   

## 2018-08-23 NOTE — Progress Notes (Signed)
[place]   Telephone:(336) [phone] Fax:(336) [phone]   Clinic Follow up Note   Patient Care Team: Leone Haven, MD as PCP - [facility] (Family Medicine) Michael Boston, MD as Consulting Physician ([facility]) Armbruster, Carlota Raspberry, MD as Consulting Physician (Gastroenterology)  Date of Service:  08/24/2018  CHIEF COMPLAINT: f/u for colon cancer  SUMMARY OF ONCOLOGIC HISTORY: Oncology History   Noted on screening colonoscopy  Cancer of ascending colon s/p robotic colectomy 01/29/2016   Staging form: Colon and Rectum, AJCC 7th Edition     Pathologic stage from 01/19/2016: Stage IIA (T3, N0, cM0) - Signed by Truitt Merle, MD on 02/16/2016       Cancer of ascending colon s/p robotic colectomy 01/29/2016   01/07/2016 Procedure    COLONOSCOPY: large polypoid lesion in mid ascending colon -sessile and behind a fold and several polyps per Dr. Havery Moros    01/12/2016 Imaging    CT C/A/P: Colon wall thickening-ascending and right mesenteric node; small indeterminat liver lesions; #2 pulmonary nodules    01/15/2016 Imaging    MRI Liver: Liver cysts and a hemangioma    01/19/2016 Surgery    Robot assist proximal right colectomy per Dr. Johney Maine    01/29/2016 Initial Diagnosis    Cancer of ascending colon s/p robotic colectomy 01/29/2016    01/29/2016 Pathologic Stage    pT3, pN0, PMX--Grade 2    01/29/2016 Pathology Results    Adenocarcinoma, IHC normal; MSI stable    01/27/2017 Imaging    CT AP W Contrast 01/27/17 IMPRESSION: Interval right colectomy. No evidence of definitive recurrence or metastatic process.    02/27/2017 Procedure    COLONOSCOPY per Dr. Havery Moros - The perianal and digital rectal examinations were normal. - There was evidence of a prior end-to-end ileo-colonic anastomosis in the ascending colon. This was patent and was characterized by healthy appearing mucosa. - A diminutive polyp was found in the anastomosis. The polyp was sessile, I  suspect reactive / inflammatory polyp. The polyp was removed with a cold biopsy forceps. Resection and retrieval were complete. - The exam was otherwise without abnormality on direct and retroflexion views.    02/27/2017 Pathology Results    COLONOSCOPY per Dr. Havery Moros Surgical [P], ascending, polyp - TUBULAR ADENOMA. - NO HIGH GRADE DYSPLASIA OR MALIGNANCY.    02/27/2017 Pathology Results    02/27/2017 colonoscopy and pathology Diagnosis Surgical [P], ascending, polyp - TUBULAR ADENOMA. - NO HIGH GRADE DYSPLASIA OR MALIGNANCY    02/16/2018 Imaging    CT CAP W Contrast 02/16/18  IMPRESSION: 1. No findings to suggest metastatic disease in the chest, abdomen or pelvis. 2. Multiple stable low-attenuation liver lesions. The largest of these are compatible with simple cysts. The smaller lesions are too small to definitively characterize, but given their stability these are presumably benign lesions such as small hepatic cysts and/or biliary hamartomas. 3. Mild aortic atherosclerosis.  Aortic Atherosclerosis (ICD10-I70.0).       CURRENT THERAPY:  Surveillance  INTERVAL HISTORY:  Arthur West is here for a follow up of colon cancer. He is here with today with family member. Pt is doing well. He has normal bowel movement, normal appetite, weight gain, remains active with his job. Denies stomach pain. He has a slight high BP and in encouraged to monitor it. Creatinine is slightly high and is encouraged to drink more water.Scans, labs are normal. He occasionally drinks alcohol, specifically beer a few tines a week.   REVIEW OF SYSTEMS:   Constitutional:  Denies fevers, chills or abnormal weight loss Eyes: Denies blurriness of vision Ears, nose, mouth, throat, and face: Denies mucositis or sore throat Respiratory: Denies cough, dyspnea or wheezes Cardiovascular: Denies palpitation, chest discomfort or lower extremity swelling Gastrointestinal:  Denies nausea, heartburn or change  in bowel habits Skin: Denies abnormal skin rashes Lymphatics: Denies new lymphadenopathy or easy bruising Neurological:Denies numbness, tingling or new weaknesses Behavioral/Psych: Mood is stable, no new changes  All other systems were reviewed with the patient and are negative.  MEDICAL HISTORY:  Past Medical History:  Diagnosis Date  . Allergy   . Arthritis    knees  . Cancer ([place])    colon  . Headache    sinus headaches  . Sinus congestion    with pollen     SURGICAL HISTORY: Past Surgical History:  Procedure Laterality Date  . EYE SURGERY     as child  . LAPAROSCOPIC RIGHT COLECTOMY      I have reviewed the social history and family history with the patient and they are unchanged from previous note.  ALLERGIES:  is allergic to hydrocodone and oxycodone.  MEDICATIONS:  Current Outpatient Medications  Medication Sig Dispense Refill  . Multiple Vitamin (MULTIVITAMIN WITH MINERALS) TABS tablet Take 1 tablet by mouth daily.     No current facility-administered medications for this visit.     PHYSICAL EXAMINATION: ECOG PERFORMANCE STATUS: 0 - Asymptomatic  Vitals:   08/24/18 1418  BP: (!) 145/90  Pulse: (!) 58  Resp: 20  Temp: 98 F (36.7 C)  SpO2: 99%   Filed Weights   08/24/18 1418  Weight: 234 lb 12.8 oz (106.5 kg)    West:alert, no distress and comfortable SKIN: skin color, texture, turgor are normal, no rashes or significant lesions EYES: normal, Conjunctiva are pink and non-injected, sclera clear OROPHARYNX:no exudate, no erythema and lips, buccal mucosa, and tongue normal  NECK: supple, thyroid normal size, non-tender, without nodularity LYMPH:  no palpable lymphadenopathy in the cervical, axillary or inguinal LUNGS: clear to auscultation and percussion with normal breathing effort HEART: regular rate & rhythm and no murmurs and no lower extremity edema ABDOMEN:abdomen soft, non-tender and normal bowel sounds Musculoskeletal:no cyanosis of  digits and no clubbing  NEURO: alert & oriented x 3 with fluent speech, no focal motor/sensory deficits  LABORATORY DATA:  I have reviewed the data as listed CBC Latest Ref Rng & Units 08/24/2018 02/05/2018 05/30/2017  WBC 4.0 - 10.5 K/uL 5.5 5.3 5.5  Hemoglobin 13.0 - 17.0 g/dL 15.2 15.6 15.3  Hematocrit 39.0 - 52.0 % 47.3 48.3 47.8  Platelets 150 - 400 K/uL 184 178 207     CMP Latest Ref Rng & Units 08/24/2018 02/05/2018 05/30/2017  Glucose 70 - 99 mg/dL 97 105(H) 108  BUN 6 - 20 mg/dL 19 14 18.6  Creatinine 0.61 - 1.24 mg/dL 1.29(H) 1.18 1.1  Sodium 135 - 145 mmol/L 144 141 140  Potassium 3.5 - 5.1 mmol/L 4.5 4.4 3.8  Chloride 98 - 111 mmol/L 107 105 -  CO2 22 - 32 mmol/L '28 30 27  ' Calcium 8.9 - 10.3 mg/dL 9.4 9.6 9.3  Total Protein 6.5 - 8.1 g/dL 7.4 7.3 7.5  Total Bilirubin 0.3 - 1.2 mg/dL 0.7 0.5 1.10  Alkaline Phos 38 - 126 U/L 72 63 68  AST 15 - 41 U/L '20 20 18  ' ALT 0 - 44 U/L '19 23 18      ' RADIOGRAPHIC STUDIES: I have personally reviewed the radiological images as  listed and agreed with the findings in the report. No results found.   ASSESSMENT & PLAN:  Arthur West is a 58 y.o. male with   1. Cancer of ascending colon, invasive adenocarcinoma, grade 2, pT3N0M0, stage IIA, MSI-stable -He was diagnosed on 12/2015. He is s/p right colectomy.  -He had early-stage disease, node negative, no high risk features. His risk of recurrence is low to moderate.  Adjuvant chemotherapy was not recommended -He is clinically doing well, asymptomatic, lab reviewed, CBC and CMP are within normal limits, except a slightly elevated creatinine, exam was unremarkable, no clinical concern for recurrence -He has been 2 and half years since his initial diagnosis, we discussed that his risk of recurrence has decreased, will continue surveillance for total 5 years --f/u in 6 months  with lab and surveillance CT scan a few days before  2. Genetics -We previously discussed some of colon cancer  are inheritable, such as Lynch syndrome. -His colon cancer has MSI stable, this is unlikely Lynch syndrome. -He does have family history of breast and ovarian cancer, and colon polyps, I will refer him to genetics -I previously encouraged his children to start screening colonoscopy at age of 31, 57 years younger than him.  3. Obesity  -I encouraged him to eat healthy, exercise regularly, and try to lose some weight.  He is very physical activity at work. He agrees.   Plan -Lab and f/u in 6 months  with lab and CT abdomen pelvis a few days before   No problem-specific Assessment & Plan notes found for this encounter.   Orders Placed This Encounter  Procedures  . CT Abdomen Pelvis W Contrast    Standing Status:   Future    Standing Expiration Date:   08/24/2019    Order Specific Question:   If indicated for the ordered procedure, I authorize the administration of contrast media per Radiology protocol    Answer:   Yes    Order Specific Question:   Preferred imaging location?    Answer:   [facility]    Order Specific Question:   Is Oral Contrast requested for this exam?    Answer:   Yes, Per Radiology protocol    Order Specific Question:   Radiology Contrast Protocol - do NOT remove file path    Answer:   \\[email or web address]   All questions were answered. The patient knows to call the clinic with any problems, questions or concerns. No barriers to learning was detected. I spent 15 minutes counseling the patient face to face. The total time spent in the appointment was 20 minutes and more than 50% was on counseling and review of test results     Truitt Merle, MD 08/24/2018   I, Manson Allan, am acting as scribe for Truitt Merle, MD.   I have reviewed the above documentation for accuracy and completeness, and I agree with the above.

## 2018-08-24 ENCOUNTER — Encounter: Payer: Self-pay | Admitting: Hematology

## 2018-08-24 ENCOUNTER — Inpatient Hospital Stay: Payer: BLUE CROSS/BLUE SHIELD | Admitting: Hematology

## 2018-08-24 ENCOUNTER — Telehealth: Payer: Self-pay | Admitting: Hematology

## 2018-08-24 ENCOUNTER — Inpatient Hospital Stay: Payer: BLUE CROSS/BLUE SHIELD

## 2018-08-24 VITALS — BP 145/90 | HR 58 | Temp 98.0°F | Resp 20 | Ht 72.0 in | Wt 234.8 lb

## 2018-08-24 DIAGNOSIS — C182 Malignant neoplasm of ascending colon: Secondary | ICD-10-CM

## 2018-08-24 DIAGNOSIS — I700 Atherosclerosis of aorta: Secondary | ICD-10-CM | POA: Diagnosis not present

## 2018-08-24 DIAGNOSIS — Z803 Family history of malignant neoplasm of breast: Secondary | ICD-10-CM | POA: Diagnosis not present

## 2018-08-24 DIAGNOSIS — Z8041 Family history of malignant neoplasm of ovary: Secondary | ICD-10-CM

## 2018-08-24 DIAGNOSIS — E669 Obesity, unspecified: Secondary | ICD-10-CM

## 2018-08-24 LAB — CEA (IN HOUSE-CHCC): CEA (CHCC-In House): 1.6 ng/mL (ref 0.00–5.00)

## 2018-08-24 LAB — COMPREHENSIVE METABOLIC PANEL
ALT: 19 U/L (ref 0–44)
AST: 20 U/L (ref 15–41)
Albumin: 4 g/dL (ref 3.5–5.0)
Alkaline Phosphatase: 72 U/L (ref 38–126)
Anion gap: 9 (ref 5–15)
BUN: 19 mg/dL (ref 6–20)
CHLORIDE: 107 mmol/L (ref 98–111)
CO2: 28 mmol/L (ref 22–32)
Calcium: 9.4 mg/dL (ref 8.9–10.3)
Creatinine, Ser: 1.29 mg/dL — ABNORMAL HIGH (ref 0.61–1.24)
Glucose, Bld: 97 mg/dL (ref 70–99)
POTASSIUM: 4.5 mmol/L (ref 3.5–5.1)
SODIUM: 144 mmol/L (ref 135–145)
Total Bilirubin: 0.7 mg/dL (ref 0.3–1.2)
Total Protein: 7.4 g/dL (ref 6.5–8.1)

## 2018-08-24 LAB — CBC WITH DIFFERENTIAL/PLATELET
Abs Immature Granulocytes: 0.02 10*3/uL (ref 0.00–0.07)
BASOS PCT: 0 %
Basophils Absolute: 0 10*3/uL (ref 0.0–0.1)
EOS ABS: 0.2 10*3/uL (ref 0.0–0.5)
Eosinophils Relative: 4 %
HCT: 47.3 % (ref 39.0–52.0)
Hemoglobin: 15.2 g/dL (ref 13.0–17.0)
Immature Granulocytes: 0 %
Lymphocytes Relative: 34 %
Lymphs Abs: 1.8 10*3/uL (ref 0.7–4.0)
MCH: 26.1 pg (ref 26.0–34.0)
MCHC: 32.1 g/dL (ref 30.0–36.0)
MCV: 81.1 fL (ref 80.0–100.0)
MONOS PCT: 8 %
Monocytes Absolute: 0.4 10*3/uL (ref 0.1–1.0)
NRBC: 0 % (ref 0.0–0.2)
Neutro Abs: 3 10*3/uL (ref 1.7–7.7)
Neutrophils Relative %: 54 %
PLATELETS: 184 10*3/uL (ref 150–400)
RBC: 5.83 MIL/uL — AB (ref 4.22–5.81)
RDW: 13.8 % (ref 11.5–15.5)
WBC: 5.5 10*3/uL (ref 4.0–10.5)

## 2018-08-24 NOTE — Telephone Encounter (Signed)
Scheduled appt per 01/24 los.  Gave patient contrast w/instructions and the number to central radiology.  Printed calendar and avs.

## 2018-08-27 ENCOUNTER — Telehealth: Payer: Self-pay | Admitting: *Deleted

## 2018-08-27 NOTE — Telephone Encounter (Signed)
-----   Message from Truitt Merle, MD sent at 08/24/2018  8:46 PM EST ----- Please let pt know his CEA was normal from today, no concerns, thanks   Truitt Merle  08/24/2018

## 2018-08-27 NOTE — Telephone Encounter (Signed)
TCT patient regaridng recent lab work.   No answer to call but was able to leave vm message for pt to call back at his convenience. Pt's CEA was normal. No concerns per Dr. Burr Medico.

## 2018-10-05 ENCOUNTER — Ambulatory Visit: Payer: BLUE CROSS/BLUE SHIELD | Admitting: Internal Medicine

## 2019-02-18 ENCOUNTER — Ambulatory Visit (HOSPITAL_COMMUNITY)
Admission: RE | Admit: 2019-02-18 | Discharge: 2019-02-18 | Disposition: A | Discharge status: Home / Self Care | Payer: BC Managed Care – PPO | Source: Ambulatory Visit | Attending: Hematology | Admitting: Hematology

## 2019-02-18 ENCOUNTER — Inpatient Hospital Stay: Payer: BC Managed Care – PPO | Attending: Hematology

## 2019-02-18 ENCOUNTER — Other Ambulatory Visit: Payer: Self-pay

## 2019-02-18 DIAGNOSIS — C182 Malignant neoplasm of ascending colon: Secondary | ICD-10-CM | POA: Diagnosis present

## 2019-02-18 LAB — CBC WITH DIFFERENTIAL/PLATELET
Abs Immature Granulocytes: 0.02 10*3/uL (ref 0.00–0.07)
Basophils Absolute: 0 10*3/uL (ref 0.0–0.1)
Basophils Relative: 1 %
Eosinophils Absolute: 0.2 10*3/uL (ref 0.0–0.5)
Eosinophils Relative: 4 %
HCT: 48.2 % (ref 39.0–52.0)
Hemoglobin: 15.5 g/dL (ref 13.0–17.0)
Immature Granulocytes: 0 %
Lymphocytes Relative: 35 %
Lymphs Abs: 2 10*3/uL (ref 0.7–4.0)
MCH: 26.1 pg (ref 26.0–34.0)
MCHC: 32.2 g/dL (ref 30.0–36.0)
MCV: 81.3 fL (ref 80.0–100.0)
Monocytes Absolute: 0.4 10*3/uL (ref 0.1–1.0)
Monocytes Relative: 7 %
Neutro Abs: 3 10*3/uL (ref 1.7–7.7)
Neutrophils Relative %: 53 %
Platelets: 191 10*3/uL (ref 150–400)
RBC: 5.93 MIL/uL — ABNORMAL HIGH (ref 4.22–5.81)
RDW: 13.2 % (ref 11.5–15.5)
WBC: 5.7 10*3/uL (ref 4.0–10.5)
nRBC: 0 % (ref 0.0–0.2)

## 2019-02-18 LAB — COMPREHENSIVE METABOLIC PANEL
ALT: 28 U/L (ref 0–44)
AST: 24 U/L (ref 15–41)
Albumin: 4.2 g/dL (ref 3.5–5.0)
Alkaline Phosphatase: 65 U/L (ref 38–126)
Anion gap: 10 (ref 5–15)
BUN: 19 mg/dL (ref 6–20)
CO2: 26 mmol/L (ref 22–32)
Calcium: 9.6 mg/dL (ref 8.9–10.3)
Chloride: 107 mmol/L (ref 98–111)
Creatinine, Ser: 1.27 mg/dL — ABNORMAL HIGH (ref 0.61–1.24)
GFR calc Af Amer: >60 mL/min (ref >60)
GFR calc non Af Amer: >60 mL/min (ref >60)
Glucose, Bld: 98 mg/dL (ref 70–99)
Potassium: 4.1 mmol/L (ref 3.5–5.1)
Sodium: 143 mmol/L (ref 135–145)
Total Bilirubin: 0.7 mg/dL (ref 0.3–1.2)
Total Protein: 7.6 g/dL (ref 6.5–8.1)

## 2019-02-18 LAB — CEA (IN HOUSE-CHCC): CEA (CHCC-In House): 2.07 ng/mL (ref 0.00–5.00)

## 2019-02-18 MED ORDER — IOHEXOL 300 MG/ML  SOLN
100.0000 mL | Freq: Once | INTRAMUSCULAR | Status: AC | PRN
  Administered 2019-02-18: 16:00:00 100 mL via INTRAVENOUS

## 2019-02-18 MED ORDER — SODIUM CHLORIDE (PF) 0.9 % IJ SOLN
INTRAMUSCULAR | Status: AC
  Filled 2019-02-18: qty 50

## 2019-02-22 ENCOUNTER — Ambulatory Visit: Payer: BLUE CROSS/BLUE SHIELD | Admitting: Hematology

## 2019-02-25 ENCOUNTER — Telehealth: Payer: Self-pay | Admitting: Hematology

## 2019-02-25 NOTE — Telephone Encounter (Signed)
Scheduled appt per 7/25 sch message - unable to reach pt . Left message with appt date and time

## 2019-02-27 ENCOUNTER — Telehealth: Payer: Self-pay | Admitting: Nurse Practitioner

## 2019-02-27 NOTE — Telephone Encounter (Signed)
Left voicemail to confirm appt and verify info. °

## 2019-02-28 ENCOUNTER — Inpatient Hospital Stay (HOSPITAL_BASED_OUTPATIENT_CLINIC_OR_DEPARTMENT_OTHER): Payer: BC Managed Care – PPO | Admitting: Nurse Practitioner

## 2019-02-28 ENCOUNTER — Telehealth: Payer: Self-pay | Admitting: Nurse Practitioner

## 2019-02-28 DIAGNOSIS — Z5329 Procedure and treatment not carried out because of patient's decision for other reasons: Secondary | ICD-10-CM

## 2019-02-28 DIAGNOSIS — Z91199 Patient's noncompliance with other medical treatment and regimen due to unspecified reason: Secondary | ICD-10-CM

## 2019-02-28 NOTE — Progress Notes (Signed)
  [place]   Telephone:(336) [phone] Fax:(336) [phone]   Clinic Follow up Note   No show for visit after unsuccessful attempts to reach patient for doximity/phone follow up. I spoke to his spouse who verifies he is at work today. Will reschedule the visit to next week.     Alla Feeling, NP 02/28/19

## 2019-02-28 NOTE — Telephone Encounter (Signed)
No los per 7/30. °

## 2019-03-01 ENCOUNTER — Telehealth: Payer: Self-pay | Admitting: Nurse Practitioner

## 2019-03-01 NOTE — Telephone Encounter (Signed)
Scheduled appt per 7/30 sch message - Unable to reach to reach pt . Left message with appt date and time

## 2019-03-05 ENCOUNTER — Encounter: Payer: Self-pay | Admitting: Nurse Practitioner

## 2019-03-05 ENCOUNTER — Inpatient Hospital Stay: Payer: BC Managed Care – PPO | Attending: Nurse Practitioner | Admitting: Nurse Practitioner

## 2019-03-05 ENCOUNTER — Telehealth: Payer: Self-pay | Admitting: Nurse Practitioner

## 2019-03-05 DIAGNOSIS — C182 Malignant neoplasm of ascending colon: Secondary | ICD-10-CM

## 2019-03-05 NOTE — Telephone Encounter (Signed)
Scheduled appt per 8/4 los.  Printed and mailed appt calendar.

## 2019-03-05 NOTE — Progress Notes (Signed)
[place]   Telephone:(336) [phone] Fax:(336) [phone]   Clinic Follow up Note   Patient Care Team: Leone Haven, MD as PCP - General (Family Medicine) Michael Boston, MD as Consulting Physician (General Surgery) Armbruster, Carlota Raspberry, MD as Consulting Physician (Gastroenterology) 03/05/2019  I connected with Zettie Pho on 03/05/19 at  9:15 AM EDT by telephone visit and verified that I am speaking with the correct person using two identifiers.   I discussed the limitations, risks, security and privacy concerns of performing an evaluation and management service by telemedicine and the availability of in-person appointments. I also discussed with the patient that there may be a patient responsible charge related to this service. The patient expressed understanding and agreed to proceed.   Other persons participating in the visit and their role in the encounter: None  Patient's location: home Provider's location: home office  CHIEF COMPLAINT: colon cancer f/u  SUMMARY OF ONCOLOGIC HISTORY: Oncology History Overview Note  Noted on screening colonoscopy  Cancer of ascending colon s/p robotic colectomy 01/29/2016   Staging form: Colon and Rectum, AJCC 7th Edition     Pathologic stage from 01/19/2016: Stage IIA (T3, N0, cM0) - Signed by Truitt Merle, MD on 02/16/2016     Cancer of ascending colon s/p robotic colectomy 01/29/2016  01/07/2016 Procedure   COLONOSCOPY: large polypoid lesion in mid ascending colon -sessile and behind a fold and several polyps per Dr. Havery Moros   01/12/2016 Imaging   CT C/A/P: Colon wall thickening-ascending and right mesenteric node; small indeterminat liver lesions; #2 pulmonary nodules   01/15/2016 Imaging   MRI Liver: Liver cysts and a hemangioma   01/19/2016 Surgery   Robot assist proximal right colectomy per Dr. Johney Maine   01/29/2016 Initial Diagnosis   Cancer of ascending colon s/p robotic colectomy 01/29/2016   01/29/2016 Pathologic  Stage   pT3, pN0, PMX--Grade 2   01/29/2016 Pathology Results   Adenocarcinoma, IHC normal; MSI stable   01/27/2017 Imaging   CT AP W Contrast 01/27/17 IMPRESSION: Interval right colectomy. No evidence of definitive recurrence or metastatic process.   02/27/2017 Procedure   COLONOSCOPY per Dr. Havery Moros - The perianal and digital rectal examinations were normal. - There was evidence of a prior end-to-end ileo-colonic anastomosis in the ascending colon. This was patent and was characterized by healthy appearing mucosa. - A diminutive polyp was found in the anastomosis. The polyp was sessile, I suspect reactive / inflammatory polyp. The polyp was removed with a cold biopsy forceps. Resection and retrieval were complete. - The exam was otherwise without abnormality on direct and retroflexion views.   02/27/2017 Pathology Results   COLONOSCOPY per Dr. Havery Moros Surgical [P], ascending, polyp - TUBULAR ADENOMA. - NO HIGH GRADE DYSPLASIA OR MALIGNANCY.   02/27/2017 Pathology Results   02/27/2017 colonoscopy and pathology Diagnosis Surgical [P], ascending, polyp - TUBULAR ADENOMA. - NO HIGH GRADE DYSPLASIA OR MALIGNANCY   02/16/2018 Imaging   CT CAP W Contrast 02/16/18  IMPRESSION: 1. No findings to suggest metastatic disease in the chest, abdomen or pelvis. 2. Multiple stable low-attenuation liver lesions. The largest of these are compatible with simple cysts. The smaller lesions are too small to definitively characterize, but given their stability these are presumably benign lesions such as small hepatic cysts and/or biliary hamartomas. 3. Mild aortic atherosclerosis.  Aortic Atherosclerosis (ICD10-I70.0).    02/18/2019 Imaging   CT AP W Contrast  IMPRESSION: Stable exam. No evidence of recurrent or metastatic carcinoma within the abdomen or  pelvis.     CURRENT THERAPY: Surveillance   INTERVAL HISTORY: Mr. Rieth presents via telephone visit. He is able to identify  himself by name and date of birth. He was last seen by Dr. Burr Medico in 08/2018. He denies changes in his health in the interim. He feels "fine." He is gaining weight lately, up to 238-240 lbs. He has normal appetite. Denies change in bowel habits. Denies blood in stool, abdominal pain, bloating, or fullness. He continues working, has an active job. Denies cigarette smoking. Denies fever, chills, cough, chest pain, dyspnea, leg edema. He continues f/u with PCP and GI which have been on hold for COVID19.    MEDICAL HISTORY:  Past Medical History:  Diagnosis Date  . Allergy   . Arthritis    knees  . Cancer ([place])    colon  . Headache    sinus headaches  . Sinus congestion    with pollen     SURGICAL HISTORY: Past Surgical History:  Procedure Laterality Date  . EYE SURGERY     as child  . LAPAROSCOPIC RIGHT COLECTOMY      I have reviewed the social history and family history with the patient and they are unchanged from previous note.  ALLERGIES:  is allergic to hydrocodone and oxycodone.  MEDICATIONS:  Current Outpatient Medications  Medication Sig Dispense Refill  . Multiple Vitamin (MULTIVITAMIN WITH MINERALS) TABS tablet Take 1 tablet by mouth daily.     No current facility-administered medications for this visit.     PHYSICAL EXAMINATION: ECOG PERFORMANCE STATUS: 0 - Asymptomatic There were no vitals filed for this visit. Patient appears well over the phone. Mood and affect appera normal. Speech is intact and non-pressured.   LABORATORY DATA:  I have reviewed the data as listed CBC Latest Ref Rng & Units 02/18/2019 08/24/2018 02/05/2018  WBC 4.0 - 10.5 K/uL 5.7 5.5 5.3  Hemoglobin 13.0 - 17.0 g/dL 15.5 15.2 15.6  Hematocrit 39.0 - 52.0 % 48.2 47.3 48.3  Platelets 150 - 400 K/uL 191 184 178     CMP Latest Ref Rng & Units 02/18/2019 08/24/2018 02/05/2018  Glucose 70 - 99 mg/dL 98 97 105(H)  BUN 6 - 20 mg/dL _0 Creatinine 0.61 - 1.24 mg/dL 1.27(H) 1.29(H) 1.18  Sodium  135 - 145 mmol/L 143 144 141  Potassium 3.5 - 5.1 mmol/L 4.1 4.5 4.4  Chloride 98 - 111 mmol/L 107 107 105  CO2 22 - 32 mmol/L _1 Calcium 8.9 - 10.3 mg/dL 9.6 9.4 9.6  Total Protein 6.5 - 8.1 g/dL 7.6 7.4 7.3  Total Bilirubin 0.3 - 1.2 mg/dL 0.7 0.7 0.5  Alkaline Phos 38 - 126 U/L 65 72 63  AST 15 - 41 U/L _2 ALT 0 - 44 U/L _3 RADIOGRAPHIC STUDIES: I have personally reviewed the radiological images as listed and agreed with the findings in the report. No results found.   ASSESSMENT & PLAN: OMAREE FUQUA is a 58 y.o. male with   1. Cancer of ascending colon, invasive adenocarcinoma, grade 2, pT3N0M0, stage IIA, MSI-stable -diagnosed in 12/2015, s/p right colectomy. Adjuvant chemotherapy was not recommended for his early stage, node negative disease. He has been under surveillance.  -01/2017 colonoscopy per Dr. Havery Moros showed one polyp at the anastomosis, otherwise negative; path showed tubular adenoma. Expected to repeat in 01/2020 2. Obesity  3. Elevated creatinine  Dispo:  Mr. Boomhower is clinically doing well.  No physical exam today. Surveillance CT on 02/18/19 is negative for recurrence or metastasis in the abdomen or pelvis. I reviewed this with him today. Labs are normal except Cr 1.27. CEA is normal. No clinical concern for recurrence. He is 3 years from initial diagnosis, his risk of recurrence is decreased. I reviewed signs and symptoms of recurrence and discussed the surveillance plan. He will be due repeat colonoscopy per Dr. Havery Moros in 01/2020. He will return for lab and f/u with Dr. Burr Medico in 6 months. I encouraged him to continue f/u with PCP and GI, to eat well and increase po hydration, and to exercise. He does not smoke. He knows to call clinic to seek in-person evaluation for any new concerns.   All questions were answered. The patient knows to call the clinic with any problems, questions or concerns. No barriers to learning was detected. I spent 10  minutes non-face-to-face time in today's encounter.     Alla Feeling, NP 03/05/19

## 2019-09-04 ENCOUNTER — Telehealth: Payer: Self-pay | Admitting: Hematology

## 2019-09-04 NOTE — Telephone Encounter (Signed)
Returned patient's phone call regarding rescheduling an appointment, left a voicemail. 

## 2019-09-05 ENCOUNTER — Inpatient Hospital Stay: Payer: BC Managed Care – PPO

## 2019-09-05 ENCOUNTER — Other Ambulatory Visit: Payer: Self-pay

## 2019-09-05 ENCOUNTER — Inpatient Hospital Stay: Payer: BC Managed Care – PPO | Admitting: Hematology

## 2019-09-18 NOTE — Progress Notes (Signed)
[place]   Telephone:(336) [phone] Fax:(336) [phone]   Clinic Follow up Note   Patient Care Team: Leone Haven, MD as PCP - General (Family Medicine) Michael Boston, MD as Consulting Physician (General Surgery) Armbruster, Carlota Raspberry, MD as Consulting Physician (Gastroenterology)  Date of Service:  09/20/2019  CHIEF COMPLAINT: f/u for colon cancer  SUMMARY OF ONCOLOGIC HISTORY: Oncology History Overview Note  Noted on screening colonoscopy  Cancer of ascending colon s/p robotic colectomy 01/29/2016   Staging form: Colon and Rectum, AJCC 7th Edition     Pathologic stage from 01/19/2016: Stage IIA (T3, N0, cM0) - Signed by Truitt Merle, MD on 02/16/2016     Cancer of ascending colon s/p robotic colectomy 01/29/2016  01/07/2016 Procedure   COLONOSCOPY: large polypoid lesion in mid ascending colon -sessile and behind a fold and several polyps per Dr. Havery Moros   01/12/2016 Imaging   CT C/A/P: Colon wall thickening-ascending and right mesenteric node; small indeterminat liver lesions; #2 pulmonary nodules   01/15/2016 Imaging   MRI Liver: Liver cysts and a hemangioma   01/19/2016 Surgery   Robot assist proximal right colectomy per Dr. Johney Maine   01/29/2016 Initial Diagnosis   Cancer of ascending colon s/p robotic colectomy 01/29/2016   01/29/2016 Pathologic Stage   pT3, pN0, PMX--Grade 2   01/29/2016 Pathology Results   Adenocarcinoma, IHC normal; MSI stable   01/27/2017 Imaging   CT AP W Contrast 01/27/17 IMPRESSION: Interval right colectomy. No evidence of definitive recurrence or metastatic process.   02/27/2017 Procedure   COLONOSCOPY per Dr. Havery Moros - The perianal and digital rectal examinations were normal. - There was evidence of a prior end-to-end ileo-colonic anastomosis in the ascending colon. This was patent and was characterized by healthy appearing mucosa. - A diminutive polyp was found in the anastomosis. The polyp was sessile, I suspect reactive  / inflammatory polyp. The polyp was removed with a cold biopsy forceps. Resection and retrieval were complete. - The exam was otherwise without abnormality on direct and retroflexion views.   02/27/2017 Pathology Results   COLONOSCOPY per Dr. Havery Moros Surgical [P], ascending, polyp - TUBULAR ADENOMA. - NO HIGH GRADE DYSPLASIA OR MALIGNANCY.   02/27/2017 Pathology Results   02/27/2017 colonoscopy and pathology Diagnosis Surgical [P], ascending, polyp - TUBULAR ADENOMA. - NO HIGH GRADE DYSPLASIA OR MALIGNANCY   02/16/2018 Imaging   CT CAP W Contrast 02/16/18  IMPRESSION: 1. No findings to suggest metastatic disease in the chest, abdomen or pelvis. 2. Multiple stable low-attenuation liver lesions. The largest of these are compatible with simple cysts. The smaller lesions are too small to definitively characterize, but given their stability these are presumably benign lesions such as small hepatic cysts and/or biliary hamartomas. 3. Mild aortic atherosclerosis.  Aortic Atherosclerosis (ICD10-I70.0).    02/18/2019 Imaging   CT AP W Contrast  IMPRESSION: Stable exam. No evidence of recurrent or metastatic carcinoma within the abdomen or pelvis.      CURRENT THERAPY:  Surveillance  INTERVAL HISTORY:  Arthur West is here for a follow up of colon cancer. He was last seen by me over 1 year ago and seen by NP Lacie 6 months ago in interim. He presents to the clinic alone. He notes he has gained weight as he is mostly home. He does have adequate appetite. He denies irregular BM, fever, blood in stool, chest discomfort or SOB. He notes he has never smoked. He is not currently on medications. He has had elevated BP in clinic  the last few visits. He denies HA's. He has not seen his PCP in a while.     REVIEW OF SYSTEMS:   Constitutional: Denies fevers, chills or abnormal weight loss Eyes: Denies blurriness of vision Ears, nose, mouth, throat, and face: Denies mucositis or sore  throat Respiratory: Denies cough, dyspnea or wheezes Cardiovascular: Denies palpitation, chest discomfort or lower extremity swelling Gastrointestinal:  Denies nausea, heartburn or change in bowel habits Skin: Denies abnormal skin rashes Lymphatics: Denies new lymphadenopathy or easy bruising Neurological:Denies numbness, tingling or new weaknesses Behavioral/Psych: Mood is stable, no new changes  All other systems were reviewed with the patient and are negative.  MEDICAL HISTORY:  Past Medical History:  Diagnosis Date  . Allergy   . Arthritis    knees  . Cancer ([place])    colon  . Headache    sinus headaches  . Sinus congestion    with pollen     SURGICAL HISTORY: Past Surgical History:  Procedure Laterality Date  . EYE SURGERY     as child  . LAPAROSCOPIC RIGHT COLECTOMY      I have reviewed the social history and family history with the patient and they are unchanged from previous note.  ALLERGIES:  is allergic to hydrocodone and oxycodone.  MEDICATIONS:  Current Outpatient Medications  Medication Sig Dispense Refill  . Multiple Vitamin (MULTIVITAMIN WITH MINERALS) TABS tablet Take 1 tablet by mouth daily.     No current facility-administered medications for this visit.    PHYSICAL EXAMINATION: ECOG PERFORMANCE STATUS: 0 - Asymptomatic  Vitals:   09/20/19 1450 09/20/19 1453  BP: (!) 154/114 (!) 150/108  Pulse: 76   Resp: 20   Temp: 98.2 F (36.8 C)   SpO2: 98%    Filed Weights   09/20/19 1450  Weight: 257 lb 14.4 oz (117 kg)    GENERAL:alert, no distress and comfortable SKIN: skin color, texture, turgor are normal, no rashes or significant lesions EYES: normal, Conjunctiva are pink and non-injected, sclera clear  NECK: supple, thyroid normal size, non-tender, without nodularity LYMPH:  no palpable lymphadenopathy in the cervical, axillary  LUNGS: clear to auscultation and percussion with normal breathing effort HEART: regular rate & rhythm and no  murmurs and no lower extremity edema ABDOMEN:abdomen soft, non-tender and normal bowel sounds. No hepatomegaly.  Musculoskeletal:no cyanosis of digits and no clubbing  NEURO: alert & oriented x 3 with fluent speech, no focal motor/sensory deficits  LABORATORY DATA:  I have reviewed the data as listed CBC Latest Ref Rng & Units 09/20/2019 02/18/2019 08/24/2018  WBC 4.0 - 10.5 K/uL 6.2 5.7 5.5  Hemoglobin 13.0 - 17.0 g/dL 16.7 15.5 15.2  Hematocrit 39.0 - 52.0 % 52.1(H) 48.2 47.3  Platelets 150 - 400 K/uL 201 191 184     CMP Latest Ref Rng & Units 09/20/2019 02/18/2019 08/24/2018  Glucose 70 - 99 mg/dL 97 98 97  BUN 6 - 20 mg/dL '16 19 19  ' Creatinine 0.61 - 1.24 mg/dL 1.07 1.27(H) 1.29(H)  Sodium 135 - 145 mmol/L 140 143 144  Potassium 3.5 - 5.1 mmol/L 4.0 4.1 4.5  Chloride 98 - 111 mmol/L 105 107 107  CO2 22 - 32 mmol/L '26 26 28  ' Calcium 8.9 - 10.3 mg/dL 9.5 9.6 9.4  Total Protein 6.5 - 8.1 g/dL 7.8 7.6 7.4  Total Bilirubin 0.3 - 1.2 mg/dL 0.5 0.7 0.7  Alkaline Phos 38 - 126 U/L 75 65 72  AST 15 - 41 U/L 20 24 20  ALT 0 - 44 U/L '26 28 19      ' RADIOGRAPHIC STUDIES: I have personally reviewed the radiological images as listed and agreed with the findings in the report. No results found.   ASSESSMENT & PLAN:  Arthur West is a 59 y.o. male with    1. Cancer of ascending colon, invasive adenocarcinoma, grade 2, pT3N0M0, stage IIA, MSI-stable -He was diagnosed on 12/2015. He is s/p right colectomy.  -He had early-stage disease, node negative, no high risk features. His risk of recurrence is low to moderate. Adjuvant chemotherapy was not recommended -He is clinically doing well and stable. Lab reviewed, her CBC and CMP are within normal limits. His physical exam unremarkable. There is no clinical concern for recurrence. -He is over 3 years since diagnosis. His risk of recurrence has significantly decreased. I do not plan for more surveillance scan unless he has concerning symptoms.  Will continue 5 years surveillance.  -Lab and f/u in 1 year. I encouraged him to f/u with PCP in interim to manage overall health.    2. Obesity  -I again encouraged him to eat healthy, exercise regularly, and try to lose some weight.  He is very physical activity at work. He agrees.   3. Hypertension  -He has had elevated BP in clinic in the last few visits. -Today repeated BP show 154/114 and 150/108 on each arms (09/20/19).  -I advised him to monitor BP at  home see his PCP next week for further evaluation and management as this can lead to chronic health issues. He understands and agrees.   Plan -He is clinically doing well.  -I personally called his PCP Dr. Caryl Bis and spoke with his RN, she will arrange his f/u appointment with them early next week  No problem-specific Assessment & Plan notes found for this encounter.   No orders of the defined types were placed in this encounter.  All questions were answered. The patient knows to call the clinic with any problems, questions or concerns. No barriers to learning was detected. The total time spent in the appointment was 20 minutes.     Truitt Merle, MD 09/20/2019   I, Joslyn Devon, am acting as scribe for Truitt Merle, MD.   I have reviewed the above documentation for accuracy and completeness, and I agree with the above.

## 2019-09-20 ENCOUNTER — Telehealth: Payer: Self-pay | Admitting: Hematology

## 2019-09-20 ENCOUNTER — Telehealth: Payer: Self-pay

## 2019-09-20 ENCOUNTER — Encounter: Payer: Self-pay | Admitting: Hematology

## 2019-09-20 ENCOUNTER — Inpatient Hospital Stay (HOSPITAL_BASED_OUTPATIENT_CLINIC_OR_DEPARTMENT_OTHER): Payer: BC Managed Care – PPO | Admitting: Hematology

## 2019-09-20 ENCOUNTER — Inpatient Hospital Stay: Payer: BC Managed Care – PPO | Attending: Hematology

## 2019-09-20 ENCOUNTER — Other Ambulatory Visit: Payer: Self-pay

## 2019-09-20 VITALS — BP 150/108 | HR 76 | Temp 98.2°F | Resp 20 | Wt 257.9 lb

## 2019-09-20 DIAGNOSIS — E669 Obesity, unspecified: Secondary | ICD-10-CM | POA: Diagnosis not present

## 2019-09-20 DIAGNOSIS — I700 Atherosclerosis of aorta: Secondary | ICD-10-CM | POA: Insufficient documentation

## 2019-09-20 DIAGNOSIS — C182 Malignant neoplasm of ascending colon: Secondary | ICD-10-CM | POA: Diagnosis not present

## 2019-09-20 DIAGNOSIS — I10 Essential (primary) hypertension: Secondary | ICD-10-CM | POA: Insufficient documentation

## 2019-09-20 LAB — CBC WITH DIFFERENTIAL/PLATELET
Abs Immature Granulocytes: 0.01 10*3/uL (ref 0.00–0.07)
Basophils Absolute: 0 10*3/uL (ref 0.0–0.1)
Basophils Relative: 1 %
Eosinophils Absolute: 0.2 10*3/uL (ref 0.0–0.5)
Eosinophils Relative: 4 %
HCT: 52.1 % — ABNORMAL HIGH (ref 39.0–52.0)
Hemoglobin: 16.7 g/dL (ref 13.0–17.0)
Immature Granulocytes: 0 %
Lymphocytes Relative: 31 %
Lymphs Abs: 1.9 10*3/uL (ref 0.7–4.0)
MCH: 26 pg (ref 26.0–34.0)
MCHC: 32.1 g/dL (ref 30.0–36.0)
MCV: 81 fL (ref 80.0–100.0)
Monocytes Absolute: 0.5 10*3/uL (ref 0.1–1.0)
Monocytes Relative: 8 %
Neutro Abs: 3.6 10*3/uL (ref 1.7–7.7)
Neutrophils Relative %: 56 %
Platelets: 201 10*3/uL (ref 150–400)
RBC: 6.43 MIL/uL — ABNORMAL HIGH (ref 4.22–5.81)
RDW: 13.4 % (ref 11.5–15.5)
WBC: 6.2 10*3/uL (ref 4.0–10.5)
nRBC: 0 % (ref 0.0–0.2)

## 2019-09-20 LAB — COMPREHENSIVE METABOLIC PANEL
ALT: 26 U/L (ref 0–44)
AST: 20 U/L (ref 15–41)
Albumin: 4.2 g/dL (ref 3.5–5.0)
Alkaline Phosphatase: 75 U/L (ref 38–126)
Anion gap: 9 (ref 5–15)
BUN: 16 mg/dL (ref 6–20)
CO2: 26 mmol/L (ref 22–32)
Calcium: 9.5 mg/dL (ref 8.9–10.3)
Chloride: 105 mmol/L (ref 98–111)
Creatinine, Ser: 1.07 mg/dL (ref 0.61–1.24)
GFR calc Af Amer: >60 mL/min (ref >60)
GFR calc non Af Amer: >60 mL/min (ref >60)
Glucose, Bld: 97 mg/dL (ref 70–99)
Potassium: 4 mmol/L (ref 3.5–5.1)
Sodium: 140 mmol/L (ref 135–145)
Total Bilirubin: 0.5 mg/dL (ref 0.3–1.2)
Total Protein: 7.8 g/dL (ref 6.5–8.1)

## 2019-09-20 NOTE — Telephone Encounter (Signed)
Scheduled appt per 2/19 los.  Sent a message to HIM pool to get a calendar mailed out.

## 2019-09-20 NOTE — Telephone Encounter (Signed)
I received a call from the patient's cancer doctor and she stated the patient needed to be seen about his elevated BP, I called the patient and left a  Message to call and schedule a visit.  Tannah Dreyfuss,cma

## 2019-09-23 LAB — CEA (IN HOUSE-CHCC): CEA (CHCC-In House): 1.39 ng/mL (ref 0.00–5.00)

## 2019-10-01 ENCOUNTER — Ambulatory Visit: Payer: BC Managed Care – PPO | Admitting: Family Medicine

## 2019-11-08 ENCOUNTER — Ambulatory Visit: Payer: BC Managed Care – PPO | Admitting: Family Medicine

## 2019-12-27 ENCOUNTER — Encounter: Payer: Self-pay | Admitting: Gastroenterology

## 2020-03-03 ENCOUNTER — Encounter: Payer: Self-pay | Admitting: Gastroenterology

## 2020-03-10 ENCOUNTER — Other Ambulatory Visit: Payer: Self-pay

## 2020-03-11 ENCOUNTER — Ambulatory Visit (INDEPENDENT_AMBULATORY_CARE_PROVIDER_SITE_OTHER): Payer: BC Managed Care – PPO | Admitting: Internal Medicine

## 2020-03-11 ENCOUNTER — Ambulatory Visit (INDEPENDENT_AMBULATORY_CARE_PROVIDER_SITE_OTHER): Payer: BC Managed Care – PPO

## 2020-03-11 ENCOUNTER — Encounter: Payer: Self-pay | Admitting: Internal Medicine

## 2020-03-11 VITALS — BP 124/84 | HR 75 | Temp 97.9°F | Ht 72.0 in | Wt 254.8 lb

## 2020-03-11 DIAGNOSIS — M79601 Pain in right arm: Secondary | ICD-10-CM

## 2020-03-11 DIAGNOSIS — M549 Dorsalgia, unspecified: Secondary | ICD-10-CM

## 2020-03-11 DIAGNOSIS — M542 Cervicalgia: Secondary | ICD-10-CM

## 2020-03-11 DIAGNOSIS — M25511 Pain in right shoulder: Secondary | ICD-10-CM

## 2020-03-11 DIAGNOSIS — R030 Elevated blood-pressure reading, without diagnosis of hypertension: Secondary | ICD-10-CM

## 2020-03-11 DIAGNOSIS — Z85038 Personal history of other malignant neoplasm of large intestine: Secondary | ICD-10-CM

## 2020-03-11 MED ORDER — METHYLPREDNISOLONE ACETATE 80 MG/ML IJ SUSP
80.0000 mg | Freq: Once | INTRAMUSCULAR | Status: AC
  Administered 2020-03-11: 80 mg via INTRAMUSCULAR

## 2020-03-11 MED ORDER — OXYCODONE-ACETAMINOPHEN 5-325 MG PO TABS: 1.0000 | ORAL_TABLET | Freq: Two times a day (BID) | ORAL | 0 refills | Status: DC | PRN

## 2020-03-11 NOTE — Progress Notes (Signed)
Patient presenting with right elbow radiating up the back of the arm, into the right shoulder, and down into the right sided mid back. No pain on the left side. Pain is rated 8/10.  No known injuries, falls, or history of pain. Pain spike when moving a certain way.

## 2020-03-11 NOTE — Patient Instructions (Addendum)
Goal blood pressure <130/<80   DASH Eating Plan DASH stands for "Dietary Approaches to Stop Hypertension." The DASH eating plan is a healthy eating plan that has been shown to reduce high blood pressure (hypertension). It may also reduce your risk for type 2 diabetes, heart disease, and stroke. The DASH eating plan may also help with weight loss. What are tips for following this plan?  General guidelines  Avoid eating more than 2,300 mg (milligrams) of salt (sodium) a day. If you have hypertension, you may need to reduce your sodium intake to 1,500 mg a day.  Limit alcohol intake to no more than 1 drink a day for nonpregnant women and 2 drinks a day for men. One drink equals 12 oz of beer, 5 oz of wine, or 1 oz of hard liquor.  Work with your health care provider to maintain a healthy body weight or to lose weight. Ask what an ideal weight is for you.  Get at least 30 minutes of exercise that causes your heart to beat faster (aerobic exercise) most days of the week. Activities may include walking, swimming, or biking.  Work with your health care provider or diet and nutrition specialist (dietitian) to adjust your eating plan to your individual calorie needs. Reading food labels   Check food labels for the amount of sodium per serving. Choose foods with less than 5 percent of the Daily Value of sodium. Generally, foods with less than 300 mg of sodium per serving fit into this eating plan.  To find whole grains, look for the word "whole" as the first word in the ingredient list. Shopping  Buy products labeled as "low-sodium" or "no salt added."  Buy fresh foods. Avoid canned foods and premade or frozen meals. Cooking  Avoid adding salt when cooking. Use salt-free seasonings or herbs instead of table salt or sea salt. Check with your health care provider or pharmacist before using salt substitutes.  Do not fry foods. Cook foods using healthy methods such as baking, boiling, grilling,  and broiling instead.  Cook with heart-healthy oils, such as olive, canola, soybean, or sunflower oil. Meal planning  Eat a balanced diet that includes: ? 5 or more servings of fruits and vegetables each day. At each meal, try to fill half of your plate with fruits and vegetables. ? Up to 6-8 servings of whole grains each day. ? Less than 6 oz of lean meat, poultry, or fish each day. A 3-oz serving of meat is about the same size as a deck of cards. One egg equals 1 oz. ? 2 servings of low-fat dairy each day. ? A serving of nuts, seeds, or beans 5 times each week. ? Heart-healthy fats. Healthy fats called Omega-3 fatty acids are found in foods such as flaxseeds and coldwater fish, like sardines, salmon, and mackerel.  Limit how much you eat of the following: ? Canned or prepackaged foods. ? Food that is high in trans fat, such as fried foods. ? Food that is high in saturated fat, such as fatty meat. ? Sweets, desserts, sugary drinks, and other foods with added sugar. ? Full-fat dairy products.  Do not salt foods before eating.  Try to eat at least 2 vegetarian meals each week.  Eat more home-cooked food and less restaurant, buffet, and fast food.  When eating at a restaurant, ask that your food be prepared with less salt or no salt, if possible. What foods are recommended? The items listed may not be a complete  list. Talk with your dietitian about what dietary choices are best for you. Grains Whole-grain or whole-wheat bread. Whole-grain or whole-wheat pasta. Brown rice. [place]. Bulgur. Whole-grain and low-sodium cereals. Pita bread. Low-fat, low-sodium crackers. Whole-wheat flour tortillas. Vegetables Fresh or frozen vegetables (raw, steamed, roasted, or grilled). Low-sodium or reduced-sodium tomato and vegetable juice. Low-sodium or reduced-sodium tomato sauce and tomato paste. Low-sodium or reduced-sodium canned vegetables. Fruits All fresh, dried, or frozen fruit. Canned  fruit in natural juice (without added sugar). Meat and other protein foods Skinless chicken or [place]. Ground chicken or [place]. Pork with fat trimmed off. Fish and seafood. Egg whites. Dried beans, peas, or lentils. Unsalted nuts, nut butters, and seeds. Unsalted canned beans. Lean cuts of beef with fat trimmed off. Low-sodium, lean deli meat. Dairy Low-fat (1%) or fat-free (skim) milk. Fat-free, low-fat, or reduced-fat cheeses. Nonfat, low-sodium ricotta or cottage cheese. Low-fat or nonfat yogurt. Low-fat, low-sodium cheese. Fats and oils Soft margarine without trans fats. Vegetable oil. Low-fat, reduced-fat, or light mayonnaise and salad dressings (reduced-sodium). Canola, safflower, olive, soybean, and sunflower oils. Avocado. Seasoning and other foods Herbs. Spices. Seasoning mixes without salt. Unsalted popcorn and pretzels. Fat-free sweets. What foods are not recommended? The items listed may not be a complete list. Talk with your dietitian about what dietary choices are best for you. Grains Baked goods made with fat, such as croissants, muffins, or some breads. Dry pasta or rice meal packs. Vegetables Creamed or fried vegetables. Vegetables in a cheese sauce. Regular canned vegetables (not low-sodium or reduced-sodium). Regular canned tomato sauce and paste (not low-sodium or reduced-sodium). Regular tomato and vegetable juice (not low-sodium or reduced-sodium). Angie Fava. Olives. Fruits Canned fruit in a light or heavy syrup. Fried fruit. Fruit in cream or butter sauce. Meat and other protein foods Fatty cuts of meat. Ribs. Fried meat. Berniece Salines. Sausage. Bologna and other processed lunch meats. Salami. Fatback. Hotdogs. Bratwurst. Salted nuts and seeds. Canned beans with added salt. Canned or smoked fish. Whole eggs or egg yolks. Chicken or [place] with skin. Dairy Whole or 2% milk, cream, and half-and-half. Whole or full-fat cream cheese. Whole-fat or sweetened yogurt. Full-fat cheese.  Nondairy creamers. Whipped toppings. Processed cheese and cheese spreads. Fats and oils Butter. Stick margarine. Lard. Shortening. Ghee. Bacon fat. Tropical oils, such as coconut, palm kernel, or palm oil. Seasoning and other foods Salted popcorn and pretzels. Onion salt, garlic salt, seasoned salt, table salt, and sea salt. Worcestershire sauce. Tartar sauce. Barbecue sauce. Teriyaki sauce. Soy sauce, including reduced-sodium. Steak sauce. Canned and packaged gravies. Fish sauce. Oyster sauce. Cocktail sauce. Horseradish that you find on the shelf. Ketchup. Mustard. Meat flavorings and tenderizers. Bouillon cubes. Hot sauce and Tabasco sauce. Premade or packaged marinades. Premade or packaged taco seasonings. Relishes. Regular salad dressings. Where to find more information:  National Heart, Lung, and [place]: https://wilson-eaton.com/  American Heart Association: www.heart.org Summary  The DASH eating plan is a healthy eating plan that has been shown to reduce high blood pressure (hypertension). It may also reduce your risk for type 2 diabetes, heart disease, and stroke.  With the DASH eating plan, you should limit salt (sodium) intake to 2,300 mg a day. If you have hypertension, you may need to reduce your sodium intake to 1,500 mg a day.  When on the DASH eating plan, aim to eat more fresh fruits and vegetables, whole grains, lean proteins, low-fat dairy, and heart-healthy fats.  Work with your health care provider or diet and nutrition specialist (dietitian) to  adjust your eating plan to your individual calorie needs. This information is not intended to replace advice given to you by your health care provider. Make sure you discuss any questions you have with your health care provider. Document Revised: 06/30/2017 Document Reviewed: 07/11/2016 Elsevier Patient Education  [place].  Shoulder Range of Motion Exercises Shoulder range of motion (ROM) exercises are done to keep the  shoulder moving freely or to increase movement. They are often recommended for people who have shoulder pain or stiffness or who are recovering from a shoulder surgery. Phase 1 exercises When you are able, do this exercise 1-2 times per day for 30-60 seconds in each direction, or as directed by your health care provider. Pendulum exercise To do this exercise while sitting: 1. Sit in a chair or at the edge of your bed with your feet flat on the floor. 2. Let your affected arm hang down in front of you over the edge of the bed or chair. 3. Relax your shoulder, arm, and hand. [place] your body so your arm gently swings in small circles. You can also use your unaffected arm to start the motion. 5. Repeat changing the direction of the circles, swinging your arm left and right, and swinging your arm forward and back. To do this exercise while standing: 1. Stand next to a sturdy chair or table, and hold on to it with your hand on your unaffected side. 2. Bend forward at the waist. 3. Bend your knees slightly. 4. Relax your shoulder, arm, and hand. 5. While keeping your shoulder relaxed, use body motion to swing your arm in small circles. 6. Repeat changing the direction of the circles, swinging your arm left and right, and swinging your arm forward and back. 7. Between exercises, stand up tall and take a short break to relax your lower back.  Phase 2 exercises Do these exercises 1-2 times per day or as told by your health care provider. Hold each stretch for 30 seconds, and repeat 3 times. Do the exercises with one or both arms as instructed by your health care provider. For these exercises, sit at a table with your hand and arm supported by the table. A chair that slides easily or has wheels can be helpful. External rotation 1. Turn your chair so that your affected side is nearest to the table. 2. Place your forearm on the table to your side. Bend your elbow about 90 at the elbow (right angle) and  place your hand palm facing down on the table. Your elbow should be about 6 inches away from your side. 3. Keeping your arm on the table, lean your body forward. Abduction 1. Turn your chair so that your affected side is nearest to the table. 2. Place your forearm and hand on the table so that your thumb points toward the ceiling and your arm is straight out to your side. 3. Slide your hand out to the side and away from you, using your unaffected arm to do the work. 4. To increase the stretch, you can slide your chair away from the table. Flexion: forward stretch 1. Sit facing the table. Place your hand and elbow on the table in front of you. 2. Slide your hand forward and away from you, using your unaffected arm to do the work. 3. To increase the stretch, you can slide your chair backward. Phase 3 exercises Do these exercises 1-2 times per day or as told by your health care provider.  Hold each stretch for 30 seconds, and repeat 3 times. Do the exercises with one or both arms as instructed by your health care provider. Cross-body stretch: posterior capsule stretch 1. Lift your arm straight out in front of you. 2. Bend your arm 90 at the elbow (right angle) so your forearm moves across your body. 3. Use your other arm to gently pull the elbow across your body, toward your other shoulder. Wall climbs 1. Stand with your affected arm extended out to the side with your hand resting on a door frame. 2. Slide your hand slowly up the door frame. 3. To increase the stretch, step through the door frame. Keep your body upright and do not lean. Wand exercises You will need a cane, a piece of PVC pipe, or a sturdy wooden dowel for wand exercises. Flexion To do this exercise while standing: 1. Hold the wand with both of your hands, palms down. 2. Using the other arm to help, lift your arms up and over your head, if able. 3. Push upward with your other arm to gently increase the stretch. To do this  exercise while lying down: 1. Lie on your back with your elbows resting on the floor and the wand in both your hands. Your hands will be palm down, or pointing toward your feet. 2. Lift your hands toward the ceiling, using your unaffected arm to help if needed. 3. Bring your arms overhead as able, using your unaffected arm to help if needed. Internal rotation 1. Stand while holding the wand behind you with both hands. Your unaffected arm should be extended above your head with the arm of the affected side extended behind you at the level of your waist. The wand should be pointing straight up and down as you hold it. 2. Slowly pull the wand up behind your back by straightening the elbow of your unaffected arm and bending the elbow of your affected arm. External rotation 1. Lie on your back with your affected upper arm supported on a small pillow or rolled towel. When you first do this exercise, keep your upper arm close to your body. Over time, bring your arm up to a 90 angle out to the side. 2. Hold the wand across your stomach and with both hands palm up. Your elbow on your affected side should be bent at a 90 angle. 3. Use your unaffected side to help push your forearm away from you and toward the floor. Keep your elbow on your affected side bent at a 90 angle. Contact a health care provider if you have:  New or increasing pain.  New numbness, tingling, weakness, or discoloration in your arm or hand. This information is not intended to replace advice given to you by your health care provider. Make sure you discuss any questions you have with your health care provider. Document Revised: 08/30/2017 Document Reviewed: 08/30/2017 Elsevier Patient Education  [place].  Shoulder Exercises Ask your health care provider which exercises are safe for you. Do exercises exactly as told by your health care provider and adjust them as directed. It is normal to feel mild stretching, pulling,  tightness, or discomfort as you do these exercises. Stop right away if you feel sudden pain or your pain gets worse. Do not begin these exercises until told by your health care provider. Stretching exercises External rotation and abduction This exercise is sometimes called corner stretch. This exercise rotates your arm outward (external rotation) and moves your arm out  from your body (abduction). 1. Stand in a doorway with one of your feet slightly in front of the other. This is called a staggered stance. If you cannot reach your forearms to the door frame, stand facing a corner of a room. 2. Choose one of the following positions as told by your health care provider: ? Place your hands and forearms on the door frame above your head. ? Place your hands and forearms on the door frame at the height of your head. ? Place your hands on the door frame at the height of your elbows. 3. Slowly move your weight onto your front foot until you feel a stretch across your chest and in the front of your shoulders. Keep your head and chest upright and keep your abdominal muscles tight. 4. Hold for __________ seconds. 5. To release the stretch, shift your weight to your back foot. Repeat __________ times. Complete this exercise __________ times a day. Extension, standing 1. Stand and hold a broomstick, a cane, or a similar object behind your back. ? Your hands should be a little wider than shoulder width apart. ? Your palms should face away from your back. 2. Keeping your elbows straight and your shoulder muscles relaxed, move the stick away from your body until you feel a stretch in your shoulders (extension). ? Avoid shrugging your shoulders while you move the stick. Keep your shoulder blades tucked down toward the middle of your back. 3. Hold for __________ seconds. 4. Slowly return to the starting position. Repeat __________ times. Complete this exercise __________ times a day. Range-of-motion  exercises Pendulum  1. Stand near a wall or a surface that you can hold onto for balance. 2. Bend at the waist and let your left / right arm hang straight down. Use your other arm to support you. Keep your back straight and do not lock your knees. 3. Relax your left / right arm and shoulder muscles, and move your hips and your trunk so your left / right arm swings freely. Your arm should swing because of the motion of your body, not because you are using your arm or shoulder muscles. 4. Keep moving your hips and trunk so your arm swings in the following directions, as told by your health care provider: ? Side to side. ? Forward and backward. ? In clockwise and counterclockwise circles. 5. Continue each motion for __________ seconds, or for as long as told by your health care provider. 6. Slowly return to the starting position. Repeat __________ times. Complete this exercise __________ times a day. Shoulder flexion, standing  1. Stand and hold a broomstick, a cane, or a similar object. Place your hands a little more than shoulder width apart on the object. Your left / right hand should be palm up, and your other hand should be palm down. 2. Keep your elbow straight and your shoulder muscles relaxed. Push the stick up with your healthy arm to raise your left / right arm in front of your body, and then over your head until you feel a stretch in your shoulder (flexion). ? Avoid shrugging your shoulder while you raise your arm. Keep your shoulder blade tucked down toward the middle of your back. 3. Hold for __________ seconds. 4. Slowly return to the starting position. Repeat __________ times. Complete this exercise __________ times a day. Shoulder abduction, standing 1. Stand and hold a broomstick, a cane, or a similar object. Place your hands a little more than shoulder width apart on  the object. Your left / right hand should be palm up, and your other hand should be palm down. 2. Keep your elbow  straight and your shoulder muscles relaxed. Push the object across your body toward your left / right side. Raise your left / right arm to the side of your body (abduction) until you feel a stretch in your shoulder. ? Do not raise your arm above shoulder height unless your health care provider tells you to do that. ? If directed, raise your arm over your head. ? Avoid shrugging your shoulder while you raise your arm. Keep your shoulder blade tucked down toward the middle of your back. 3. Hold for __________ seconds. 4. Slowly return to the starting position. Repeat __________ times. Complete this exercise __________ times a day. Internal rotation  1. Place your left / right hand behind your back, palm up. 2. Use your other hand to dangle an exercise band, a towel, or a similar object over your shoulder. Grasp the band with your left / right hand so you are holding on to both ends. 3. Gently pull up on the band until you feel a stretch in the front of your left / right shoulder. The movement of your arm toward the center of your body is called internal rotation. ? Avoid shrugging your shoulder while you raise your arm. Keep your shoulder blade tucked down toward the middle of your back. 4. Hold for __________ seconds. 5. Release the stretch by letting go of the band and lowering your hands. Repeat __________ times. Complete this exercise __________ times a day. Strengthening exercises External rotation  1. Sit in a stable chair without armrests. 2. Secure an exercise band to a stable object at elbow height on your left / right side. 3. Place a soft object, such as a folded towel or a small pillow, between your left / right upper arm and your body to move your elbow about 4 inches (10 cm) away from your side. 4. Hold the end of the exercise band so it is tight and there is no slack. 5. Keeping your elbow pressed against the soft object, slowly move your forearm out, away from your abdomen  (external rotation). Keep your body steady so only your forearm moves. 6. Hold for __________ seconds. 7. Slowly return to the starting position. Repeat __________ times. Complete this exercise __________ times a day. Shoulder abduction  1. Sit in a stable chair without armrests, or stand up. 2. Hold a __________ weight in your left / right hand, or hold an exercise band with both hands. 3. Start with your arms straight down and your left / right palm facing in, toward your body. 4. Slowly lift your left / right hand out to your side (abduction). Do not lift your hand above shoulder height unless your health care provider tells you that this is safe. ? Keep your arms straight. ? Avoid shrugging your shoulder while you do this movement. Keep your shoulder blade tucked down toward the middle of your back. 5. Hold for __________ seconds. 6. Slowly lower your arm, and return to the starting position. Repeat __________ times. Complete this exercise __________ times a day. Shoulder extension 1. Sit in a stable chair without armrests, or stand up. 2. Secure an exercise band to a stable object in front of you so it is at shoulder height. 3. Hold one end of the exercise band in each hand. Your palms should face each other. 4. Straighten your elbows and  lift your hands up to shoulder height. 5. Step back, away from the secured end of the exercise band, until the band is tight and there is no slack. 6. Squeeze your shoulder blades together as you pull your hands down to the sides of your thighs (extension). Stop when your hands are straight down by your sides. Do not let your hands go behind your body. 7. Hold for __________ seconds. 8. Slowly return to the starting position. Repeat __________ times. Complete this exercise __________ times a day. Shoulder row 1. Sit in a stable chair without armrests, or stand up. 2. Secure an exercise band to a stable object in front of you so it is at waist  height. 3. Hold one end of the exercise band in each hand. Position your palms so that your thumbs are facing the ceiling (neutral position). 4. Bend each of your elbows to a 90-degree angle (right angle) and keep your upper arms at your sides. 5. Step back until the band is tight and there is no slack. 6. Slowly pull your elbows back behind you. 7. Hold for __________ seconds. 8. Slowly return to the starting position. Repeat __________ times. Complete this exercise __________ times a day. Shoulder press-ups  1. Sit in a stable chair that has armrests. Sit upright, with your feet flat on the floor. 2. Put your hands on the armrests so your elbows are bent and your fingers are pointing forward. Your hands should be about even with the sides of your body. 3. Push down on the armrests and use your arms to lift yourself off the chair. Straighten your elbows and lift yourself up as much as you comfortably can. ? Move your shoulder blades down, and avoid letting your shoulders move up toward your ears. ? Keep your feet on the ground. As you get stronger, your feet should support less of your body weight as you lift yourself up. 4. Hold for __________ seconds. 5. Slowly lower yourself back into the chair. Repeat __________ times. Complete this exercise __________ times a day. Wall push-ups  1. Stand so you are facing a stable wall. Your feet should be about one arm-length away from the wall. 2. Lean forward and place your palms on the wall at shoulder height. 3. Keep your feet flat on the floor as you bend your elbows and lean forward toward the wall. 4. Hold for __________ seconds. 5. Straighten your elbows to push yourself back to the starting position. Repeat __________ times. Complete this exercise __________ times a day. This information is not intended to replace advice given to you by your health care provider. Make sure you discuss any questions you have with your health care  provider. Document Revised: 11/09/2018 Document Reviewed: 08/17/2018 Elsevier Patient Education  [place].  Neck Exercises Ask your health care provider which exercises are safe for you. Do exercises exactly as told by your health care provider and adjust them as directed. It is normal to feel mild stretching, pulling, tightness, or discomfort as you do these exercises. Stop right away if you feel sudden pain or your pain gets worse. Do not begin these exercises until told by your health care provider. Neck exercises can be important for many reasons. They can improve strength and maintain flexibility in your neck, which will help your upper back and prevent neck pain. Stretching exercises Rotation neck stretching  1. Sit in a chair or stand up. 2. Place your feet flat on the floor, shoulder width  apart. 3. Slowly turn your head (rotate) to the right until a slight stretch is felt. Turn it all the way to the right so you can look over your right shoulder. Do not tilt or tip your head. 4. Hold this position for 10-30 seconds. 5. Slowly turn your head (rotate) to the left until a slight stretch is felt. Turn it all the way to the left so you can look over your left shoulder. Do not tilt or tip your head. 6. Hold this position for 10-30 seconds. Repeat __________ times. Complete this exercise __________ times a day. Neck retraction 1. Sit in a sturdy chair or stand up. 2. Look straight ahead. Do not bend your neck. 3. Use your fingers to push your chin backward (retraction). Do not bend your neck for this movement. Continue to face straight ahead. If you are doing the exercise properly, you will feel a slight sensation in your throat and a stretch at the back of your neck. 4. Hold the stretch for 1-2 seconds. Repeat __________ times. Complete this exercise __________ times a day. Strengthening exercises Neck press 1. Lie on your back on a firm bed or on the floor with a pillow under  your head. 2. Use your neck muscles to push your head down on the pillow and straighten your spine. 3. Hold the position as well as you can. Keep your head facing up (in a neutral position) and your chin tucked. 4. Slowly count to 5 while holding this position. Repeat __________ times. Complete this exercise __________ times a day. Isometrics These are exercises in which you strengthen the muscles in your neck while keeping your neck still (isometrics). 1. Sit in a supportive chair and place your hand on your forehead. 2. Keep your head and face facing straight ahead. Do not flex or extend your neck while doing isometrics. 3. Push forward with your head and neck while pushing back with your hand. Hold for 10 seconds. 4. Do the sequence again, this time putting your hand against the back of your head. Use your head and neck to push backward against the hand pressure. 5. Finally, do the same exercise on either side of your head, pushing sideways against the pressure of your hand. Repeat __________ times. Complete this exercise __________ times a day. Prone head lifts 1. Lie face-down (prone position), resting on your elbows so that your chest and upper back are raised. 2. Start with your head facing downward, near your chest. Position your chin either on or near your chest. 3. Slowly lift your head upward. Lift until you are looking straight ahead. Then continue lifting your head as far back as you can comfortably stretch. 4. Hold your head up for 5 seconds. Then slowly lower it to your starting position. Repeat __________ times. Complete this exercise __________ times a day. Supine head lifts 1. Lie on your back (supine position), bending your knees to point to the ceiling and keeping your feet flat on the floor. 2. Lift your head slowly off the floor, raising your chin toward your chest. 3. Hold for 5 seconds. Repeat __________ times. Complete this exercise __________ times a day. Scapular  retraction 1. Stand with your arms at your sides. Look straight ahead. 2. Slowly pull both shoulders (scapulae) backward and downward (retraction) until you feel a stretch between your shoulder blades in your upper back. 3. Hold for 10-30 seconds. 4. Relax and repeat. Repeat __________ times. Complete this exercise __________ times a day. Contact a  health care provider if:  Your neck pain or discomfort gets much worse when you do an exercise.  Your neck pain or discomfort does not improve within 2 hours after you exercise. If you have any of these problems, stop exercising right away. Do not do the exercises again unless your health care provider says that you can. Get help right away if:  You develop sudden, severe neck pain. If this happens, stop exercising right away. Do not do the exercises again unless your health care provider says that you can. This information is not intended to replace advice given to you by your health care provider. Make sure you discuss any questions you have with your health care provider. Document Revised: 05/16/2018 Document Reviewed: 05/16/2018 Elsevier Patient Education  [place].

## 2020-03-11 NOTE — Progress Notes (Signed)
Chief Complaint  Patient presents with  . Back Pain  . Arm Pain  . Shoulder Pain   Acute visit  1. C/o right shoulder pain radiating to right triceps, right upper back and unable to weight bear triceps pain 5/10 today can be 8-10/10 at times and worse x 3 weeks no known trauma or injury but played sports previously. 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 He tried otc Tylenol no help and otc arthritis gels w/o help. Pain with ROM and pain with looking up with neck and feels like pain radiates from neck to right shoulder to triceps and stops at elbow at times if using left arm will feel pain into right shoulder and arm. Denies numbness/tingling down arm. He had reduced ROM right shoulder.   2. Colon cancer in remission per CT 01/2019 CEA 09/2019 normal. GI Dr. Havery Moros. He had significant amount of colon resected age 63 y.o and did not have radation or chemo.   3. Elevated BP per pt he states running 120s/80s at home and he has been monitoring   Review of Systems  Constitutional: Negative for weight loss.  HENT: Negative for hearing loss.   Eyes: Negative for blurred vision and redness.  Respiratory: Negative for shortness of breath.   Cardiovascular: Negative for chest pain.  Gastrointestinal: Negative for abdominal pain.  Musculoskeletal: Positive for back pain, joint pain and neck pain.  Skin: Negative for rash.  Neurological: Negative for headaches.   Past Medical History:  Diagnosis Date  . Allergy   . Arthritis    knees  . Colon cancer ([place])   . Headache    sinus headaches  . Sinus congestion    with pollen    Past Surgical History:  Procedure Laterality Date  . EYE SURGERY     as child  . LAPAROSCOPIC RIGHT COLECTOMY     Family History  Problem Relation Age of Onset  . Arthritis Other   . Breast cancer Other   . Stroke  Other   . Hypertension Other   . Heart attack Father 36  . Heart attack Paternal Grandfather 80  . Cancer Sister 84       breast cancer   . Cancer Maternal Aunt 70       ovarian cancer   . Cancer Maternal Aunt        colon polyps (4)  . Colon cancer Neg Hx   . Colon polyps Neg Hx   . Esophageal cancer Neg Hx   . Rectal cancer Neg Hx   . Stomach cancer Neg Hx    Social History   Socioeconomic History  . Marital status: Married    Spouse name: Jackelyn Poling  . Number of children: 3  . Years of education: Not on file  . Highest education level: Not on file  Occupational History  . Not on file  Tobacco Use  . Smoking status: Never Smoker  . Smokeless tobacco: Never Used  Substance and Sexual Activity  . Alcohol use: Yes    Alcohol/week: 2.0 standard drinks    Types: 2 Standard drinks or equivalent per week    Comment: 2 beers weekly  . Drug use: No  . Sexual activity: Not on file  Other Topics Concern  . Not on file  Social History Narrative   Fonnie Mu   #  3 children ages 5+   Employed as Research scientist (medical) in [place] Strain:   . Difficulty of Paying Living Expenses:   Food Insecurity:   . Worried About Charity fundraiser in the Last Year:   . Arboriculturist in the Last Year:   Transportation Needs:   . Film/video editor (Medical):   Marland Kitchen Lack of Transportation (Non-Medical):   Physical Activity:   . Days of Exercise per Week:   . Minutes of Exercise per Session:   Stress:   . Feeling of Stress :   Social Connections:   . Frequency of Communication with Friends and Family:   . Frequency of Social Gatherings with Friends and Family:   . Attends Religious Services:   . Active Member of Clubs or Organizations:   . Attends Archivist Meetings:   Marland Kitchen Marital Status:   Intimate Partner Violence:   . Fear of Current or Ex-Partner:   . Emotionally Abused:   Marland Kitchen Physically Abused:    . Sexually Abused:    Current Meds  Medication Sig  . Multiple Vitamin (MULTIVITAMIN WITH MINERALS) TABS tablet Take 1 tablet by mouth daily.   Allergies  Allergen Reactions  . Hydrocodone Other (See Comments)    Headache  . Oxycodone Other (See Comments)    Headache   No results found for this or any previous visit (from the past 2160 hour(s)). Objective  Body mass index is 34.56 kg/m. Wt Readings from Last 3 Encounters:  03/11/20 254 lb 12.8 oz (115.6 kg)  09/20/19 257 lb 14.4 oz (117 kg)  08/24/18 234 lb 12.8 oz (106.5 kg)   Temp Readings from Last 3 Encounters:  03/11/20 97.9 F (36.6 C) (Oral)  09/20/19 98.2 F (36.8 C) (Temporal)  08/24/18 98 F (36.7 C) (Oral)   BP Readings from Last 3 Encounters:  03/11/20 124/84  09/20/19 (!) 150/108  08/24/18 (!) 145/90   Pulse Readings from Last 3 Encounters:  03/11/20 75  09/20/19 76  08/24/18 (!) 58    Physical Exam Vitals and nursing note reviewed.  Constitutional:      Appearance: Normal appearance. He is well-developed and well-groomed.  HENT:     Head: Normocephalic and atraumatic.  Eyes:     Conjunctiva/sclera: Conjunctivae normal.     Pupils: Pupils are equal, round, and reactive to light.  Cardiovascular:     Rate and Rhythm: Normal rate and regular rhythm.     Heart sounds: Normal heart sounds. No murmur heard.   Pulmonary:     Effort: Pulmonary effort is normal.     Breath sounds: Normal breath sounds.  Musculoskeletal:     Right shoulder: Tenderness present. Decreased range of motion.     Cervical back: Pain with movement present.     Comments: Pain rad right shoulder with movement of neck extension and lateral rotation b/l  Skin:    General: Skin is warm and dry.  Neurological:     General: No focal deficit present.     Mental Status: He is alert and oriented to person, place, and time. Mental status is at baseline.     Gait: Gait normal.  Psychiatric:        Attention and Perception:  Attention and perception normal.        Mood and Affect: Mood and affect normal.        Speech: Speech normal.  Behavior: Behavior normal. Behavior is cooperative.        Thought Content: Thought content normal.        Cognition and Memory: Cognition and memory normal.        Judgment: Judgment normal.     Assessment  Plan  Acute pain of right shoulder radiating to right triceps and ext neck and lateral rotation neck causes sx's also wt bearing unable to do right arm and left arm movements cause right arm pain sx's etiology likely MSK ? Neck arthritis, rotator cuff/right shoulder arthritis - Plan: oxyCODONE-acetaminophen (PERCOCET) 5-325 MG tablet bid prn  depomedrol 80  Exercises neck and shoulder Consider ortho and PT wants to wait on Xrays   Cervicalgia - Plan: DG Cervical Spine Complete, oxyCODONE-acetaminophen (PERCOCET) 5-325 MG tablet  History of colon cancer h/o resection age 59 no radiation or chemo F/u upcoming GI 05/2020 Dr. Havery Moros CEA normal 09/2019  CT ab/pelvis negative 01/2019   Elevated blood pressure reading  Dash diet  Continue to monitor   HM  covid pifzer 2/2 pt to send card F/u PCP for lipid, PSA, tsh, vitamin D, UA, hep C in future Provider: Dr. Olivia Mackie McLean-Scocuzza-[facility]

## 2020-03-27 ENCOUNTER — Telehealth: Payer: Self-pay | Admitting: Family Medicine

## 2020-03-27 NOTE — Telephone Encounter (Signed)
Mychart sent informing patient

## 2020-03-27 NOTE — Telephone Encounter (Signed)
Pt needs a refill on oxyCODONE-acetaminophen (PERCOCET) 5-325 MG tablet

## 2020-03-27 NOTE — Telephone Encounter (Signed)
This medicine was prescribed by Dr. Terese Door for an acute issue.  It is not meant for recurrent use.  He will need an appointment to figure out what an appropriate next step is for his pain.

## 2020-04-01 ENCOUNTER — Encounter: Payer: Self-pay | Admitting: Family Medicine

## 2020-04-01 ENCOUNTER — Other Ambulatory Visit: Payer: Self-pay

## 2020-04-01 ENCOUNTER — Ambulatory Visit (INDEPENDENT_AMBULATORY_CARE_PROVIDER_SITE_OTHER): Payer: BC Managed Care – PPO | Admitting: Family Medicine

## 2020-04-01 DIAGNOSIS — R030 Elevated blood-pressure reading, without diagnosis of hypertension: Secondary | ICD-10-CM | POA: Diagnosis not present

## 2020-04-01 DIAGNOSIS — M5412 Radiculopathy, cervical region: Secondary | ICD-10-CM

## 2020-04-01 DIAGNOSIS — Z85038 Personal history of other malignant neoplasm of large intestine: Secondary | ICD-10-CM | POA: Diagnosis not present

## 2020-04-01 MED ORDER — PREDNISONE 50 MG PO TABS: 50.0000 mg | ORAL_TABLET | Freq: Every day | ORAL | 0 refills | Status: DC

## 2020-04-01 NOTE — Assessment & Plan Note (Signed)
Issue with intermittently elevated blood pressure though more recently he reports BPs have been acceptable at home.  Suspect blood pressure is elevated today due to his pain.  He will continue to monitor at home and if it trends up above 130/80 consistently at home he will let us know.

## 2020-04-01 NOTE — Assessment & Plan Note (Signed)
He will keep his appointment for his colonoscopy.

## 2020-04-01 NOTE — Progress Notes (Signed)
Tommi Rumps, MD Phone: [phone]  Arthur West is a 59 y.o. male who presents today for f/u.  Right arm pain: Patient notes this started around 1 August.  No injury.  Notes most any movement of his right upper extremity will cause discomfort particularly when he moves at his shoulder.  He notes no neck pain though he does note the pain radiates down from his neck down the back of his arm to his elbow.  He noted several days ago he developed some tingling in the tips of his fingers on the right side and notes that went away on its own.  He has had no other tingling or numbness.  He does note his right arm feels weak compared with the left arm though that may be related to the pain.  Percocet helps somewhat.  Elevated BP: Patient notes his blood pressures been running in the 120s over 80 range.  No chest pain or shortness of breath.  Had been elevated through his oncologist's office though has been more normal recently at home.  History of colon cancer: Patient continues to follow with oncology.  He reports he has a colonoscopy scheduled for next month.  Social History   Tobacco Use  Smoking Status Never Smoker  Smokeless Tobacco Never Used     ROS see history of present illness  Objective  Physical Exam Vitals:   04/01/20 1558  BP: 140/90  Pulse: 78  Temp: 98.3 F (36.8 C)  SpO2: 97%    BP Readings from Last 3 Encounters:  04/01/20 140/90  03/11/20 124/84  09/20/19 (!) 150/108   Wt Readings from Last 3 Encounters:  04/01/20 254 lb 6.4 oz (115.4 kg)  03/11/20 254 lb 12.8 oz (115.6 kg)  09/20/19 257 lb 14.4 oz (117 kg)    Physical Exam Constitutional:      General: He is not in acute distress.    Appearance: He is not diaphoretic.  Cardiovascular:     Rate and Rhythm: Normal rate and regular rhythm.     Heart sounds: Normal heart sounds.  Pulmonary:     Effort: Pulmonary effort is normal.     Breath sounds: Normal breath sounds.  Musculoskeletal:      Comments: No midline neck tenderness, no midline neck step-off, no tenderness of the trapezius muscles, shoulders are symmetric with no tenderness, no tenderness of either upper arm, full passive range of motion bilateral shoulders, active range of motion right shoulder is limited by pain, full active range of motion left shoulder, Spurling's testing attempted on the right though this soon as the patient tried to tilt his head backwards he developed radicular symptoms into his posterior right upper extremity, negative Spurling's on the left, negative empty can bilaterally  Skin:    General: Skin is warm and dry.  Neurological:     Mental Status: He is alert.     Comments: 5/5 strength in bilateral biceps, triceps, grip, quads, hamstrings, plantar and dorsiflexion, sensation to light touch intact in bilateral UE and LE, normal gait, 2+ patellar, biceps, brachioradialis reflexes       Assessment/Plan: Please see individual problem list.  Cervical radiculopathy Symptoms are most consistent with cervical radiculopathy.  I doubt his symptoms are coming from shoulder pathology though it is possible there could be some mild rotator cuff impingement.  We will treat with prednisone.  Discussed risk of agitation, sleep difficulty, and excessive appetite with this.  Advised to discontinue this if he becomes excessively agitated.  Advised to seek medical attention if he develops numbness, weakness, or significantly worsening symptoms.  He will call us on Friday or early next week if his symptoms are not improving with the steroids.  Discussed physical therapy would be next if his symptoms have improved to a certain degree.  If not improving I would likely refer him to a specialist and obtain an MRI.  Elevated blood pressure reading in office without diagnosis of hypertension Issue with intermittently elevated blood pressure though more recently he reports BPs have been acceptable at home.  Suspect blood  pressure is elevated today due to his pain.  He will continue to monitor at home and if it trends up above 130/80 consistently at home he will let us know.  History of colon cancer He will keep his appointment for his colonoscopy.   No orders of the defined types were placed in this encounter.   Meds ordered this encounter  Medications  . predniSONE (DELTASONE) 50 MG tablet    Sig: Take 1 tablet (50 mg total) by mouth daily with breakfast.    Dispense:  5 tablet    Refill:  0    This visit occurred during the SARS-CoV-2 public health emergency.  Safety protocols were in place, including screening questions prior to the visit, additional usage of staff PPE, and extensive cleaning of exam room while observing appropriate contact time as indicated for disinfecting solutions.    Tommi Rumps, MD [place]

## 2020-04-01 NOTE — Patient Instructions (Addendum)
Nice to see you. I suspect you have a pinched nerve in your neck leading to your symptoms.  Please start on the prednisone.  This may make it difficult for you to sleep so you may consider starting it tomorrow morning versus tonight with a meal.  You may eat more than typical on this.  If you become agitated on this please discontinue the prednisone. If you develop weakness, persistent numbness, or any significantly worsening symptoms please seek medical attention in the emergency department. Please call us on Tuesday to let us know how your symptoms are doing. Please continue to monitor your blood pressure.  As long as it is consistently less than 130/80 at home we can follow-up in 6 months.  If it trends up higher than that please let us know.

## 2020-04-01 NOTE — Assessment & Plan Note (Signed)
Symptoms are most consistent with cervical radiculopathy.  I doubt his symptoms are coming from shoulder pathology though it is possible there could be some mild rotator cuff impingement.  We will treat with prednisone.  Discussed risk of agitation, sleep difficulty, and excessive appetite with this.  Advised to discontinue this if he becomes excessively agitated.  Advised to seek medical attention if he develops numbness, weakness, or significantly worsening symptoms.  He will call us on Friday or early next week if his symptoms are not improving with the steroids.  Discussed physical therapy would be next if his symptoms have improved to a certain degree.  If not improving I would likely refer him to a specialist and obtain an MRI.

## 2020-04-09 ENCOUNTER — Telehealth: Payer: Self-pay | Admitting: Family Medicine

## 2020-04-09 NOTE — Telephone Encounter (Signed)
Patient called in stated that the medicationpredniSONE (DELTASONE) 50 MG tablet that Dr.Sonnenberg prescribed for him did not work he is still in pain.

## 2020-04-13 NOTE — Telephone Encounter (Signed)
Patient called in stated that the medicationpredniSONE (DELTASONE) 50 MG tablet that Dr.Sonnenberg prescribed for him did not work he is still in pain.  Roshad Hack,cma

## 2020-04-13 NOTE — Telephone Encounter (Signed)
Given that the prednisone has not helped the next step would be seeing specialist for this to help determine a specific treatment and confirm the cause of his pain. The quickest way to be seen is at [facility] urgent walk in clinic in [place]. They at open 1-7 pm M-F.

## 2020-04-13 NOTE — Telephone Encounter (Signed)
Lvm for the patient to call back for information on his pain.  Ernesta Trabert,cma

## 2020-04-21 ENCOUNTER — Ambulatory Visit: Payer: BC Managed Care – PPO | Admitting: Family Medicine

## 2020-04-21 DIAGNOSIS — Z0289 Encounter for other administrative examinations: Secondary | ICD-10-CM

## 2020-04-28 ENCOUNTER — Ambulatory Visit (AMBULATORY_SURGERY_CENTER): Payer: BC Managed Care – PPO | Admitting: *Deleted

## 2020-04-28 ENCOUNTER — Other Ambulatory Visit: Payer: Self-pay

## 2020-04-28 VITALS — Ht 72.0 in | Wt 249.6 lb

## 2020-04-28 DIAGNOSIS — Z85038 Personal history of other malignant neoplasm of large intestine: Secondary | ICD-10-CM

## 2020-04-28 DIAGNOSIS — Z86010 Personal history of colonic polyps: Secondary | ICD-10-CM

## 2020-04-28 MED ORDER — SUTAB 1479-225-188 MG PO TABS: 1.0000 | ORAL_TABLET | ORAL | 0 refills | Status: DC

## 2020-04-28 NOTE — Progress Notes (Signed)
Patient denies any allergies to egg or soy products. Patient denies complications with anesthesia/sedation.  Patient denies oxygen use at home and denies diet medications. Emmi instructions for colonoscopy explained and sent via MyChart.

## 2020-04-29 ENCOUNTER — Encounter: Payer: Self-pay | Admitting: Gastroenterology

## 2020-05-05 ENCOUNTER — Telehealth: Payer: Self-pay | Admitting: Gastroenterology

## 2020-05-05 NOTE — Telephone Encounter (Signed)
LM on VM for pt to call back to discuss different prep.

## 2020-05-05 NOTE — Telephone Encounter (Signed)
Phoned [facility] in [place] to see if they were aware of the attached coupon for the sutab.  She was able to run it through again with the coupon and cost would be $40.  LM on pts VM to let him know the cost with coupon.  Pt is to call back with any further issues.

## 2020-05-05 NOTE — Telephone Encounter (Signed)
Patient called states the prep medication is too expensive and would like to know if he can have the liquid prep instead

## 2020-05-12 ENCOUNTER — Ambulatory Visit (AMBULATORY_SURGERY_CENTER): Payer: BC Managed Care – PPO | Admitting: Gastroenterology

## 2020-05-12 ENCOUNTER — Encounter: Payer: Self-pay | Admitting: Gastroenterology

## 2020-05-12 ENCOUNTER — Other Ambulatory Visit: Payer: Self-pay

## 2020-05-12 VITALS — BP 134/92 | HR 51 | Temp 97.9°F | Resp 12 | Ht 72.0 in | Wt 249.0 lb

## 2020-05-12 DIAGNOSIS — D122 Benign neoplasm of ascending colon: Secondary | ICD-10-CM | POA: Diagnosis not present

## 2020-05-12 DIAGNOSIS — D127 Benign neoplasm of rectosigmoid junction: Secondary | ICD-10-CM | POA: Diagnosis not present

## 2020-05-12 DIAGNOSIS — Z85038 Personal history of other malignant neoplasm of large intestine: Secondary | ICD-10-CM

## 2020-05-12 DIAGNOSIS — D128 Benign neoplasm of rectum: Secondary | ICD-10-CM

## 2020-05-12 MED ORDER — SODIUM CHLORIDE 0.9 % IV SOLN: 500.0000 mL | Freq: Once | INTRAVENOUS | Status: DC

## 2020-05-12 NOTE — Progress Notes (Signed)
Called to room to assist during endoscopic procedure.  Patient ID and intended procedure confirmed with present staff. Received instructions for my participation in the procedure from the performing physician.   Staple removed from colon per MD, no need to send for path.

## 2020-05-12 NOTE — Progress Notes (Signed)
Report given to PACU, vss 

## 2020-05-12 NOTE — Patient Instructions (Signed)
Handout on polyps and diverticulosis given    YOU HAD AN ENDOSCOPIC PROCEDURE TODAY AT THE [facility]:   Refer to the procedure report that was given to you for any specific questions about what was found during the examination.  If the procedure report does not answer your questions, please call your gastroenterologist to clarify.  If you requested that your care partner not be given the details of your procedure findings, then the procedure report has been included in a sealed envelope for you to review at your convenience later.  YOU SHOULD EXPECT: Some feelings of bloating in the abdomen. Passage of more gas than usual.  Walking can help get rid of the air that was put into your GI tract during the procedure and reduce the bloating. If you had a lower endoscopy (such as a colonoscopy or flexible sigmoidoscopy) you may notice spotting of blood in your stool or on the toilet paper. If you underwent a bowel prep for your procedure, you may not have a normal bowel movement for a few days.  Please Note:  You might notice some irritation and congestion in your nose or some drainage.  This is from the oxygen used during your procedure.  There is no need for concern and it should clear up in a day or so.  SYMPTOMS TO REPORT IMMEDIATELY:   Following lower endoscopy (colonoscopy or flexible sigmoidoscopy):  Excessive amounts of blood in the stool  Significant tenderness or worsening of abdominal pains  Swelling of the abdomen that is new, acute  Fever of 100F or higher   For urgent or emergent issues, a gastroenterologist can be reached at any hour by calling [phone]. Do not use MyChart messaging for urgent concerns.    DIET:  We do recommend a small meal at first, but then you may proceed to your regular diet.  Drink plenty of fluids but you should avoid alcoholic beverages for 24 hours.  ACTIVITY:  You should plan to take it easy for the rest of today and you should NOT  DRIVE or use heavy machinery until tomorrow (because of the sedation medicines used during the test).    FOLLOW UP: Our staff will call the number listed on your records 48-72 hours following your procedure to check on you and address any questions or concerns that you may have regarding the information given to you following your procedure. If we do not reach you, we will leave a message.  We will attempt to reach you two times.  During this call, we will ask if you have developed any symptoms of COVID 19. If you develop any symptoms (ie: fever, flu-like symptoms, shortness of breath, cough etc.) before then, please call [phone].  If you test positive for Covid 19 in the 2 weeks post procedure, please call and report this information to us.    If any biopsies were taken you will be contacted by phone or by letter within the next 1-3 weeks.  Please call us at [phone] if you have not heard about the biopsies in 3 weeks.    SIGNATURES/CONFIDENTIALITY: You and/or your care partner have signed paperwork which will be entered into your electronic medical record.  These signatures attest to the fact that that the information above on your After Visit Summary has been reviewed and is understood.  Full responsibility of the confidentiality of this discharge information lies with you and/or your care-partner. 

## 2020-05-12 NOTE — Op Note (Signed)
[facility] Patient Name: Arthur West Procedure Date: 05/12/2020 8:41 AM MRN: [id number] Endoscopist: Remo Lipps P. Havery Moros , MD Age: 59 Referring MD:  Date of Birth: [birth year] Gender: Male Account #: [id number] Procedure:                Colonoscopy Indications:              High risk colon cancer surveillance: Personal                            history of colon cancer (dx 2017), last colonoscopy                            2018 without any high risk lesions Medicines:                Monitored Anesthesia Care Procedure:                Pre-Anesthesia Assessment:                           - Prior to the procedure, a History and Physical                            was performed, and patient medications and                            allergies were reviewed. The patient's tolerance of                            previous anesthesia was also reviewed. The risks                            and benefits of the procedure and the sedation                            options and risks were discussed with the patient.                            All questions were answered, and informed consent                            was obtained. Prior Anticoagulants: The patient has                            taken no previous anticoagulant or antiplatelet                            agents. ASA Grade Assessment: II - A patient with                            mild systemic disease. After reviewing the risks                            and benefits, the patient was deemed in  satisfactory condition to undergo the procedure.                           After obtaining informed consent, the colonoscope                            was passed under direct vision. Throughout the                            procedure, the patient's blood pressure, pulse, and                            oxygen saturations were monitored continuously. The                            Colonoscope was  introduced through the anus and                            advanced to the the terminal ileum. The colonoscopy                            was performed without difficulty. The patient                            tolerated the procedure well. The quality of the                            bowel preparation was good. The terminal ileum,                            surgical anastomosis, and the rectum were                            photographed. Scope In: 8:55:25 AM Scope Out: 9:15:56 AM Scope Withdrawal Time: 0 hours 17 minutes 54 seconds  Total Procedure Duration: 0 hours 20 minutes 31 seconds  Findings:                 The perianal and digital rectal examinations were                            normal.                           There was evidence of a prior end-to-end                            ileo-colonic anastomosis in the distal ascending                            colon. This was patent and was characterized by a                            protruding staple. Removal of a staple was  accomplished with a regular forceps.                           The terminal ileum appeared normal.                           Three sessile polyps were found in the ascending                            colon. The polyps were 3 mm in size. These polyps                            were removed with a cold snare. Resection and                            retrieval were complete.                           A 3 mm polyp was found in the recto-sigmoid colon.                            The polyp was sessile. The polyp was removed with a                            cold snare. Resection and retrieval were complete.                           A 3 mm polyp was found in the rectum. The polyp was                            sessile. The polyp was removed with a cold snare.                            Resection and retrieval were complete.                           A few small-mouthed diverticula were  found in the                            sigmoid colon.                           Internal hemorrhoids were found during retroflexion.                           The exam was otherwise without abnormality. Complications:            No immediate complications. Estimated blood loss:                            Minimal. Estimated Blood Loss:     Estimated blood loss was minimal. Impression:               - Patent end-to-end ileo-colonic anastomosis,  characterized by a protruding staple which was                            removed with forceps.                           - The examined portion of the ileum was normal.                           - Three 3 mm polyps in the ascending colon, removed                            with a cold snare. Resected and retrieved.                           - One 3 mm polyp at the recto-sigmoid colon,                            removed with a cold snare. Resected and retrieved.                           - One 3 mm polyp in the rectum, removed with a cold                            snare. Resected and retrieved.                           - Diverticulosis in the sigmoid colon.                           - Internal hemorrhoids.                           - The examination was otherwise normal. Recommendation:           - Patient has a contact number available for                            emergencies. The signs and symptoms of potential                            delayed complications were discussed with the                            patient. Return to normal activities tomorrow.                            Written discharge instructions were provided to the                            patient.                           - Resume previous diet.                           -  Continue present medications.                           - Await pathology results. Remo Lipps P. Anzal Bartnick, MD 05/12/2020 9:23:04 AM This report has been signed  electronically.

## 2020-05-12 NOTE — Progress Notes (Signed)
Pt's states no medical or surgical changes since previsit or office visit. 

## 2020-05-14 ENCOUNTER — Telehealth: Payer: Self-pay

## 2020-05-14 NOTE — Telephone Encounter (Signed)
Pt is requesting a call back. 

## 2020-05-14 NOTE — Telephone Encounter (Signed)
Unable to leave message mailbox full.

## 2020-07-09 ENCOUNTER — Telehealth: Payer: Self-pay

## 2020-07-09 DIAGNOSIS — Z01818 Encounter for other preprocedural examination: Secondary | ICD-10-CM

## 2020-07-09 DIAGNOSIS — R7303 Prediabetes: Secondary | ICD-10-CM

## 2020-07-09 DIAGNOSIS — I10 Essential (primary) hypertension: Secondary | ICD-10-CM

## 2020-07-10 NOTE — Telephone Encounter (Signed)
I called and LVM for the patient to call and schedule a medical clearance appointment with the provider.  Minetta Krisher,cma

## 2020-07-10 NOTE — Telephone Encounter (Signed)
Noted. Please follow-up with the patient to get an appointment scheduled.

## 2020-08-10 ENCOUNTER — Emergency Department
Admission: EM | Admit: 2020-08-10 | Discharge: 2020-08-10 | Disposition: A | Discharge status: Home / Self Care | Payer: BC Managed Care – PPO | Attending: Emergency Medicine | Admitting: Emergency Medicine

## 2020-08-10 ENCOUNTER — Encounter: Payer: Self-pay | Admitting: Emergency Medicine

## 2020-08-10 ENCOUNTER — Other Ambulatory Visit: Payer: Self-pay

## 2020-08-10 DIAGNOSIS — J3489 Other specified disorders of nose and nasal sinuses: Secondary | ICD-10-CM

## 2020-08-10 DIAGNOSIS — J329 Chronic sinusitis, unspecified: Secondary | ICD-10-CM | POA: Insufficient documentation

## 2020-08-10 DIAGNOSIS — Z20822 Contact with and (suspected) exposure to covid-19: Secondary | ICD-10-CM | POA: Diagnosis not present

## 2020-08-10 DIAGNOSIS — Z85038 Personal history of other malignant neoplasm of large intestine: Secondary | ICD-10-CM | POA: Insufficient documentation

## 2020-08-10 DIAGNOSIS — R42 Dizziness and giddiness: Secondary | ICD-10-CM | POA: Diagnosis not present

## 2020-08-10 LAB — POC SARS CORONAVIRUS 2 AG -  ED: SARS Coronavirus 2 Ag: NEGATIVE

## 2020-08-10 MED ORDER — MECLIZINE HCL 25 MG PO TABS
50.0000 mg | ORAL_TABLET | Freq: Once | ORAL | Status: AC
  Administered 2020-08-10: 50 mg via ORAL
  Filled 2020-08-10: qty 2

## 2020-08-10 NOTE — ED Provider Notes (Signed)
[facility] Emergency Department Provider Note  ____________________________________________   Event Date/Time   First MD Initiated Contact with Patient 08/10/20 1925     (approximate)  I have reviewed the triage vital signs and the nursing notes.   HISTORY  Chief Complaint Facial Pain and Dizziness  HPI Arthur West is a 60 y.o. male who presents to the emergency department for evaluation of sinus pressure and dizziness.  The patient reports that he has had sinus pressure for 3 days and the dizziness started just after.  He describes the dizziness as being brief and momentary just after changing position of his head or when going from sitting to standing.  He states that he has had history of similar in the past when he had another sinus infection.  He initially reported to [facility] clinic urgent care and was referred here for further evaluation.  He states that they swabbed him for COVID send out test there, but he does not have the results.  He was exposed to a person at work who is COVID-positive last week.  He denies any chest pain, shortness of breath, headache.  He reports sinus pressure, nasal congestion and ear pressure bilaterally.  He has not taken anything for relief, and movement makes his symptoms worse.         Past Medical History:  Diagnosis Date  . Arthritis    knees  . Colon cancer (HCC)   . Headache    sinus headaches  . Sinus congestion    with pollen     Patient Active Problem List   Diagnosis Date Noted  . Cervical radiculopathy 04/01/2020  . History of colon cancer 03/11/2020  . Chronic low back pain 02/21/2018  . Cancer of ascending colon s/p robotic colectomy 01/29/2016 01/29/2016  . Prediabetes 12/23/2015  . Osteoarthritis of both knees 12/23/2015  . Elevated blood pressure reading in office without diagnosis of hypertension 12/23/2015  . Other fatigue 11/23/2015  . Lead exposure 11/23/2015    Past Surgical History:   Procedure Laterality Date  . COLONOSCOPY  01/2017   [place] ta polyp   . EYE SURGERY     as child  . LAPAROSCOPIC RIGHT COLECTOMY     hx colono cancer    Prior to Admission medications   Medication Sig Start Date End Date Taking? Authorizing Provider  Multiple Vitamin (MULTIVITAMIN WITH MINERALS) TABS tablet Take 1 tablet by mouth daily.    [provider]    Allergies Hydrocodone and Oxycodone  Family History  Problem Relation Age of Onset  . Arthritis Other   . Breast cancer Other   . Stroke Other   . Hypertension Other   . Heart attack Father 91  . Heart attack Paternal Grandfather 26  . Cancer Sister 89       breast cancer   . Cancer Maternal Aunt 70       ovarian cancer   . Cancer Maternal Aunt        colon polyps (4)  . Colon cancer Neg Hx   . Colon polyps Neg Hx   . Esophageal cancer Neg Hx   . Rectal cancer Neg Hx   . Stomach cancer Neg Hx     Social History Social History   Tobacco Use  . Smoking status: Never Smoker  . Smokeless tobacco: Never Used  Vaping Use  . Vaping Use: Never used  Substance Use Topics  . Alcohol use: Yes    Alcohol/week: 2.0  standard drinks    Types: 2 Standard drinks or equivalent per week    Comment: 2 beers weekly  . Drug use: No    Review of Systems Constitutional: No fever/chills Eyes: No visual changes. ENT: + Sinus pressure, + ear pressure, no sore throat. Cardiovascular: Denies chest pain. Respiratory: Denies shortness of breath. Gastrointestinal: No abdominal pain.  No nausea, no vomiting.  No diarrhea.  No constipation. Genitourinary: Negative for dysuria. Musculoskeletal: Negative for back pain. Skin: Negative for rash. Neurological: + Dizziness with change in position, negative for headaches, focal weakness or numbness.  ____________________________________________   PHYSICAL EXAM:  VITAL SIGNS: ED Triage Vitals  Enc Vitals Group     BP 08/10/20 1739 (!) 152/96     Pulse Rate 08/10/20  1739 64     Resp 08/10/20 1739 18     Temp 08/10/20 1739 98 F (36.7 C)     Temp Source 08/10/20 1739 Oral     SpO2 08/10/20 1739 97 %     Weight 08/10/20 1740 245 lb (111.1 kg)     Height 08/10/20 1740 6' (1.829 m)     Head Circumference --      Peak Flow --      Pain Score 08/10/20 1740 7     Pain Loc --      Pain Edu? --      Excl. in [place]? --    Constitutional: Alert and oriented. Well appearing and in no acute distress. Eyes: Conjunctivae are normal. PERRL. EOMI. Head: Atraumatic. Nose: Mild congestion/rhinnorhea.  Mild tenderness to palpation of the maxillary sinuses. Ears: The left TM is visualized, bulging with obvious fluid level without erythema.  Right TM is visualized and normal. Mouth/Throat: Mucous membranes are moist.  Oropharynx non-erythematous. Neck: No stridor.   Lymphatic: Bilateral anterior cervical lymphadenopathy. Cardiovascular: Normal rate, regular rhythm. Grossly normal heart sounds.  Good peripheral circulation. Respiratory: Normal respiratory effort.  No retractions. Lungs CTAB. Gastrointestinal: Soft and nontender. No distention. No abdominal bruits. No CVA tenderness. Musculoskeletal: No lower extremity tenderness nor edema.  No joint effusions. Neurologic:  Normal speech and language. No gross focal neurologic deficits are appreciated. No gait instability. Skin:  Skin is warm, dry and intact. No rash noted. Psychiatric: Mood and affect are normal. Speech and behavior are normal.  ____________________________________________   LABS (all labs ordered are listed, but only abnormal results are displayed)  Labs Reviewed  POC SARS CORONAVIRUS 2 AG -  ED    ____________________________________________   INITIAL IMPRESSION / ASSESSMENT AND PLAN / ED COURSE  As part of my medical decision making, I reviewed the following data within the [place] notes reviewed and incorporated and Labs reviewed         Patient is a  60 year old male who presents to the emergency department for evaluation of sinus pressure for the last 3 days with accompanying dizziness for the last 2-1/2.  The patient was tested for COVID in the [facility] urgent care clinic but does not have the results yet.  Overall, the patient has only had symptoms for 3 days with no fever.  He reports his dizziness to be only with change in position and is very temporary and quickly subsides if he is still.  He denies any chest pain, shortness of breath, fevers.  In triage, the patient is mildly hypertensive but otherwise has normal vital signs.  Physical exam does reveal some obvious fluid behind the left TM without evidence of acute  infection.  There is also some tenderness over the maxillary sinuses and cervical lymphadenopathy.  Otherwise Zickel exam is within normal limits.  Rapid COVID test was performed and is negative.  Was counseled on the sensitivity of these test and will also await the PCR testing that was sent from his urgent care.  Overall, the patient's symptoms are most consistent with viral URI, though could be COVID or other viral illness.  The patient's dizziness is most consistent with positional vertigo, particularly considering that these are brief periods that quickly go away after he stops moving.  He also has history of this with previous URI.  We will begin treatment with meclizine for the patient's dizziness as well as fluticasone for sinus congestion.  Given that the patient has only had symptoms for 3 days and no fever, he does not meet criteria for treatment with antibiotics for his sinusitis.  The patient is amenable with this plan he was given strict return precautions, particularly regarding the dizziness.  He will return if any acute worsening.  Otherwise patient will follow-up with primary care.      ____________________________________________   FINAL CLINICAL IMPRESSION(S) / ED DIAGNOSES  Final diagnoses:  Sinus pressure   Dizziness     ED Discharge Orders    None      *Please note:  Arthur West was evaluated in Emergency Department on 08/10/2020 for the symptoms described in the history of present illness. He was evaluated in the context of the global COVID-19 pandemic, which necessitated consideration that the patient might be at risk for infection with the SARS-CoV-2 virus that causes COVID-19. Institutional protocols and algorithms that pertain to the evaluation of patients at risk for COVID-19 are in a state of rapid change based on information released by regulatory bodies including the CDC and federal and state organizations. These policies and algorithms were followed during the patient's care in the ED.  Some ED evaluations and interventions may be delayed as a result of limited staffing during and the pandemic.*   Note:  This document was prepared using Dragon voice recognition software and may include unintentional dictation errors.    Marlana Salvage, PA 08/10/20 Joie Bimler    Nance Pear, MD 08/11/20 1816
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
## 2020-08-10 NOTE — Discharge Instructions (Addendum)
You may take Meclizine, 25mg  every 8 hours as needed. You may use Flonase 2 sprays per nostril once daily for 7 days, then decrease to one spray per nostril. Please follow up with your primary care as scheduled.

## 2020-08-11 ENCOUNTER — Other Ambulatory Visit: Payer: Self-pay

## 2020-08-11 ENCOUNTER — Telehealth: Payer: Self-pay | Admitting: Family Medicine

## 2020-08-11 NOTE — Telephone Encounter (Signed)
He had a PCR at [facility] that has not resulted. Either way he can not come in to the office with his current symptoms. It sounds like he at least has some type of viral illness based on the ED note and this could be COVID so he can not come in based on our current protocols. We can get him rescheduled for a time after he would be able to come in to the office for the clearance visit. He needs to stay quarantined at home until they contact him with the result and then if it is negative he can come off of quarantine. Please see what symptoms he is having currently. Thanks.

## 2020-08-11 NOTE — Telephone Encounter (Signed)
Pt called he is having a headache and dizziness was seen in the ED yesterday for sinus pressure and dizziness he said they told him that he had Vertigo and gave medication for that but it is not helping. They told him that he needed to contact his PCP to get further treatment  Patient is scheduled to come in on 1/14 for a medical clearance

## 2020-08-11 NOTE — Telephone Encounter (Signed)
    Patient was informed that until he get other covid test results he has to do virtual visit because of symptoms he has    Patients results were negative, can he still come in for his medical clearance.  Nithin Demeo,cma

## 2020-08-11 NOTE — Telephone Encounter (Signed)
I did speak with the patient and informed him that the provider was waiting on his covid results to come back. Patient stated he needed something for dizziness and I informed him that we need the results back and then we could discuss his symptoms further and he understood.  Arthur West,cma

## 2020-08-11 NOTE — Telephone Encounter (Signed)
Patient was informed that until he get other covid test results he has to do virtual visit because of symptoms he has

## 2020-08-11 NOTE — Telephone Encounter (Signed)
Jasmine from patient relations at [facility] called on pt's behalf. She states that pt needs something for dizziness. Upon looking at his chart, we saw that pt is negative for covid swab but pt is not aware. Please call pt to discuss further. I scheduled him an appt with [place] on Thursday but pt may not want to wait to get treated? Please advise

## 2020-08-13 ENCOUNTER — Encounter: Payer: Self-pay | Admitting: Internal Medicine

## 2020-08-13 ENCOUNTER — Telehealth (INDEPENDENT_AMBULATORY_CARE_PROVIDER_SITE_OTHER): Payer: BC Managed Care – PPO | Admitting: Internal Medicine

## 2020-08-13 ENCOUNTER — Other Ambulatory Visit: Payer: Self-pay

## 2020-08-13 VITALS — Ht 72.0 in | Wt 245.0 lb

## 2020-08-13 DIAGNOSIS — R519 Headache, unspecified: Secondary | ICD-10-CM

## 2020-08-13 DIAGNOSIS — I10 Essential (primary) hypertension: Secondary | ICD-10-CM | POA: Insufficient documentation

## 2020-08-13 DIAGNOSIS — J329 Chronic sinusitis, unspecified: Secondary | ICD-10-CM

## 2020-08-13 DIAGNOSIS — R42 Dizziness and giddiness: Secondary | ICD-10-CM | POA: Insufficient documentation

## 2020-08-13 MED ORDER — AMLODIPINE BESYLATE 2.5 MG PO TABS: 2.5000 mg | ORAL_TABLET | Freq: Every day | ORAL | 3 refills | Status: DC

## 2020-08-13 MED ORDER — MECLIZINE HCL 25 MG PO CHEW: 1.0000 | CHEWABLE_TABLET | Freq: Three times a day (TID) | ORAL | 0 refills | Status: DC | PRN

## 2020-08-13 NOTE — Patient Instructions (Addendum)
Check blood pressure 2 hours after taking your medication in the am norvasc 2.5 mg daily    Nasal saline, flonase  Allergy pill zyrtec, claritin, allegra, xyzal  Consider seeing ENT let us know if you would referral and what dates best fit you    Dizziness Dizziness is a common problem. It is a feeling of unsteadiness or light-headedness. You may feel like you are about to faint. Dizziness can lead to injury if you stumble or fall. Anyone can become dizzy, but dizziness is more common in older adults. This condition can be caused by a number of things, including medicines, dehydration, or illness. Follow these instructions at home: Eating and drinking  Drink enough fluid to keep your urine clear or pale yellow. This helps to keep you from becoming dehydrated. Try to drink more clear fluids, such as water.  Do not drink alcohol.  Limit your caffeine intake if told to do so by your health care provider. Check ingredients and nutrition facts to see if a food or beverage contains caffeine.  Limit your salt (sodium) intake if told to do so by your health care provider. Check ingredients and nutrition facts to see if a food or beverage contains sodium. Activity  Avoid making quick movements. ? Rise slowly from chairs and steady yourself until you feel okay. ? In the morning, first sit up on the side of the bed. When you feel okay, stand slowly while you hold onto something until you know that your balance is fine.  If you need to stand in one place for a long time, move your legs often. Tighten and relax the muscles in your legs while you are standing.  Do not drive or use heavy machinery if you feel dizzy.  Avoid bending down if you feel dizzy. Place items in your home so that they are easy for you to reach without leaning over. Lifestyle  Do not use any products that contain nicotine or tobacco, such as cigarettes and e-cigarettes. If you need help quitting, ask your health care  provider.  Try to reduce your stress level by using methods such as yoga or meditation. Talk with your health care provider if you need help to manage your stress. General instructions  Watch your dizziness for any changes.  Take over-the-counter and prescription medicines only as told by your health care provider. Talk with your health care provider if you think that your dizziness is caused by a medicine that you are taking.  Tell a friend or a family member that you are feeling dizzy. If he or she notices any changes in your behavior, have this person call your health care provider.  Keep all follow-up visits as told by your health care provider. This is important. Contact a health care provider if:  Your dizziness does not go away.  Your dizziness or light-headedness gets worse.  You feel nauseous.  You have reduced hearing.  You have new symptoms.  You are unsteady on your feet or you feel like the room is spinning. Get help right away if:  You vomit or have diarrhea and are unable to eat or drink anything.  You have problems talking, walking, swallowing, or using your arms, hands, or legs.  You feel generally weak.  You are not thinking clearly or you have trouble forming sentences. It may take a friend or family member to notice this.  You have chest pain, abdominal pain, shortness of breath, or sweating.  Your vision changes.  You have any bleeding.  You have a severe headache.  You have neck pain or a stiff neck.  You have a fever. These symptoms may represent a serious problem that is an emergency. Do not wait to see if the symptoms will go away. Get medical help right away. Call your local emergency services (911 in the [place] Do not drive yourself to the hospital. Summary  Dizziness is a feeling of unsteadiness or light-headedness. This condition can be caused by a number of things, including medicines, dehydration, or illness.  Anyone can become dizzy,  but dizziness is more common in older adults.  Drink enough fluid to keep your urine clear or pale yellow. Do not drink alcohol.  Avoid making quick movements if you feel dizzy. Monitor your dizziness for any changes. This information is not intended to replace advice given to you by your health care provider. Make sure you discuss any questions you have with your health care provider. Document Revised: 07/21/2017 Document Reviewed: 08/20/2016 Elsevier Patient Education  2021 [employer].  2019 Novel Coronavirus (CoVID-19), NAA - [facility] (08/10/2020 12:48 PM EST) 2019 Novel Coronavirus (CoVID-19), NAA - [facility] (08/10/2020 12:48 PM EST)  Component Value Ref Range Performed At [facility]  SARS-COV-2, NAA - [facility] Not Detected Comment:  This nucleic acid amplification test was developed and its performance characteristics determined by [employer]. Nucleic acid amplification tests include RT-PCR and TMA. This test has not been FDA cleared or approved. This test has been authorized by FDA under an Emergency Use Authorization (EUA). This test is only authorized for the duration of time the declaration that circumstances exist justifying the authorization of the emergency use of in vitro diagnostic tests for detection of SARS-CoV-2 virus and/or diagnosis of COVID-19 infection under section 564(b)(1) of the Act, 21 U.S.C. 426STM-1(D) (1), unless the authorization is terminated or revoked sooner. When diagnostic testing is negative, the possibility of a false negative result should be considered in the context of a patient's recent exposures and the presence of clinical signs and symptoms consistent with COVID-19. An individual without symptoms of COVID-19 and who is not shedding SARS-CoV-2 virus would expect to have a negative (not detected) result in this assay. Not Detected [facility]    2019 Novel Coronavirus (CoVID-19), NAA - [facility] (08/10/2020 12:48 PM  EST)  Specimen  Throat swab (specimen)   2019 Novel Coronavirus (CoVID-19), NAA - [facility] (08/10/2020 12:48 PM EST)  Narrative  Franchot Mimes - 08/11/2020 1:35 PM EST  Performed at: [facility] [address] [place], [place] [id number] Lab Director: Rush Farmer MD, Phone: [phone]      Blood Pressure Record Sheet To take your blood pressure, you will need a blood pressure machine. You can buy a blood pressure machine (blood pressure monitor) at your clinic, drug store, or online. When choosing one, consider:  An automatic monitor that has an arm cuff.  A cuff that wraps snugly around your upper arm. You should be able to fit only one finger between your arm and the cuff.  A device that stores blood pressure reading results.  Do not choose a monitor that measures your blood pressure from your wrist or finger. Follow your health care provider's instructions for how to take your blood pressure. To use this form:  Get one reading in the morning (a.m.) before you take any medicines.  Get one reading in the evening (p.m.) before supper.  Take at least 2 readings with each blood pressure check. This  makes sure the results are correct. Wait 1-2 minutes between measurements.  Write down the results in the spaces on this form.  Repeat this once a week, or as told by your health care provider.  Make a follow-up appointment with your health care provider to discuss the results. Blood pressure log Date: _______________________  a.m. _____________________(1st reading) _____________________(2nd reading)  p.m. _____________________(1st reading) _____________________(2nd reading) Date: _______________________  a.m. _____________________(1st reading) _____________________(2nd reading)  p.m. _____________________(1st reading) _____________________(2nd reading) Date: _______________________  a.m. _____________________(1st reading) _____________________(2nd  reading)  p.m. _____________________(1st reading) _____________________(2nd reading) Date: _______________________  a.m. _____________________(1st reading) _____________________(2nd reading)  p.m. _____________________(1st reading) _____________________(2nd reading) Date: _______________________  a.m. _____________________(1st reading) _____________________(2nd reading)  p.m. _____________________(1st reading) _____________________(2nd reading) This information is not intended to replace advice given to you by your health care provider. Make sure you discuss any questions you have with your health care provider. Document Revised: 11/06/2019 Document Reviewed: 11/06/2019 Elsevier Patient Education  2021 [place].  Hypertension, Adult High blood pressure (hypertension) is when the force of blood pumping through the arteries is too strong. The arteries are the blood vessels that carry blood from the heart throughout the body. Hypertension forces the heart to work harder to pump blood and may cause arteries to become narrow or stiff. Untreated or uncontrolled hypertension can cause a heart attack, heart failure, a stroke, kidney disease, and other problems. A blood pressure reading consists of a higher number over a lower number. Ideally, your blood pressure should be below 120/80. The first ("top") number is called the systolic pressure. It is a measure of the pressure in your arteries as your heart beats. The second ("bottom") number is called the diastolic pressure. It is a measure of the pressure in your arteries as the heart relaxes. What are the causes? The exact cause of this condition is not known. There are some conditions that result in or are related to high blood pressure. What increases the risk? Some risk factors for high blood pressure are under your control. The following factors may make you more likely to develop this condition:  Smoking.  Having type 2 diabetes  mellitus, high cholesterol, or both.  Not getting enough exercise or physical activity.  Being overweight.  Having too much fat, sugar, calories, or salt (sodium) in your diet.  Drinking too much alcohol. Some risk factors for high blood pressure may be difficult or impossible to change. Some of these factors include:  Having chronic kidney disease.  Having a family history of high blood pressure.  Age. Risk increases with age.  Race. You may be at higher risk if you are African American.  Gender. Men are at higher risk than women before age 27. After age 79, women are at higher risk than men.  Having obstructive sleep apnea.  Stress. What are the signs or symptoms? High blood pressure may not cause symptoms. Very high blood pressure (hypertensive crisis) may cause:  Headache.  Anxiety.  Shortness of breath.  Nosebleed.  Nausea and vomiting.  Vision changes.  Severe chest pain.  Seizures. How is this diagnosed? This condition is diagnosed by measuring your blood pressure while you are seated, with your arm resting on a flat surface, your legs uncrossed, and your feet flat on the floor. The cuff of the blood pressure monitor will be placed directly against the skin of your upper arm at the level of your heart. It should be measured at least twice using the same arm. Certain conditions can cause  a difference in blood pressure between your right and left arms. Certain factors can cause blood pressure readings to be lower or higher than normal for a short period of time:  When your blood pressure is higher when you are in a health care provider's office than when you are at home, this is called white coat hypertension. Most people with this condition do not need medicines.  When your blood pressure is higher at home than when you are in a health care provider's office, this is called masked hypertension. Most people with this condition may need medicines to control blood  pressure. If you have a high blood pressure reading during one visit or you have normal blood pressure with other risk factors, you may be asked to:  Return on a different day to have your blood pressure checked again.  Monitor your blood pressure at home for 1 week or longer. If you are diagnosed with hypertension, you may have other blood or imaging tests to help your health care provider understand your overall risk for other conditions. How is this treated? This condition is treated by making healthy lifestyle changes, such as eating healthy foods, exercising more, and reducing your alcohol intake. Your health care provider may prescribe medicine if lifestyle changes are not enough to get your blood pressure under control, and if:  Your systolic blood pressure is above 130.  Your diastolic blood pressure is above 80. Your personal target blood pressure may vary depending on your medical conditions, your age, and other factors. Follow these instructions at home: Eating and drinking  Eat a diet that is high in fiber and potassium, and low in sodium, added sugar, and fat. An example eating plan is called the DASH (Dietary Approaches to Stop Hypertension) diet. To eat this way: ? Eat plenty of fresh fruits and vegetables. Try to fill one half of your plate at each meal with fruits and vegetables. ? Eat whole grains, such as whole-wheat pasta, brown rice, or whole-grain bread. Fill about one fourth of your plate with whole grains. ? Eat or drink low-fat dairy products, such as skim milk or low-fat yogurt. ? Avoid fatty cuts of meat, processed or cured meats, and poultry with skin. Fill about one fourth of your plate with lean proteins, such as fish, chicken without skin, beans, eggs, or tofu. ? Avoid pre-made and processed foods. These tend to be higher in sodium, added sugar, and fat.  Reduce your daily sodium intake. Most people with hypertension should eat less than 1,500 mg of sodium a  day.  Do not drink alcohol if: ? Your health care provider tells you not to drink. ? You are pregnant, may be pregnant, or are planning to become pregnant.  If you drink alcohol: ? Limit how much you use to:  0-1 drink a day for women.  0-2 drinks a day for men. ? Be aware of how much alcohol is in your drink. In the [place] one drink equals one 12 oz bottle of beer (355 mL), one 5 oz glass of wine (148 mL), or one 1 oz glass of hard liquor (44 mL).   Lifestyle  Work with your health care provider to maintain a healthy body weight or to lose weight. Ask what an ideal weight is for you.  Get at least 30 minutes of exercise most days of the week. Activities may include walking, swimming, or biking.  Include exercise to strengthen your muscles (resistance exercise), such as Pilates or lifting  weights, as part of your weekly exercise routine. Try to do these types of exercises for 30 minutes at least 3 days a week.  Do not use any products that contain nicotine or tobacco, such as cigarettes, e-cigarettes, and chewing tobacco. If you need help quitting, ask your health care provider.  Monitor your blood pressure at home as told by your health care provider.  Keep all follow-up visits as told by your health care provider. This is important.   Medicines  Take over-the-counter and prescription medicines only as told by your health care provider. Follow directions carefully. Blood pressure medicines must be taken as prescribed.  Do not skip doses of blood pressure medicine. Doing this puts you at risk for problems and can make the medicine less effective.  Ask your health care provider about side effects or reactions to medicines that you should watch for. Contact a health care provider if you:  Think you are having a reaction to a medicine you are taking.  Have headaches that keep coming back (recurring).  Feel dizzy.  Have swelling in your ankles.  Have trouble with your  vision. Get help right away if you:  Develop a severe headache or confusion.  Have unusual weakness or numbness.  Feel faint.  Have severe pain in your chest or abdomen.  Vomit repeatedly.  Have trouble breathing. Summary  Hypertension is when the force of blood pumping through your arteries is too strong. If this condition is not controlled, it may put you at risk for serious complications.  Your personal target blood pressure may vary depending on your medical conditions, your age, and other factors. For most people, a normal blood pressure is less than 120/80.  Hypertension is treated with lifestyle changes, medicines, or a combination of both. Lifestyle changes include losing weight, eating a healthy, low-sodium diet, exercising more, and limiting alcohol. This information is not intended to replace advice given to you by your health care provider. Make sure you discuss any questions you have with your health care provider. Document Revised: 03/28/2018 Document Reviewed: 03/28/2018 Elsevier Patient Education  2021 [place] With Less [employer] with less salt is one way to reduce the amount of sodium you get from food. Sodium is one of the elements that make up salt. It is found naturally in foods and is also added to certain foods. Depending on your condition and overall health, your health care provider or dietitian may recommend that you reduce your sodium intake. Most people should have less than 2,300 milligrams (mg) of sodium each day. If you have high blood pressure (hypertension), you may need to limit your sodium to 1,500 mg each day. Follow the tips below to help reduce your sodium intake. What are tips for eating less sodium? Reading food labels  Check the food label before buying or using packaged ingredients. Always check the label for the serving size and sodium content.  Look for products with no more than 140 mg of sodium in one serving.  Check  the % Daily Value column to see what percent of the daily recommended amount of sodium is provided in one serving of the product. Foods with 5% or less in this column are considered low in sodium. Foods with 20% or higher are considered high in sodium.  Do not choose foods with salt as one of the first three ingredients on the ingredients list. If salt is one of the first three ingredients, it usually means the item  is high in sodium.   Shopping  Buy sodium-free or low-sodium products. Look for the following words on food labels: ? Low-sodium. ? Sodium-free. ? Reduced-sodium. ? No salt added. ? Unsalted.  Always check the sodium content even if foods are labeled as low-sodium or no salt added.  Buy fresh foods. Cooking  Use herbs, seasonings without salt, and spices as substitutes for salt.  Use sodium-free baking soda when baking.  Grill, braise, or roast foods to add flavor with less salt.  Avoid adding salt to pasta, rice, or hot cereals.  Drain and rinse canned vegetables, beans, and meat before use.  Avoid adding salt when cooking sweets and desserts.  Cook with low-sodium ingredients. What foods are high in sodium? Vegetables Regular canned vegetables (not low-sodium or reduced-sodium). Sauerkraut, pickled vegetables, and relishes. Olives. [place] fries. Onion rings. Regular canned tomato sauce and paste. Regular tomato and vegetable juice. Frozen vegetables in sauces. Grains Instant hot cereals. Bread stuffing, pancake, and biscuit mixes. Croutons. Seasoned rice or pasta mixes. Noodle soup cups. Boxed or frozen macaroni and cheese. Regular salted crackers. Self-rising flour. Rolls. Bagels. Flour tortillas and wraps. Meats and other proteins Meat or fish that is salted, canned, smoked, cured, spiced, or pickled. This includes bacon, ham, sausages, hot dogs, corned beef, chipped beef, meat loaves, salt pork, jerky, pickled herring, anchovies, regular canned tuna, and sardines.  Salted nuts. Dairy Processed cheese and cheese spreads. Cheese curds. Blue cheese. Feta cheese. String cheese. Regular cottage cheese. Buttermilk. Canned milk. The items listed above may not be a complete list of foods high in sodium. Actual amounts of sodium may be different depending on processing. Contact a dietitian for more information. What foods are low in sodium? Fruits Fresh, frozen, or canned fruit with no sauce added. Fruit juice. Vegetables Fresh or frozen vegetables with no sauce added. "No salt added" canned vegetables. "No salt added" tomato sauce and paste. Low-sodium or reduced-sodium tomato and vegetable juice. Grains Noodles, pasta, quinoa, rice. Shredded or puffed wheat or puffed rice. Regular or quick oats (not instant). Low-sodium crackers. Low-sodium bread. Whole-grain bread and whole-grain pasta. Unsalted popcorn. Meats and other proteins Fresh or frozen whole meats, poultry (not injected with sodium), and fish with no sauce added. Unsalted nuts. Dried peas, beans, and lentils without added salt. Unsalted canned beans. Eggs. Unsalted nut butters. Low-sodium canned tuna or chicken. Dairy Milk. Soy milk. Yogurt. Low-sodium cheeses, such as Swiss, Monterey Jack, [place], and [employer]. Sherbet or ice cream (keep to  cup per serving). Cream cheese. Fats and oils Unsalted butter or margarine. Other foods Homemade pudding. Sodium-free baking soda and baking powder. Herbs and spices. Low-sodium seasoning mixes. Beverages Coffee and tea. Carbonated beverages. The items listed above may not be a complete list of foods low in sodium. Actual amounts of sodium may be different depending on processing. Contact a dietitian for more information. What are some salt alternatives when cooking? The following are herbs, seasonings, and spices that can be used instead of salt to flavor your food. Herbs should be fresh or dried. Do not choose packaged mixes. Next to the name of the herb,  spice, or seasoning are some examples of foods you can pair it with. Herbs  Bay leaves - Soups, meat and vegetable dishes, and spaghetti sauce.  Basil - [employer], soups, pasta, and fish dishes.  Cilantro - Meat, poultry, and vegetable dishes.  Chili powder - Marinades and Mexican dishes.  Chives - Salad dressings and potato dishes.  Cumin -  Mexican dishes, couscous, and meat dishes.  Dill - Fish dishes, sauces, and salads.  Fennel - Meat and vegetable dishes, breads, and cookies.  Garlic (do not use garlic salt) - [place] dishes, meat dishes, salad dressings, and sauces.  Marjoram - Soups, potato dishes, and meat dishes.  Oregano - Pizza and spaghetti sauce.  Parsley - Salads, soups, pasta, and meat dishes.  Rosemary - [place] dishes, salad dressings, soups, and red meats.  Saffron - Fish dishes, pasta, and some poultry dishes.  Sage - Stuffings and sauces.  Tarragon - Fish and [employer].  Thyme - Stuffing, meat, and fish dishes. Seasonings  Lemon juice - Fish dishes, poultry dishes, vegetables, and salads.  Vinegar - Salad dressings, vegetables, and fish dishes. Spices  Cinnamon - Sweet dishes, such as cakes, cookies, and puddings.  Cloves - Gingerbread, puddings, and marinades for meats.  Curry - Vegetable dishes, fish and poultry dishes, and stir-fry dishes.  Ginger - Vegetable dishes, fish dishes, and stir-fry dishes.  Nutmeg - Pasta, vegetables, poultry, fish dishes, and custard. Summary  Cooking with less salt is one way to reduce the amount of sodium that you get from food.  Buy sodium-free or low-sodium products.  Check the food label before using or buying packaged ingredients.  Use herbs, seasonings without salt, and spices as substitutes for salt in foods. This information is not intended to replace advice given to you by your health care provider. Make sure you discuss any questions you have with your health care provider. Document  Revised: 07/10/2019 Document Reviewed: 07/10/2019 Elsevier Patient Education  2021 [place] Eating Plan DASH stands for Dietary Approaches to Stop Hypertension. The DASH eating plan is a healthy eating plan that has been shown to:  Reduce high blood pressure (hypertension).  Reduce your risk for type 2 diabetes, heart disease, and stroke.  Help with weight loss. What are tips for following this plan? Reading food labels  Check food labels for the amount of salt (sodium) per serving. Choose foods with less than 5 percent of the Daily Value of sodium. Generally, foods with less than 300 milligrams (mg) of sodium per serving fit into this eating plan.  To find whole grains, look for the word "whole" as the first word in the ingredient list. Shopping  Buy products labeled as "low-sodium" or "no salt added."  Buy fresh foods. Avoid canned foods and pre-made or frozen meals. Cooking  Avoid adding salt when cooking. Use salt-free seasonings or herbs instead of table salt or sea salt. Check with your health care provider or pharmacist before using salt substitutes.  Do not fry foods. Cook foods using healthy methods such as baking, boiling, grilling, roasting, and broiling instead.  Cook with heart-healthy oils, such as olive, canola, avocado, soybean, or sunflower oil. Meal planning  Eat a balanced diet that includes: ? 4 or more servings of fruits and 4 or more servings of vegetables each day. Try to fill one-half of your plate with fruits and vegetables. ? 6-8 servings of whole grains each day. ? Less than 6 oz (170 g) of lean meat, poultry, or fish each day. A 3-oz (85-g) serving of meat is about the same size as a deck of cards. One egg equals 1 oz (28 g). ? 2-3 servings of low-fat dairy each day. One serving is 1 cup (237 mL). ? 1 serving of nuts, seeds, or beans 5 times each week. ? 2-3 servings of heart-healthy fats. Healthy fats called omega-3  fatty acids are found  in foods such as walnuts, flaxseeds, fortified milks, and eggs. These fats are also found in cold-water fish, such as sardines, salmon, and mackerel.  Limit how much you eat of: ? Canned or prepackaged foods. ? Food that is high in trans fat, such as some fried foods. ? Food that is high in saturated fat, such as fatty meat. ? Desserts and other sweets, sugary drinks, and other foods with added sugar. ? Full-fat dairy products.  Do not salt foods before eating.  Do not eat more than 4 egg yolks a week.  Try to eat at least 2 vegetarian meals a week.  Eat more home-cooked food and less restaurant, buffet, and fast food.   Lifestyle  When eating at a restaurant, ask that your food be prepared with less salt or no salt, if possible.  If you drink alcohol: ? Limit how much you use to:  0-1 drink a day for women who are not pregnant.  0-2 drinks a day for men. ? Be aware of how much alcohol is in your drink. In the [place] one drink equals one 12 oz bottle of beer (355 mL), one 5 oz glass of wine (148 mL), or one 1 oz glass of hard liquor (44 mL). General information  Avoid eating more than 2,300 mg of salt a day. If you have hypertension, you may need to reduce your sodium intake to 1,500 mg a day.  Work with your health care provider to maintain a healthy body weight or to lose weight. Ask what an ideal weight is for you.  Get at least 30 minutes of exercise that causes your heart to beat faster (aerobic exercise) most days of the week. Activities may include walking, swimming, or biking.  Work with your health care provider or dietitian to adjust your eating plan to your individual calorie needs. What foods should I eat? Fruits All fresh, dried, or frozen fruit. Canned fruit in natural juice (without added sugar). Vegetables Fresh or frozen vegetables (raw, steamed, roasted, or grilled). Low-sodium or reduced-sodium tomato and vegetable juice. Low-sodium or reduced-sodium tomato  sauce and tomato paste. Low-sodium or reduced-sodium canned vegetables. Grains Whole-grain or whole-wheat bread. Whole-grain or whole-wheat pasta. Brown rice. [place]. Bulgur. Whole-grain and low-sodium cereals. Pita bread. Low-fat, low-sodium crackers. Whole-wheat flour tortillas. Meats and other proteins Skinless chicken or [place]. Ground chicken or [place]. Pork with fat trimmed off. Fish and seafood. Egg whites. Dried beans, peas, or lentils. Unsalted nuts, nut butters, and seeds. Unsalted canned beans. Lean cuts of beef with fat trimmed off. Low-sodium, lean precooked or cured meat, such as sausages or meat loaves. Dairy Low-fat (1%) or fat-free (skim) milk. Reduced-fat, low-fat, or fat-free cheeses. Nonfat, low-sodium ricotta or cottage cheese. Low-fat or nonfat yogurt. Low-fat, low-sodium cheese. Fats and oils Soft margarine without trans fats. Vegetable oil. Reduced-fat, low-fat, or light mayonnaise and salad dressings (reduced-sodium). Canola, safflower, olive, avocado, soybean, and sunflower oils. Avocado. Seasonings and condiments Herbs. Spices. Seasoning mixes without salt. Other foods Unsalted popcorn and pretzels. Fat-free sweets. The items listed above may not be a complete list of foods and beverages you can eat. Contact a dietitian for more information. What foods should I avoid? Fruits Canned fruit in a light or heavy syrup. Fried fruit. Fruit in cream or butter sauce. Vegetables Creamed or fried vegetables. Vegetables in a cheese sauce. Regular canned vegetables (not low-sodium or reduced-sodium). Regular canned tomato sauce and paste (not low-sodium or reduced-sodium). Regular tomato  and vegetable juice (not low-sodium or reduced-sodium). Angie Fava. Olives. Grains Baked goods made with fat, such as croissants, muffins, or some breads. Dry pasta or rice meal packs. Meats and other proteins Fatty cuts of meat. Ribs. Fried meat. Berniece Salines. Bologna, salami, and other precooked  or cured meats, such as sausages or meat loaves. Fat from the back of a pig (fatback). Bratwurst. Salted nuts and seeds. Canned beans with added salt. Canned or smoked fish. Whole eggs or egg yolks. Chicken or [place] with skin. Dairy Whole or 2% milk, cream, and half-and-half. Whole or full-fat cream cheese. Whole-fat or sweetened yogurt. Full-fat cheese. Nondairy creamers. Whipped toppings. Processed cheese and cheese spreads. Fats and oils Butter. Stick margarine. Lard. Shortening. Ghee. Bacon fat. Tropical oils, such as coconut, palm kernel, or palm oil. Seasonings and condiments Onion salt, garlic salt, seasoned salt, table salt, and sea salt. Worcestershire sauce. Tartar sauce. Barbecue sauce. Teriyaki sauce. Soy sauce, including reduced-sodium. Steak sauce. Canned and packaged gravies. Fish sauce. Oyster sauce. Cocktail sauce. Store-bought horseradish. Ketchup. Mustard. Meat flavorings and tenderizers. Bouillon cubes. Hot sauces. Pre-made or packaged marinades. Pre-made or packaged taco seasonings. Relishes. Regular salad dressings. Other foods Salted popcorn and pretzels. The items listed above may not be a complete list of foods and beverages you should avoid. Contact a dietitian for more information. Where to find more information  National Heart, Lung, and Blood Institute: https://wilson-eaton.com/  American Heart Association: www.heart.org  Academy of Nutrition and Dietetics: www.eatright.[place]: www.kidney.org Summary  The DASH eating plan is a healthy eating plan that has been shown to reduce high blood pressure (hypertension). It may also reduce your risk for type 2 diabetes, heart disease, and stroke.  When on the DASH eating plan, aim to eat more fresh fruits and vegetables, whole grains, lean proteins, low-fat dairy, and heart-healthy fats.  With the DASH eating plan, you should limit salt (sodium) intake to 2,300 mg a day. If you have hypertension, you may  need to reduce your sodium intake to 1,500 mg a day.  Work with your health care provider or dietitian to adjust your eating plan to your individual calorie needs. This information is not intended to replace advice given to you by your health care provider. Make sure you discuss any questions you have with your health care provider. Document Revised: 06/21/2019 Document Reviewed: 06/21/2019 Elsevier Patient Education  2021 [place].  How to Take Your Blood Pressure Blood pressure is a measurement of how strongly your blood is pressing against the walls of your arteries. Arteries are blood vessels that carry blood from your heart throughout your body. Your health care provider takes your blood pressure at each office visit. You can also take your own blood pressure at home with a blood pressure monitor. You may need to take your own blood pressure to:  Confirm a diagnosis of high blood pressure (hypertension).  Monitor your blood pressure over time.  Make sure your blood pressure medicine is working. Supplies needed:  Blood pressure monitor.  Dining room chair to sit in.  Table or desk.  Small notebook and pencil or pen. How to prepare To get the most accurate reading, avoid the following for 30 minutes before you check your blood pressure:  Drinking caffeine.  Drinking alcohol.  Eating.  Smoking.  Exercising. Five minutes before you check your blood pressure:  Use the bathroom and urinate so that you have an empty bladder.  Sit quietly in a dining room chair. Do  not sit in a soft couch or an armchair. Do not talk. How to take your blood pressure To check your blood pressure, follow the instructions in the manual that came with your blood pressure monitor. If you have a digital blood pressure monitor, the instructions may be as follows: 1. Sit up straight in a chair. 2. Place your feet on the floor. Do not cross your ankles or legs. 3. Rest your left arm at the level  of your heart on a table or desk or on the arm of a chair. 4. Pull up your shirt sleeve. 5. Wrap the blood pressure cuff around the upper part of your left arm, 1 inch (2.5 cm) above your elbow. It is best to wrap the cuff around bare skin. 6. Fit the cuff snugly around your arm. You should be able to place only one finger between the cuff and your arm. 7. Position the cord so that it rests in the bend of your elbow. 8. Press the power button. 9. Sit quietly while the cuff inflates and deflates. 10. Read the digital reading on the monitor screen and write the numbers down (record them) in a notebook. 11. Wait 2-3 minutes, then repeat the steps, starting at step 1.   What does my blood pressure reading mean? A blood pressure reading consists of a higher number over a lower number. Ideally, your blood pressure should be below 120/80. The first ("top") number is called the systolic pressure. It is a measure of the pressure in your arteries as your heart beats. The second ("bottom") number is called the diastolic pressure. It is a measure of the pressure in your arteries as the heart relaxes. Blood pressure is classified into five stages. The following are the stages for adults who do not have a short-term serious illness or a chronic condition. Systolic pressure and diastolic pressure are measured in a unit called mm Hg (millimeters of mercury).  Normal  Systolic pressure: below 120.  Diastolic pressure: below 80. Elevated  Systolic pressure: 120-129.  Diastolic pressure: below 80. Hypertension stage 1  Systolic pressure: 130-139.  Diastolic pressure: 80-89. Hypertension stage 2  Systolic pressure: 140 or above.  Diastolic pressure: 90 or above. You can have elevated blood pressure or hypertension even if only the systolic or only the diastolic number in your reading is higher than normal. Follow these instructions at home:  Check your blood pressure as often as recommended by your  health care provider.  Check your blood pressure at the same time every day.  Take your monitor to the next appointment with your health care provider to make sure that: ? You are using it correctly. ? It provides accurate readings.  Be sure you understand what your goal blood pressure numbers are.  Tell your health care provider if you are having any side effects from blood pressure medicine.  Keep all follow-up visits as told by your health care provider. This is important. General tips  Your health care provider can suggest a reliable monitor that will meet your needs. There are several types of home blood pressure monitors.  Choose a monitor that has an arm cuff. Do not choose a monitor that measures your blood pressure from your wrist or finger.  Choose a cuff that wraps snugly around your upper arm. You should be able to fit only one finger between your arm and the cuff.  You can buy a blood pressure monitor at most drugstores or online. Where to find  more information American Heart Association: www.heart.org Contact a health care provider if:  Your blood pressure is consistently high. Get help right away if:  Your systolic blood pressure is higher than 180.  Your diastolic blood pressure is higher than 120. Summary  Blood pressure is a measurement of how strongly your blood is pressing against the walls of your arteries.  A blood pressure reading consists of a higher number over a lower number. Ideally, your blood pressure should be below 120/80.  Check your blood pressure at the same time every day.  Avoid caffeine, alcohol, smoking, and exercise for 30 minutes prior to checking your blood pressure. These agents can affect the accuracy of the blood pressure reading. This information is not intended to replace advice given to you by your health care provider. Make sure you discuss any questions you have with your health care provider. Document Revised: 07/12/2019  Document Reviewed: 07/12/2019 Elsevier Patient Education  2021 Elsevier Inc.  Amlodipine Tablets What is this medicine? AMLODIPINE (am LOE di peen) is a calcium channel blocker. It relaxes your blood vessels and decreases the amount of work the heart has to do. It treats high blood pressure and/or prevents chest pain (also called angina). This medicine may be used for other purposes; ask your health care provider or pharmacist if you have questions. COMMON BRAND NAME(S): Norvasc What should I tell my health care provider before I take this medicine? They need to know if you have any of these conditions:  heart disease  liver disease  an unusual or allergic reaction to amlodipine, other drugs, foods, dyes, or preservatives  pregnant or trying to get pregnant  breast-feeding How should I use this medicine? Take this medicine by mouth. Take it as directed on the prescription label at the same time every day. You can take it with or without food. If it upsets your stomach, take it with food. Keep taking it unless your health care provider tells you to stop. Talk to your health care provider about the use of this medicine in children. While it may be prescribed for children as young as 6 for selected conditions, precautions do apply. Overdosage: If you think you have taken too much of this medicine contact a poison control center or emergency room at once. NOTE: This medicine is only for you. Do not share this medicine with others. What if I miss a dose? If you miss a dose, take it as soon as you can. If it is almost time for your next dose, take only that dose. Do not take double or extra doses. What may interact with this medicine? This medicine may interact with the following medications:  clarithromycin  cyclosporine  diltiazem  itraconazole  simvastatin  tacrolimus This list may not describe all possible interactions. Give your health care provider a list of all the  medicines, herbs, non-prescription drugs, or dietary supplements you use. Also tell them if you smoke, drink alcohol, or use illegal drugs. Some items may interact with your medicine. What should I watch for while using this medicine? Visit your health care provider for regular checks on your progress. Check your blood pressure as directed. Ask your health care provider what your blood pressure should be. Also, find out when you should contact him or her. Do not treat yourself for coughs, colds, or pain while you are using this medicine without asking your health care provider for advice. Some medicines may increase your blood pressure. You may get drowsy or  dizzy. Do not drive, use machinery, or do anything that needs mental alertness until you know how this medicine affects you. Do not stand up or sit up quickly, especially if you are an older patient. This reduces the risk of dizzy or fainting spells. Alcohol can make you more drowsy and dizzy. Avoid alcoholic drinks. What side effects may I notice from receiving this medicine? Side effects that you should report to your doctor or health care provider as soon as possible:  allergic reactions (skin rash, itching or hives; swelling of the face, lips, or tongue)  heart attack (trouble breathing; pain or tightness in the chest, neck, back or arms; unusually weak or tired)  low blood pressure (dizziness; feeling faint or lightheaded, falls; unusually weak or tired) Side effects that usually do not require medical attention (report these to your doctor or health care provider if they continue or are bothersome):  facial flushing  nausea  palpitations  stomach pain  sudden weight gain  swelling of the ankles, feet, hands This list may not describe all possible side effects. Call your doctor for medical advice about side effects. You may report side effects to FDA at 1-800-FDA-1088. Where should I keep my medicine? Keep out of the reach of  children and pets. Store at room temperature between 20 and 25 degrees C (68 and 77 degrees F). Protect from light and moisture. Keep the container tightly closed. Get rid of any unused medicine after the expiration date. To get rid of medicines that are no longer needed or have expired:  Take the medicine to a medicine take-back program. Check with your pharmacy or law enforcement to find a location.  If you cannot return the medicine, check the label or package insert to see if the medicine should be thrown out in the garbage or flushed down the toilet. If you are not sure, ask your health care provider. If it is safe to put in the trash, empty the medicine out of the container. Mix the medicine with cat litter, dirt, coffee grounds, or other unwanted substance. Seal the mixture in a bag or container. Put it in the trash. NOTE: This sheet is a summary. It may not cover all possible information. If you have questions about this medicine, talk to your doctor, pharmacist, or health care provider.  2021 Elsevier/Gold Standard (2020-06-13 14:59:47)

## 2020-08-13 NOTE — Progress Notes (Signed)
Yes the clearance paperwork is in the sign basket for this patient.  Estus Krakowski,cma

## 2020-08-13 NOTE — Progress Notes (Signed)
Virtual Visit via Video Note  I connected with Arthur West  on 08/13/20 at  9:30 AM EST by a video enabled telemedicine application and verified that I am speaking with the correct person using two identifiers.  Location patient: home, [place] Location provider:work or home office Persons participating in the virtual visit: patient, provider  I discussed the limitations of evaluation and management by telemedicine and the availability of in person appointments. The patient expressed understanding and agreed to proceed.   HPI: 1.  H/a and dizziness with room spinning and elevated BP over years 138/93 unable to stand w/o falling over in the [facility] urgent care covid tested neg 08/10/20 and sent to the ED 08/10/20  ?dad HTN BP was elevated [facility] 153/94 Feeling 90% better   H/o chronic sinusitis meclizine helped improved 1.5 day he took 2 Q8 hours x 7 days  8-9 years ago txed ENT given nose sprays [facility] Wants to call back to ENT will call back given work schedule He has NS, flonase disc otc AH as well   08/10/20 covid negative [place]   3/3 pfizer utd  2. Dental work sch 08/24/20 [facility] in [place] needs labs from PCP does PCP have paperwork   ROS: See pertinent positives and negatives per HPI.  Past Medical History:  Diagnosis Date  . Arthritis    knees  . Colon cancer ([place])   . Headache    sinus headaches  . Sinus congestion    with pollen     Past Surgical History:  Procedure Laterality Date  . COLONOSCOPY  01/2017   [place] ta polyp   . EYE SURGERY     as child  . LAPAROSCOPIC RIGHT COLECTOMY     hx colono cancer     Current Outpatient Medications:  .  amLODipine (NORVASC) 2.5 MG tablet, Take 1 tablet (2.5 mg total) by mouth daily. In am, Disp: 90 tablet, Rfl: 3 .  Meclizine HCl 25 MG CHEW, Chew 1 tablet (25 mg total) by mouth every 8 (eight) hours as needed., Disp: 90 tablet, Rfl: 0 .  Multiple Vitamin (MULTIVITAMIN WITH MINERALS) TABS tablet, Take 1 tablet by  mouth daily., Disp: , Rfl:   EXAM:  VITALS per patient if applicable:  GENERAL: alert, oriented, appears well and in no acute distress  PSYCH/NEURO: pleasant and cooperative, no obvious depression or anxiety, speech and thought processing grossly intact  ASSESSMENT AND PLAN:  Discussed the following assessment and plan:  Hypertension, unspecified type - Plan: amLODipine (NORVASC) 2.5 MG tablet Monitor BP  Chronic sinusitis, unspecified location Acute intractable headache, unspecified headache type Dizziness could be HTN or vertigo/sinus related- Plan: Meclizine HCl 25 MG CHEW was taking 50 tid rec reduce to 25 tid prn only if needed  Declines CT scan for now  Monitor BP f/u with PCP 10/05/20  Consider ENT will call back for referral based on sch Feeling 90% better with meclizine alone   -we discussed possible serious and likely etiologies, options for evaluation and workup, limitations of telemedicine visit vs in person visit, treatment, treatment risks and precautions.     I discussed the assessment and treatment plan with the patient. The patient was provided an opportunity to ask questions and all were answered. The patient agreed with the plan and demonstrated an understanding of the instructions.    Time 20 min Delorise Jackson, MD

## 2020-08-14 ENCOUNTER — Ambulatory Visit: Payer: BC Managed Care – PPO | Admitting: Family Medicine

## 2020-08-14 NOTE — Addendum Note (Signed)
Addended by: Caryl Bis, ERIC G on: 08/14/2020 12:06 PM   Modules accepted: Orders

## 2020-08-14 NOTE — Telephone Encounter (Signed)
I have ordered labs for his preop form for his dentist.  There are no specific labs noted though I ordered basic screening labs.  He can be scheduled for lab work to be completed at least 14 days after his negative COVID test which occurred on 08/10/2020.  They are also asking for clearance for this and he would have to complete a visit in person for that.

## 2020-08-17 NOTE — Telephone Encounter (Signed)
LVM for the patient to call back tomorrow to schedule labs and a office visit. Mackenzy Grumbine,cma

## 2020-09-07 ENCOUNTER — Telehealth: Payer: Self-pay

## 2020-09-07 NOTE — Telephone Encounter (Signed)
Pt called and is going to have dental implant surgery at [facility] in [place]. He states that they sent a fax over to [place] today to explain what is needed by PCP for surgery. Pt would like a call back to clarify and schedule lab work.

## 2020-09-07 NOTE — Telephone Encounter (Signed)
[facility] is needing information sent to them for pt's up coming surgery. They are asking if we can fax EKG and recent blood work. She said they never received the medical clearance form back either.   Fax [phone]

## 2020-09-08 NOTE — Telephone Encounter (Signed)
I called the patient to schedule a lab appt  LVM.  Nina,cma

## 2020-09-08 NOTE — Telephone Encounter (Signed)
I called and spoke with a nurse at [facility] and I informed her that the patient needed an appointment because he does not have a current EKG, she stated someone will call me back with the date of his surgery.  Deven Audi,cma

## 2020-09-09 NOTE — Telephone Encounter (Signed)
Patient  back called and stated his surgery for dental is not until May and he has a scheduled appointment and he wants to do labs at that appointment.  Arthur West,cma

## 2020-09-09 NOTE — Telephone Encounter (Signed)
I called and LVM for the patient to call back to schedule a medical clearance appointment labs.  Walter Min,cma

## 2020-09-16 NOTE — Progress Notes (Incomplete)
[place]   Telephone:(336) [phone] Fax:(336) [phone]   Clinic Follow up Note   Patient Care Team: Leone Haven, MD as PCP - General (Family Medicine) Michael Boston, MD as Consulting Physician (General Surgery) Armbruster, Carlota Raspberry, MD as Consulting Physician (Gastroenterology)  Date of Service:  09/16/2020  CHIEF COMPLAINT: F/u for colon cancer  SUMMARY OF ONCOLOGIC HISTORY: Oncology History Overview Note  Noted on screening colonoscopy  Cancer of ascending colon s/p robotic colectomy 01/29/2016   Staging form: Colon and Rectum, AJCC 7th Edition     Pathologic stage from 01/19/2016: Stage IIA (T3, N0, cM0) - Signed by Truitt Merle, MD on 02/16/2016     Cancer of ascending colon s/p robotic colectomy 01/29/2016  01/07/2016 Procedure   COLONOSCOPY: large polypoid lesion in mid ascending colon -sessile and behind a fold and several polyps per Dr. Havery Moros   01/12/2016 Imaging   CT C/A/P: Colon wall thickening-ascending and right mesenteric node; small indeterminat liver lesions; #2 pulmonary nodules   01/15/2016 Imaging   MRI Liver: Liver cysts and a hemangioma   01/19/2016 Surgery   Robot assist proximal right colectomy per Dr. Johney Maine   01/29/2016 Initial Diagnosis   Cancer of ascending colon s/p robotic colectomy 01/29/2016   01/29/2016 Pathologic Stage   pT3, pN0, PMX--Grade 2   01/29/2016 Pathology Results   Adenocarcinoma, IHC normal; MSI stable   01/27/2017 Imaging   CT AP W Contrast 01/27/17 IMPRESSION: Interval right colectomy. No evidence of definitive recurrence or metastatic process.   02/27/2017 Procedure   COLONOSCOPY per Dr. Havery Moros - The perianal and digital rectal examinations were normal. - There was evidence of a prior end-to-end ileo-colonic anastomosis in the ascending colon. This was patent and was characterized by healthy appearing mucosa. - A diminutive polyp was found in the anastomosis. The polyp was sessile, I suspect reactive  / inflammatory polyp. The polyp was removed with a cold biopsy forceps. Resection and retrieval were complete. - The exam was otherwise without abnormality on direct and retroflexion views.   02/27/2017 Pathology Results   COLONOSCOPY per Dr. Havery Moros Surgical [P], ascending, polyp - TUBULAR ADENOMA. - NO HIGH GRADE DYSPLASIA OR MALIGNANCY.   02/27/2017 Pathology Results   02/27/2017 colonoscopy and pathology Diagnosis Surgical [P], ascending, polyp - TUBULAR ADENOMA. - NO HIGH GRADE DYSPLASIA OR MALIGNANCY   02/16/2018 Imaging   CT CAP W Contrast 02/16/18  IMPRESSION: 1. No findings to suggest metastatic disease in the chest, abdomen or pelvis. 2. Multiple stable low-attenuation liver lesions. The largest of these are compatible with simple cysts. The smaller lesions are too small to definitively characterize, but given their stability these are presumably benign lesions such as small hepatic cysts and/or biliary hamartomas. 3. Mild aortic atherosclerosis.  Aortic Atherosclerosis (ICD10-I70.0).    02/18/2019 Imaging   CT AP W Contrast  IMPRESSION: Stable exam. No evidence of recurrent or metastatic carcinoma within the abdomen or pelvis.      CURRENT THERAPY:  Surveillance  INTERVAL HISTORY: *** Arthur West is here for a follow up of colon cancer. He was last seen by me 1 year ago. She presents to the clinic alone.    REVIEW OF SYSTEMS:  *** Constitutional: Denies fevers, chills or abnormal weight loss Eyes: Denies blurriness of vision Ears, nose, mouth, throat, and face: Denies mucositis or sore throat Respiratory: Denies cough, dyspnea or wheezes Cardiovascular: Denies palpitation, chest discomfort or lower extremity swelling Gastrointestinal:  Denies nausea, heartburn or change in bowel habits Skin:  Denies abnormal skin rashes Lymphatics: Denies new lymphadenopathy or easy bruising Neurological:Denies numbness, tingling or new  weaknesses Behavioral/Psych: Mood is stable, no new changes  All other systems were reviewed with the patient and are negative.  MEDICAL HISTORY:  Past Medical History:  Diagnosis Date  . Arthritis    knees  . Colon cancer ([place])   . Headache    sinus headaches  . Sinus congestion    with pollen     SURGICAL HISTORY: Past Surgical History:  Procedure Laterality Date  . COLONOSCOPY  01/2017   Armbruster ta polyp   . EYE SURGERY     as child  . LAPAROSCOPIC RIGHT COLECTOMY     hx colono cancer    I have reviewed the social history and family history with the patient and they are unchanged from previous note.  ALLERGIES:  is allergic to hydrocodone and oxycodone.  MEDICATIONS:  Current Outpatient Medications  Medication Sig Dispense Refill  . amLODipine (NORVASC) 2.5 MG tablet Take 1 tablet (2.5 mg total) by mouth daily. In am 90 tablet 3  . Meclizine HCl 25 MG CHEW Chew 1 tablet (25 mg total) by mouth every 8 (eight) hours as needed. 90 tablet 0  . Multiple Vitamin (MULTIVITAMIN WITH MINERALS) TABS tablet Take 1 tablet by mouth daily.     No current facility-administered medications for this visit.    PHYSICAL EXAMINATION: ECOG PERFORMANCE STATUS: {CHL ONC ECOG PS:[id number]}  There were no vitals filed for this visit. There were no vitals filed for this visit. *** GENERAL:alert, no distress and comfortable SKIN: skin color, texture, turgor are normal, no rashes or significant lesions EYES: normal, Conjunctiva are pink and non-injected, sclera clear {OROPHARYNX:no exudate, no erythema and lips, buccal mucosa, and tongue normal}  NECK: supple, thyroid normal size, non-tender, without nodularity LYMPH:  no palpable lymphadenopathy in the cervical, axillary {or inguinal} LUNGS: clear to auscultation and percussion with normal breathing effort HEART: regular rate & rhythm and no murmurs and no lower extremity edema ABDOMEN:abdomen soft, non-tender and normal bowel  sounds Musculoskeletal:no cyanosis of digits and no clubbing  NEURO: alert & oriented x 3 with fluent speech, no focal motor/sensory deficits  LABORATORY DATA:  I have reviewed the data as listed CBC Latest Ref Rng & Units 09/20/2019 02/18/2019 08/24/2018  WBC 4.0 - 10.5 K/uL 6.2 5.7 5.5  Hemoglobin 13.0 - 17.0 g/dL 16.7 15.5 15.2  Hematocrit 39.0 - 52.0 % 52.1(H) 48.2 47.3  Platelets 150 - 400 K/uL 201 191 184     CMP Latest Ref Rng & Units 09/20/2019 02/18/2019 08/24/2018  Glucose 70 - 99 mg/dL 97 98 97  BUN 6 - 20 mg/dL _0 Creatinine 0.61 - 1.24 mg/dL 1.07 1.27(H) 1.29(H)  Sodium 135 - 145 mmol/L 140 143 144  Potassium 3.5 - 5.1 mmol/L 4.0 4.1 4.5  Chloride 98 - 111 mmol/L 105 107 107  CO2 22 - 32 mmol/L _1 Calcium 8.9 - 10.3 mg/dL 9.5 9.6 9.4  Total Protein 6.5 - 8.1 g/dL 7.8 7.6 7.4  Total Bilirubin 0.3 - 1.2 mg/dL 0.5 0.7 0.7  Alkaline Phos 38 - 126 U/L 75 65 72  AST 15 - 41 U/L _2 ALT 0 - 44 U/L _3 PROCEDURES  Colonoscopy by Dr Havery Moros 05/12/20 IMPRESSION - Patent end-to-end ileo-colonic anastomosis, characterized by a protruding staple which was removed with forceps. - The examined portion of the ileum was normal. -  Three 3 mm polyps in the ascending colon, removed with a cold snare. Resected and retrieved. - One 3 mm polyp at the recto-sigmoid colon, removed with a cold snare. Resected and retrieved. - One 3 mm polyp in the rectum, removed with a cold snare. Resected and retrieved. - Diverticulosis in the sigmoid colon. - Internal hemorrhoids. - The examination was otherwise normal.  Diagnosis Surgical [P], colon, rectum, rectosigmoid and ascending, polyp (5) - TUBULAR ADENOMA (4 OF 5 FRAGMENTS) - BENIGN COLONIC MUCOSA (1 OF 5 FRAGMENTS) - NO HIGH GRADE DYSPLASIA OR MALIGNANCY IDENTIFIED   RADIOGRAPHIC STUDIES: I have personally reviewed the radiological images as listed and agreed with the findings in the report. No results  found.   ASSESSMENT & PLAN:  Arthur West is a 60 y.o. male with   1. Cancer of ascending colon, invasive adenocarcinoma, grade 2, pT3N0M0, stage IIA, MSI-stable -He was diagnosed on 12/2015. He is s/p right colectomy.  -He had early-stage disease, node negative, no high risk features. His risk of recurrence is low to moderate. Adjuvant chemotherapy was not recommended -He is clinically doing well and stable. Lab reviewed, her CBC and CMP are within normal limits. His physical exam unremarkable. There is no clinical concern for recurrence. -He is over 3 years since diagnosis. His risk of recurrence has significantly decreased. I do not plan for more surveillance scan unless he has concerning symptoms. Will continue 5 years surveillance.  -Lab and f/u in 1 year. I encouraged him to f/u with PCP in interim to manage overall health.    2. Obesity  -I again encouraged him to eat healthy, exercise regularly, and try to lose some weight.  He is very physical activity at work. He agrees.   3. Hypertension  -He has had elevated BP in clinic in the last few visits. -Today repeated BP show 154/114 and 150/108 on each arms (09/20/19).  -I advised him to monitor BP at  home see his PCP next week for further evaluation and management as this can lead to chronic health issues. He understands and agrees.   Plan -He is clinically doing well.  -I personally called his PCP Dr. Caryl Bis and spoke with his RN, she will arrange his f/u appointment with them early next week   No problem-specific Assessment & Plan notes found for this encounter.   No orders of the defined types were placed in this encounter.  All questions were answered. The patient knows to call the clinic with any problems, questions or concerns. No barriers to learning was detected. The total time spent in the appointment was {CHL ONC TIME VISIT - IPNYO:[id number]}.     Joslyn Devon 09/16/2020   Oneal Deputy, am acting  as scribe for Truitt Merle, MD.   {Add scribe attestation statement}

## 2020-09-18 ENCOUNTER — Inpatient Hospital Stay: Payer: BC Managed Care – PPO

## 2020-09-18 ENCOUNTER — Inpatient Hospital Stay: Payer: BC Managed Care – PPO | Attending: Hematology | Admitting: Hematology

## 2020-09-30 ENCOUNTER — Ambulatory Visit: Payer: BC Managed Care – PPO | Admitting: Family Medicine

## 2020-10-05 ENCOUNTER — Ambulatory Visit: Payer: BC Managed Care – PPO | Admitting: Family Medicine

## 2020-10-13 ENCOUNTER — Telehealth: Payer: Self-pay | Admitting: Hematology

## 2020-10-13 NOTE — Telephone Encounter (Signed)
Scheduled appts per 3/14 sch msg. Pt aware.

## 2020-10-19 NOTE — Progress Notes (Signed)
[place]   Telephone:(336) [phone] Fax:(336) [phone]   Clinic Follow up Note   Patient Care Team: Leone Haven, MD as PCP - General (Family Medicine) Michael Boston, MD as Consulting Physician (General Surgery) Armbruster, Carlota Raspberry, MD as Consulting Physician (Gastroenterology)  Date of Service:  10/22/2020  CHIEF COMPLAINT: f/u for colon cancer  SUMMARY OF ONCOLOGIC HISTORY: Oncology History Overview Note  Noted on screening colonoscopy  Cancer of ascending colon s/p robotic colectomy 01/29/2016   Staging form: Colon and Rectum, AJCC 7th Edition     Pathologic stage from 01/19/2016: Stage IIA (T3, N0, cM0) - Signed by Truitt Merle, MD on 02/16/2016     Cancer of ascending colon s/p robotic colectomy 01/29/2016  01/07/2016 Procedure   COLONOSCOPY: large polypoid lesion in mid ascending colon -sessile and behind a fold and several polyps per Dr. Havery Moros   01/12/2016 Imaging   CT C/A/P: Colon wall thickening-ascending and right mesenteric node; small indeterminat liver lesions; #2 pulmonary nodules   01/15/2016 Imaging   MRI Liver: Liver cysts and a hemangioma   01/19/2016 Surgery   Robot assist proximal right colectomy per Dr. Johney Maine   01/29/2016 Initial Diagnosis   Cancer of ascending colon s/p robotic colectomy 01/29/2016   01/29/2016 Pathologic Stage   pT3, pN0, PMX--Grade 2   01/29/2016 Pathology Results   Adenocarcinoma, IHC normal; MSI stable   01/27/2017 Imaging   CT AP W Contrast 01/27/17 IMPRESSION: Interval right colectomy. No evidence of definitive recurrence or metastatic process.   02/27/2017 Procedure   COLONOSCOPY per Dr. Havery Moros - The perianal and digital rectal examinations were normal. - There was evidence of a prior end-to-end ileo-colonic anastomosis in the ascending colon. This was patent and was characterized by healthy appearing mucosa. - A diminutive polyp was found in the anastomosis. The polyp was sessile, I suspect reactive  / inflammatory polyp. The polyp was removed with a cold biopsy forceps. Resection and retrieval were complete. - The exam was otherwise without abnormality on direct and retroflexion views.   02/27/2017 Pathology Results   COLONOSCOPY per Dr. Havery Moros Surgical [P], ascending, polyp - TUBULAR ADENOMA. - NO HIGH GRADE DYSPLASIA OR MALIGNANCY.   02/27/2017 Pathology Results   02/27/2017 colonoscopy and pathology Diagnosis Surgical [P], ascending, polyp - TUBULAR ADENOMA. - NO HIGH GRADE DYSPLASIA OR MALIGNANCY   02/16/2018 Imaging   CT CAP W Contrast 02/16/18  IMPRESSION: 1. No findings to suggest metastatic disease in the chest, abdomen or pelvis. 2. Multiple stable low-attenuation liver lesions. The largest of these are compatible with simple cysts. The smaller lesions are too small to definitively characterize, but given their stability these are presumably benign lesions such as small hepatic cysts and/or biliary hamartomas. 3. Mild aortic atherosclerosis.  Aortic Atherosclerosis (ICD10-I70.0).    02/18/2019 Imaging   CT AP W Contrast  IMPRESSION: Stable exam. No evidence of recurrent or metastatic carcinoma within the abdomen or pelvis.   05/12/2020 Procedure    Colonoscopy by Dr Havery Moros 05/12/20 IMPRESSION - Patent end-to-end ileo-colonic anastomosis, characterized by a protruding staple which was removed with forceps. - The examined portion of the ileum was normal. - Three 3 mm polyps in the ascending colon, removed with a cold snare. Resected and retrieved. - One 3 mm polyp at the recto-sigmoid colon, removed with a cold snare. Resected and retrieved. - One 3 mm polyp in the rectum, removed with a cold snare. Resected and retrieved. - Diverticulosis in the sigmoid colon. - Internal hemorrhoids. - The  examination was otherwise normal.  Diagnosis Surgical [P], colon, rectum, rectosigmoid and ascending, polyp (5) - TUBULAR ADENOMA (4 OF 5 FRAGMENTS) -  BENIGN COLONIC MUCOSA (1 OF 5 FRAGMENTS) - NO HIGH GRADE DYSPLASIA OR MALIGNANCY IDENTIFIED      CURRENT THERAPY:  Surveillance  INTERVAL HISTORY:  Arthur West is here for a follow up of colon cancer. He was last seen by me 1 year ago. He presents to the clinic alone. He is doing well, he denies any pain, no GI symptoms. BM has been normal.  He has good appetite and energy level, weight has been stable.  All other systems were reviewed with the patient and are negative.  MEDICAL HISTORY:  Past Medical History:  Diagnosis Date  . Arthritis    knees  . Colon cancer ([place])   . Headache    sinus headaches  . Sinus congestion    with pollen     SURGICAL HISTORY: Past Surgical History:  Procedure Laterality Date  . COLONOSCOPY  01/2017   Armbruster ta polyp   . EYE SURGERY     as child  . LAPAROSCOPIC RIGHT COLECTOMY     hx colono cancer    I have reviewed the social history and family history with the patient and they are unchanged from previous note.  ALLERGIES:  is allergic to hydrocodone and oxycodone.  MEDICATIONS:  Current Outpatient Medications  Medication Sig Dispense Refill  . amLODipine (NORVASC) 2.5 MG tablet Take 1 tablet (2.5 mg total) by mouth daily. In am 90 tablet 3  . Meclizine HCl 25 MG CHEW Chew 1 tablet (25 mg total) by mouth every 8 (eight) hours as needed. 90 tablet 0  . Multiple Vitamin (MULTIVITAMIN WITH MINERALS) TABS tablet Take 1 tablet by mouth daily.     No current facility-administered medications for this visit.    PHYSICAL EXAMINATION: ECOG PERFORMANCE STATUS: 0 - Asymptomatic  Vitals:   10/22/20 0925  BP: (!) 160/96  Pulse: 74  Resp: 18  Temp: 97.8 F (36.6 C)  SpO2: 98%   Filed Weights   10/22/20 0925  Weight: 242 lb 1.6 oz (109.8 kg)    GENERAL:alert, no distress and comfortable SKIN: skin color, texture, turgor are normal, no rashes or significant lesions EYES: normal, Conjunctiva are pink and non-injected, sclera  clear NECK: supple, thyroid normal size, non-tender, without nodularity LYMPH:  no palpable lymphadenopathy in the cervical, axillary  LUNGS: clear to auscultation and percussion with normal breathing effort HEART: regular rate & rhythm and no murmurs and no lower extremity edema ABDOMEN:abdomen soft, non-tender and normal bowel sounds Musculoskeletal:no cyanosis of digits and no clubbing  NEURO: alert & oriented x 3 with fluent speech, no focal motor/sensory deficits  LABORATORY DATA:  I have reviewed the data as listed CBC Latest Ref Rng & Units 10/22/2020 09/20/2019 02/18/2019  WBC 4.0 - 10.5 K/uL 5.0 6.2 5.7  Hemoglobin 13.0 - 17.0 g/dL 15.7 16.7 15.5  Hematocrit 39.0 - 52.0 % 49.7 52.1(H) 48.2  Platelets 150 - 400 K/uL 211 201 191     CMP Latest Ref Rng & Units 10/22/2020 09/20/2019 02/18/2019  Glucose 70 - 99 mg/dL 101(H) 97 98  BUN 6 - 20 mg/dL _0 Creatinine 0.61 - 1.24 mg/dL 1.16 1.07 1.27(H)  Sodium 135 - 145 mmol/L 142 140 143  Potassium 3.5 - 5.1 mmol/L 4.1 4.0 4.1  Chloride 98 - 111 mmol/L 103 105 107  CO2 22 - 32 mmol/L 29 26 26  Calcium 8.9 - 10.3 mg/dL 9.4 9.5 9.6  Total Protein 6.5 - 8.1 g/dL 7.6 7.8 7.6  Total Bilirubin 0.3 - 1.2 mg/dL 0.5 0.5 0.7  Alkaline Phos 38 - 126 U/L 70 75 65  AST 15 - 41 U/L _0 ALT 0 - 44 U/L _1 RADIOGRAPHIC STUDIES: I have personally reviewed the radiological images as listed and agreed with the findings in the report. No results found.   ASSESSMENT & PLAN:  Arthur West is a 60 y.o. male with    1. Cancer of ascending colon, invasive adenocarcinoma, grade 2, pT3N0M0, stage IIA, MSI-stable -He was diagnosed on 12/2015. He is s/p right colectomy.  -He had early-stage disease, node negative, no high risk features. His risk of recurrence is low to moderate. Adjuvant chemotherapy was not recommended. He is currently on Surveillance  -He is clinically doing very well.  Lab reviewed, exam was unremarkable.   There is no clinical concern for recurrence. -He has been almost 5 years since his initial diagnosis, he has completed cancer surveillance.  I will see him as needed in future.   2. Obesity  -He has been counseled on eating healthy, exercising regularly, and trying to lose some weight.  He is very physical activity at work. He agrees.   3. Hypertension  -He has had elevated BP in clinic in the last few visits. -Continue to f/u with PCP for management   Plan -He is clinically doing well.  -He completed 5 years cancer surveillance, I will see him as needed in future.  No problem-specific Assessment & Plan notes found for this encounter.   No orders of the defined types were placed in this encounter.  All questions were answered. The patient knows to call the clinic with any problems, questions or concerns. No barriers to learning was detected. The total time spent in the appointment was 20 minutes.     Truitt Merle, MD 10/22/2020   I, Joslyn Devon, am acting as scribe for Truitt Merle, MD.   I have reviewed the above documentation for accuracy and completeness, and I agree with the above.

## 2020-10-22 ENCOUNTER — Other Ambulatory Visit: Payer: Self-pay

## 2020-10-22 ENCOUNTER — Inpatient Hospital Stay: Payer: BC Managed Care – PPO

## 2020-10-22 ENCOUNTER — Inpatient Hospital Stay: Payer: BC Managed Care – PPO | Attending: Hematology | Admitting: Hematology

## 2020-10-22 ENCOUNTER — Encounter: Payer: Self-pay | Admitting: Hematology

## 2020-10-22 VITALS — BP 160/96 | HR 74 | Temp 97.8°F | Resp 18 | Ht 72.0 in | Wt 242.1 lb

## 2020-10-22 DIAGNOSIS — C182 Malignant neoplasm of ascending colon: Secondary | ICD-10-CM

## 2020-10-22 DIAGNOSIS — I10 Essential (primary) hypertension: Secondary | ICD-10-CM | POA: Insufficient documentation

## 2020-10-22 DIAGNOSIS — E669 Obesity, unspecified: Secondary | ICD-10-CM | POA: Diagnosis not present

## 2020-10-22 DIAGNOSIS — Z85038 Personal history of other malignant neoplasm of large intestine: Secondary | ICD-10-CM | POA: Insufficient documentation

## 2020-10-22 LAB — CBC WITH DIFFERENTIAL/PLATELET
Abs Immature Granulocytes: 0.01 10*3/uL (ref 0.00–0.07)
Basophils Absolute: 0 10*3/uL (ref 0.0–0.1)
Basophils Relative: 0 %
Eosinophils Absolute: 0.1 10*3/uL (ref 0.0–0.5)
Eosinophils Relative: 3 %
HCT: 49.7 % (ref 39.0–52.0)
Hemoglobin: 15.7 g/dL (ref 13.0–17.0)
Immature Granulocytes: 0 %
Lymphocytes Relative: 34 %
Lymphs Abs: 1.7 10*3/uL (ref 0.7–4.0)
MCH: 25.4 pg — ABNORMAL LOW (ref 26.0–34.0)
MCHC: 31.6 g/dL (ref 30.0–36.0)
MCV: 80.6 fL (ref 80.0–100.0)
Monocytes Absolute: 0.3 10*3/uL (ref 0.1–1.0)
Monocytes Relative: 5 %
Neutro Abs: 2.9 10*3/uL (ref 1.7–7.7)
Neutrophils Relative %: 58 %
Platelets: 211 10*3/uL (ref 150–400)
RBC: 6.17 MIL/uL — ABNORMAL HIGH (ref 4.22–5.81)
RDW: 13.4 % (ref 11.5–15.5)
WBC: 5 10*3/uL (ref 4.0–10.5)
nRBC: 0 % (ref 0.0–0.2)

## 2020-10-22 LAB — COMPREHENSIVE METABOLIC PANEL
ALT: 20 U/L (ref 0–44)
AST: 17 U/L (ref 15–41)
Albumin: 4 g/dL (ref 3.5–5.0)
Alkaline Phosphatase: 70 U/L (ref 38–126)
Anion gap: 10 (ref 5–15)
BUN: 15 mg/dL (ref 6–20)
CO2: 29 mmol/L (ref 22–32)
Calcium: 9.4 mg/dL (ref 8.9–10.3)
Chloride: 103 mmol/L (ref 98–111)
Creatinine, Ser: 1.16 mg/dL (ref 0.61–1.24)
GFR, Estimated: >60 mL/min (ref >60)
Glucose, Bld: 101 mg/dL — ABNORMAL HIGH (ref 70–99)
Potassium: 4.1 mmol/L (ref 3.5–5.1)
Sodium: 142 mmol/L (ref 135–145)
Total Bilirubin: 0.5 mg/dL (ref 0.3–1.2)
Total Protein: 7.6 g/dL (ref 6.5–8.1)

## 2020-10-22 LAB — CEA (IN HOUSE-CHCC): CEA (CHCC-In House): 1.05 ng/mL (ref 0.00–5.00)

## 2020-10-23 ENCOUNTER — Other Ambulatory Visit: Payer: Self-pay

## 2020-10-23 ENCOUNTER — Ambulatory Visit (INDEPENDENT_AMBULATORY_CARE_PROVIDER_SITE_OTHER): Payer: BC Managed Care – PPO | Admitting: Family Medicine

## 2020-10-23 ENCOUNTER — Telehealth: Payer: Self-pay | Admitting: Hematology

## 2020-10-23 ENCOUNTER — Encounter: Payer: Self-pay | Admitting: Family Medicine

## 2020-10-23 VITALS — BP 140/90 | HR 75 | Temp 98.2°F | Ht 72.0 in | Wt 242.4 lb

## 2020-10-23 DIAGNOSIS — Z0000 Encounter for general adult medical examination without abnormal findings: Secondary | ICD-10-CM | POA: Insufficient documentation

## 2020-10-23 DIAGNOSIS — Z01818 Encounter for other preprocedural examination: Secondary | ICD-10-CM | POA: Diagnosis not present

## 2020-10-23 DIAGNOSIS — E669 Obesity, unspecified: Secondary | ICD-10-CM

## 2020-10-23 DIAGNOSIS — Z23 Encounter for immunization: Secondary | ICD-10-CM

## 2020-10-23 DIAGNOSIS — Z13220 Encounter for screening for lipoid disorders: Secondary | ICD-10-CM

## 2020-10-23 DIAGNOSIS — Z0001 Encounter for general adult medical examination with abnormal findings: Secondary | ICD-10-CM | POA: Diagnosis not present

## 2020-10-23 DIAGNOSIS — Z125 Encounter for screening for malignant neoplasm of prostate: Secondary | ICD-10-CM | POA: Diagnosis not present

## 2020-10-23 DIAGNOSIS — I10 Essential (primary) hypertension: Secondary | ICD-10-CM | POA: Diagnosis not present

## 2020-10-23 LAB — LIPID PANEL
Cholesterol: 239 mg/dL — ABNORMAL HIGH (ref 0–200)
HDL: 44.6 mg/dL (ref >39.00)
NonHDL: 194.1
Total CHOL/HDL Ratio: 5
Triglycerides: 238 mg/dL — ABNORMAL HIGH (ref 0.0–149.0)
VLDL: 47.6 mg/dL — ABNORMAL HIGH (ref 0.0–40.0)

## 2020-10-23 LAB — HEMOGLOBIN A1C: Hgb A1c MFr Bld: 5.8 % (ref 4.6–6.5)

## 2020-10-23 LAB — PSA: PSA: 1.21 ng/mL (ref 0.10–4.00)

## 2020-10-23 LAB — LDL CHOLESTEROL, DIRECT: Direct LDL: 148 mg/dL

## 2020-10-23 NOTE — Assessment & Plan Note (Signed)
Uncontrolled though the patient has not been taking his medication.  I encouraged him to restart his amlodipine.  We will see him back in a month.

## 2020-10-23 NOTE — Telephone Encounter (Signed)
Checked out appointment. No LOS notes needing to be scheduled. No changes made. 

## 2020-10-23 NOTE — Progress Notes (Signed)
Tommi Rumps, MD Phone: [phone]  Arthur West is a 60 y.o. male who presents today for CPE..  Diet: Generally healthy.  He does eat a lot of chips though.  Does have about a cup of sweet tea daily. Exercise: Does lift some weights and rides a bike 2 days a week.  He is active at work. Colonoscopy: Up-to-date 05/12/2020 with 3-year recall.  He follows with oncology though notes they just released him after 5 years of being cancer free. Prostate cancer screening: Due Family history-  Prostate cancer: No  Colon cancer: No Vaccines-   Tetanus: Up-to-date  Shingles: Due  COVID19: Up-to-date HIV screening: Up-to-date Hep C Screening: Up-to-date Tobacco use: No Alcohol use: Has 1 beer every other day Illicit Drug use: No Dentist: Yes Ophthalmology: No  The patient has not been taking his blood pressure medication.  The patient is due to have a dental implant put in and they needed an exam completed prior to having this done.    Active Ambulatory Problems    Diagnosis Date Noted  . Other fatigue 11/23/2015  . Lead exposure 11/23/2015  . Prediabetes 12/23/2015  . Osteoarthritis of both knees 12/23/2015  . Elevated blood pressure reading in office without diagnosis of hypertension 12/23/2015  . Cancer of ascending colon s/p robotic colectomy 01/29/2016 01/29/2016  . Chronic low back pain 02/21/2018  . History of colon cancer 03/11/2020  . Cervical radiculopathy 04/01/2020  . Hypertension 08/13/2020  . Vertigo 08/13/2020  . Chronic sinusitis 08/13/2020  . Encounter for general adult medical examination with abnormal findings 10/23/2020  . Preoperative examination 10/23/2020   Resolved Ambulatory Problems    Diagnosis Date Noted  . No Resolved Ambulatory Problems   Past Medical History:  Diagnosis Date  . Arthritis   . Colon cancer ([place])   . Headache   . Sinus congestion     Family History  Problem Relation Age of Onset  . Arthritis Other   . Breast cancer  Other   . Stroke Other   . Hypertension Other   . Heart attack Father 27  . Heart attack Paternal Grandfather 75  . Cancer Sister 31       breast cancer   . Cancer Maternal Aunt 70       ovarian cancer   . Cancer Maternal Aunt        colon polyps (4)  . Colon cancer Neg Hx   . Colon polyps Neg Hx   . Esophageal cancer Neg Hx   . Rectal cancer Neg Hx   . Stomach cancer Neg Hx     Social History   Socioeconomic History  . Marital status: Married    Spouse name: Jackelyn Poling  . Number of children: 3  . Years of education: Not on file  . Highest education level: Not on file  Occupational History  . Not on file  Tobacco Use  . Smoking status: Never Smoker  . Smokeless tobacco: Never Used  Vaping Use  . Vaping Use: Never used  Substance and Sexual Activity  . Alcohol use: Yes    Alcohol/week: 2.0 standard drinks    Types: 2 Standard drinks or equivalent per week    Comment: 2 beers weekly  . Drug use: No  . Sexual activity: Yes  Other Topics Concern  . 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 CV:  Negative for chest pain, dyspnea, edema, palpitations Resp: Negative for cough, dyspnea, hemoptysis GI: Negative for nausea, vomiting, diarrhea, constipation, abdominal pain, melena, hematochezia. GU: Negative for dysuria, incontinence, urinary hesitance, hematuria,  vaginal or penile discharge, polyuria, sexual difficulty, lumps in testicle or breasts MSK: Negative for muscle cramps or aches, joint pain or swelling Neuro: Negative for headaches, weakness, numbness, dizziness, passing out/fainting Psych: Negative for depression, anxiety, memory problems  Objective  Physical Exam Vitals:   10/23/20 1110 10/23/20 1153  BP: (!) 150/90 140/90  Pulse: 75   Temp: 98.2 F (36.8 C)   SpO2: 94%     BP Readings from Last 3 Encounters:  10/23/20 140/90  10/22/20 (!) 160/96  08/10/20 (!) 153/94   Wt Readings from Last 3 Encounters:  10/23/20 242 lb 6.4 oz (110 kg)  10/22/20 242 lb 1.6 oz (109.8 kg)  08/13/20 245 lb (111.1 kg)    Physical Exam Constitutional:      General: He is not in acute distress.    Appearance: He is not diaphoretic.  HENT:     Head: Normocephalic and atraumatic.  Eyes:     Conjunctiva/sclera: Conjunctivae normal.     Pupils: Pupils are equal, round, and reactive to light.  Cardiovascular:     Rate and Rhythm: Normal rate and regular rhythm.     Heart sounds: Normal heart sounds.  Pulmonary:     Effort: Pulmonary effort is normal.     Breath sounds: Normal breath sounds.  Abdominal:     General: Bowel sounds are normal. There is no distension.     Palpations: Abdomen is soft.     Tenderness: There is no abdominal tenderness. There is no guarding or rebound.  Musculoskeletal:     Right lower leg: No edema.     Left lower leg: No edema.  Skin:    General: Skin is warm and dry.  Neurological:     Mental Status: He is alert.  Psychiatric:        Mood and Affect: Mood normal.      Assessment/Plan:   Problem List Items Addressed This Visit    Encounter for general adult medical examination with abnormal findings    Physical exam completed.  I encouraged continued healthy diet.  Discussed cutting down on the chips and sweet tea.  Encouraged him to increase his physical activity.  Colonoscopy is up-to-date.  We  will give him his first Shingrix vaccine today.  PSA for prostate cancer screening.  Lab work as outlined below.      Hypertension    Uncontrolled though the patient has not been taking his medication.  I encouraged him to restart his amlodipine.  We will see him back in a month.      Preoperative examination    The patient is at low risk for cardiovascular event related to surgical intervention.  Will obtain lab work as outlined and then complete his clearance paperwork.  Given lack of symptoms  and history he does not require an EKG for his dental procedure.       Other Visit Diagnoses    Lipid screening    -  Primary   Relevant Orders   Lipid panel   Obesity (BMI 30.0-34.9)       Relevant Orders   HgB A1c   Prostate cancer screening       Relevant Orders   PSA   Need for shingles vaccine       Relevant Orders   Varicella-zoster vaccine subcutaneous (Completed)      This visit occurred during the SARS-CoV-2 public health emergency.  Safety protocols were in place, including screening questions prior to the visit, additional usage of staff PPE, and extensive cleaning of exam room while observing appropriate contact time as indicated for disinfecting solutions.    Tommi Rumps, MD [place]

## 2020-10-23 NOTE — Assessment & Plan Note (Signed)
The patient is at low risk for cardiovascular event related to surgical intervention.  Will obtain lab work as outlined and then complete his clearance paperwork.  Given lack of symptoms and history he does not require an EKG for his dental procedure.

## 2020-10-23 NOTE — Patient Instructions (Signed)
Nice to see you. We will get labs today.  Please restart your amlodipine.

## 2020-10-23 NOTE — Assessment & Plan Note (Signed)
Physical exam completed.  I encouraged continued healthy diet.  Discussed cutting down on the chips and sweet tea.  Encouraged him to increase his physical activity.  Colonoscopy is up-to-date.  We will give him his first Shingrix vaccine today.  PSA for prostate cancer screening.  Lab work as outlined below.

## 2020-10-27 ENCOUNTER — Telehealth: Payer: Self-pay

## 2020-10-27 DIAGNOSIS — E785 Hyperlipidemia, unspecified: Secondary | ICD-10-CM

## 2020-10-27 MED ORDER — ROSUVASTATIN CALCIUM 20 MG PO TABS: 20.0000 mg | ORAL_TABLET | Freq: Every day | ORAL | 0 refills | Status: DC

## 2020-10-27 NOTE — Telephone Encounter (Signed)
I ordered labs and medication for the patient per the provider.  Jasamine Pottinger,cma

## 2020-10-27 NOTE — Telephone Encounter (Signed)
-----   Message from Leone Haven, MD sent at 10/26/2020  2:52 PM EDT ----- Please let the patient know that his LDL cholesterol is above goal.  He meets criteria for a statin to help reduce his risk of stroke and heart attack.  Once you speak with him I can send in the Crestor 20 mg once daily.  He would need to have labs 6 weeks after starting this medication.  His A1c is in the prediabetic range.  He needs to work on diet and exercise for this so it does not progress to the diabetic range.  His other labs are acceptable.  Thanks.  The 10-year ASCVD risk score Mikey Bussing DC Brooke Bonito., et al., 2013) is: 16.4%   Values used to calculate the score:     Age: 1 years     Sex: Male     Is Non-Hispanic African American: Yes     Diabetic: No     Tobacco smoker: No     Systolic Blood Pressure: 656 mmHg     Is BP treated: Yes     HDL Cholesterol: 44.6 mg/dL     Total Cholesterol: 239 mg/dL

## 2020-10-27 NOTE — Addendum Note (Signed)
Addended by: Fulton Mole D on: 10/27/2020 04:25 PM   Modules accepted: Orders

## 2020-10-30 ENCOUNTER — Telehealth: Payer: Self-pay | Admitting: Family Medicine

## 2020-10-30 NOTE — Telephone Encounter (Signed)
Patient's dentist office called in about clearance for dental surgery she has faxed it 2 times and his surgery is 11/14/20 need fax sent back

## 2020-11-02 NOTE — Telephone Encounter (Signed)
Patient's dentist office called in about clearance for dental surgery she has faxed it 2 times and his surgery is 11/14/20 need fax sent back. Form is in the sign basket.  Aleigha Gilani,cma

## 2020-11-02 NOTE — Telephone Encounter (Signed)
I signed this last week.  It should have been faxed already.  I handed it to [facility] to fax.

## 2020-11-30 ENCOUNTER — Telehealth: Payer: Self-pay

## 2020-11-30 NOTE — Telephone Encounter (Signed)
err

## 2020-12-02 ENCOUNTER — Ambulatory Visit: Payer: BC Managed Care – PPO | Admitting: Family Medicine

## 2020-12-07 ENCOUNTER — Other Ambulatory Visit: Payer: BC Managed Care – PPO

## 2020-12-16 ENCOUNTER — Encounter: Payer: Self-pay | Admitting: Family Medicine

## 2020-12-16 ENCOUNTER — Ambulatory Visit (INDEPENDENT_AMBULATORY_CARE_PROVIDER_SITE_OTHER): Payer: BC Managed Care – PPO | Admitting: Family Medicine

## 2020-12-16 ENCOUNTER — Other Ambulatory Visit: Payer: Self-pay

## 2020-12-16 DIAGNOSIS — I10 Essential (primary) hypertension: Secondary | ICD-10-CM | POA: Diagnosis not present

## 2020-12-16 DIAGNOSIS — Z77011 Contact with and (suspected) exposure to lead: Secondary | ICD-10-CM

## 2020-12-16 DIAGNOSIS — E785 Hyperlipidemia, unspecified: Secondary | ICD-10-CM

## 2020-12-16 NOTE — Assessment & Plan Note (Signed)
Well-controlled.  He will continue amlodipine 2.5 mg once daily.  Follow-up in 6 months.

## 2020-12-16 NOTE — Assessment & Plan Note (Signed)
The patient reports he is tested monthly at work.

## 2020-12-16 NOTE — Patient Instructions (Signed)
Nice to see you. We will check some lab work today and contact you with results. Please continue your amlodipine and Crestor.

## 2020-12-16 NOTE — Assessment & Plan Note (Signed)
Continue Crestor 20 mg once daily.  Check lipid panel and hepatic function panel today.

## 2020-12-16 NOTE — Progress Notes (Signed)
Tommi Rumps, MD Phone: [phone]  Arthur West is a 60 y.o. male who presents today for f/u.  HYPERTENSION  Disease Monitoring  Home BP Monitoring similar to today Chest pain- no    Dyspnea- no Medications  Compliance-  Taking amlodipine.  Edema- no  HYPERLIPIDEMIA Symptoms Chest pain on exertion:  no    Medications: Compliance- taking crestor Right upper quadrant pain- no  Muscle aches- no      Social History   Tobacco Use  Smoking Status Never Smoker  Smokeless Tobacco Never Used    Current Outpatient Medications on File Prior to Visit  Medication Sig Dispense Refill  . amLODipine (NORVASC) 2.5 MG tablet Take 1 tablet (2.5 mg total) by mouth daily. In am 90 tablet 3  . amoxicillin (AMOXIL) 500 MG tablet Take 500 mg by mouth 3 (three) times daily.    . chlorhexidine (PERIDEX) 0.12 % solution SMARTSIG:By Mouth    . HYDROcodone-acetaminophen (NORCO) 10-325 MG tablet Take 1 tablet by mouth every 6 (six) hours as needed.    . Meclizine HCl 25 MG CHEW Chew 1 tablet (25 mg total) by mouth every 8 (eight) hours as needed. 90 tablet 0  . Multiple Vitamin (MULTIVITAMIN WITH MINERALS) TABS tablet Take 1 tablet by mouth daily.    . rosuvastatin (CRESTOR) 20 MG tablet Take 1 tablet (20 mg total) by mouth daily. 90 tablet 0  . promethazine (PHENERGAN) 25 MG tablet Take 25 mg by mouth as needed.     No current facility-administered medications on file prior to visit.     ROS see history of present illness  Objective  Physical Exam Vitals:   12/16/20 1444  BP: 120/78  Pulse: 66  Temp: 98 F (36.7 C)  SpO2: 97%    BP Readings from Last 3 Encounters:  12/16/20 120/78  10/23/20 140/90  10/22/20 (!) 160/96   Wt Readings from Last 3 Encounters:  12/16/20 239 lb (108.4 kg)  10/23/20 242 lb 6.4 oz (110 kg)  10/22/20 242 lb 1.6 oz (109.8 kg)    Physical Exam Constitutional:      General: He is not in acute distress.    Appearance: He is not diaphoretic.   Cardiovascular:     Rate and Rhythm: Normal rate and regular rhythm.     Heart sounds: Normal heart sounds.  Pulmonary:     Effort: Pulmonary effort is normal.     Breath sounds: Normal breath sounds.  Skin:    General: Skin is warm and dry.  Neurological:     Mental Status: He is alert.      Assessment/Plan: Please see individual problem list.  Problem List Items Addressed This Visit    Lead exposure    The patient reports he is tested monthly at work.      Hypertension    Well-controlled.  He will continue amlodipine 2.5 mg once daily.  Follow-up in 6 months.      Hyperlipidemia    Continue Crestor 20 mg once daily.  Check lipid panel and hepatic function panel today.      Relevant Orders   Lipid panel   Hepatic function panel      Return in about 6 months (around 06/18/2021) for Hypertension, hyperlipidemia.  This visit occurred during the SARS-CoV-2 public health emergency.  Safety protocols were in place, including screening questions prior to the visit, additional usage of staff PPE, and extensive cleaning of exam room while observing appropriate contact time as indicated for disinfecting  solutions.    Tommi Rumps, MD [place]

## 2020-12-17 LAB — HEPATIC FUNCTION PANEL
ALT: 19 U/L (ref 0–53)
AST: 19 U/L (ref 0–37)
Albumin: 4.2 g/dL (ref 3.5–5.2)
Alkaline Phosphatase: 69 U/L (ref 39–117)
Bilirubin, Direct: 0.1 mg/dL (ref 0.0–0.3)
Total Bilirubin: 0.7 mg/dL (ref 0.2–1.2)
Total Protein: 6.7 g/dL (ref 6.0–8.3)

## 2020-12-17 LAB — LIPID PANEL
Cholesterol: 138 mg/dL (ref 0–200)
HDL: 43.3 mg/dL (ref >39.00)
LDL Cholesterol: 70 mg/dL (ref 0–99)
NonHDL: 95.17
Total CHOL/HDL Ratio: 3
Triglycerides: 125 mg/dL (ref 0.0–149.0)
VLDL: 25 mg/dL (ref 0.0–40.0)

## 2021-01-25 ENCOUNTER — Other Ambulatory Visit: Payer: Self-pay | Admitting: Family Medicine

## 2021-01-25 DIAGNOSIS — E785 Hyperlipidemia, unspecified: Secondary | ICD-10-CM

## 2021-06-18 ENCOUNTER — Ambulatory Visit: Payer: BC Managed Care – PPO | Admitting: Family Medicine

## 2021-10-08 ENCOUNTER — Other Ambulatory Visit: Payer: Self-pay | Admitting: Internal Medicine

## 2021-10-08 DIAGNOSIS — I10 Essential (primary) hypertension: Secondary | ICD-10-CM

## 2021-10-30 ENCOUNTER — Other Ambulatory Visit: Payer: Self-pay | Admitting: Family Medicine

## 2021-10-30 DIAGNOSIS — E785 Hyperlipidemia, unspecified: Secondary | ICD-10-CM

## 2021-11-27 ENCOUNTER — Other Ambulatory Visit: Payer: Self-pay | Admitting: Family Medicine

## 2021-11-27 DIAGNOSIS — E785 Hyperlipidemia, unspecified: Secondary | ICD-10-CM

## 2021-12-12 ENCOUNTER — Other Ambulatory Visit: Payer: Self-pay | Admitting: Family Medicine

## 2021-12-12 DIAGNOSIS — E785 Hyperlipidemia, unspecified: Secondary | ICD-10-CM

## 2021-12-24 ENCOUNTER — Other Ambulatory Visit: Payer: Self-pay | Admitting: Family Medicine

## 2021-12-24 DIAGNOSIS — E785 Hyperlipidemia, unspecified: Secondary | ICD-10-CM

## 2022-01-19 ENCOUNTER — Other Ambulatory Visit: Payer: Self-pay | Admitting: Family Medicine

## 2022-01-19 DIAGNOSIS — E785 Hyperlipidemia, unspecified: Secondary | ICD-10-CM

## 2022-01-19 NOTE — Telephone Encounter (Signed)
LMTCB to schedule appointment for refills. Not seen since 12/16/20.

## 2022-01-19 NOTE — Telephone Encounter (Signed)
LVM for patienit to call back to schedule a follow up with the provider for refills.   He has not been seen since 2022.  Noemy Hallmon,cma

## 2022-03-04 ENCOUNTER — Telehealth: Payer: Self-pay | Admitting: Family Medicine

## 2022-03-04 NOTE — Telephone Encounter (Signed)
Patient called to schedule a physical and the only date available was 08/07 that date would not work.  Patient wife wants to know if he can be seen before 03/25/22 due his insurance running out.

## 2022-03-04 NOTE — Telephone Encounter (Signed)
I called the patients wife and the patient is scheduled for 8am on 8./22/2023 for his physical.  Myracle Febres,cma

## 2022-03-07 ENCOUNTER — Encounter: Payer: BC Managed Care – PPO | Admitting: Family Medicine

## 2022-03-22 ENCOUNTER — Ambulatory Visit (INDEPENDENT_AMBULATORY_CARE_PROVIDER_SITE_OTHER): Payer: BC Managed Care – PPO | Admitting: Family Medicine

## 2022-03-22 ENCOUNTER — Encounter: Payer: Self-pay | Admitting: Family Medicine

## 2022-03-22 VITALS — BP 130/70 | HR 56 | Temp 98.1°F | Ht 72.0 in | Wt 251.0 lb

## 2022-03-22 DIAGNOSIS — Z125 Encounter for screening for malignant neoplasm of prostate: Secondary | ICD-10-CM | POA: Diagnosis not present

## 2022-03-22 DIAGNOSIS — Z0000 Encounter for general adult medical examination without abnormal findings: Secondary | ICD-10-CM | POA: Diagnosis not present

## 2022-03-22 DIAGNOSIS — R7989 Other specified abnormal findings of blood chemistry: Secondary | ICD-10-CM | POA: Insufficient documentation

## 2022-03-22 DIAGNOSIS — Z23 Encounter for immunization: Secondary | ICD-10-CM

## 2022-03-22 DIAGNOSIS — E785 Hyperlipidemia, unspecified: Secondary | ICD-10-CM

## 2022-03-22 DIAGNOSIS — C182 Malignant neoplasm of ascending colon: Secondary | ICD-10-CM

## 2022-03-22 DIAGNOSIS — R7303 Prediabetes: Secondary | ICD-10-CM | POA: Diagnosis not present

## 2022-03-22 LAB — LIPID PANEL
Cholesterol: 172 mg/dL (ref 0–200)
HDL: 45.5 mg/dL (ref >39.00)
LDL Cholesterol: 105 mg/dL — ABNORMAL HIGH (ref 0–99)
NonHDL: 126.27
Total CHOL/HDL Ratio: 4
Triglycerides: 107 mg/dL (ref 0.0–149.0)
VLDL: 21.4 mg/dL (ref 0.0–40.0)

## 2022-03-22 LAB — COMPREHENSIVE METABOLIC PANEL
ALT: 18 U/L (ref 0–53)
AST: 17 U/L (ref 0–37)
Albumin: 4.2 g/dL (ref 3.5–5.2)
Alkaline Phosphatase: 67 U/L (ref 39–117)
BUN: 13 mg/dL (ref 6–23)
CO2: 27 mEq/L (ref 19–32)
Calcium: 9 mg/dL (ref 8.4–10.5)
Chloride: 104 mEq/L (ref 96–112)
Creatinine, Ser: 1.08 mg/dL (ref 0.40–1.50)
GFR: 74.32 mL/min (ref >60.00)
Glucose, Bld: 105 mg/dL — ABNORMAL HIGH (ref 70–99)
Potassium: 4.1 mEq/L (ref 3.5–5.1)
Sodium: 141 mEq/L (ref 135–145)
Total Bilirubin: 0.7 mg/dL (ref 0.2–1.2)
Total Protein: 6.7 g/dL (ref 6.0–8.3)

## 2022-03-22 LAB — PSA: PSA: 2.32 ng/mL (ref 0.10–4.00)

## 2022-03-22 LAB — HEMOGLOBIN A1C: Hgb A1c MFr Bld: 6.1 % (ref 4.6–6.5)

## 2022-03-22 NOTE — Patient Instructions (Signed)
Nice to see you. We will get lab work today and contact you with the results. Please see an eye doctor on a yearly basis. Please continue with healthy diet and try to start exercising as we discussed.

## 2022-03-22 NOTE — Assessment & Plan Note (Signed)
Note from oncology 2022 reviewed.  They released him as he had completed 5 years of cancer surveillance.  He will continue to have colonoscopies through GI.

## 2022-03-22 NOTE — Addendum Note (Signed)
Addended by: Fulton Mole D on: 03/22/2022 08:39 AM   Modules accepted: Orders

## 2022-03-22 NOTE — Progress Notes (Signed)
hysical

## 2022-03-22 NOTE — Progress Notes (Signed)
Arthur Rumps, MD Phone: [phone]  Arthur West is a 61 y.o. male who presents today for CPE.  Diet: generally healthy, some sweet tea, some snacks, generally eats plenty of vegetables and lean meats.  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 Colonoscopy: 05/12/2020 with 3-year recall, does have a history of colon cancer Prostate cancer screening: due Family history-  Prostate cancer: no  Colon cancer: no Vaccines-   Flu: due  Tetanus: yes  Shingles: due for second  COVID19: x3 HIV screening: UTD Hep C Screening: UTD Tobacco use: no Alcohol use: a few beers a week during the summer Illicit Drug use: no Dentist: yes Ophthalmology: no   Active Ambulatory Problems    Diagnosis Date Noted   Other fatigue 11/23/2015   Lead exposure 11/23/2015   Prediabetes 12/23/2015   Osteoarthritis of both knees 12/23/2015   Cancer of ascending colon s/p robotic colectomy 01/29/2016 01/29/2016   Chronic low back pain 02/21/2018   History of colon cancer 03/11/2020   Cervical radiculopathy 04/01/2020   Hypertension 08/13/2020   Vertigo 08/13/2020   Chronic sinusitis 08/13/2020   Routine general medical examination at a health care facility 10/23/2020   Hyperlipidemia 12/16/2020   Low testosterone 03/22/2022   Resolved Ambulatory Problems    Diagnosis Date Noted   Elevated blood pressure reading in office without diagnosis of hypertension 12/23/2015   Preoperative examination 10/23/2020   Past Medical History:  Diagnosis Date   Arthritis    Colon cancer ([place])    Headache    Sinus congestion     Family History  Problem Relation Age of Onset   Arthritis Other    Breast cancer Other    Stroke Other    Hypertension Other    Heart attack Father 42   Heart attack Paternal Grandfather 61   Cancer Sister 71       breast cancer    Cancer Maternal Aunt 70       ovarian cancer    Cancer Maternal Aunt        colon  polyps (4)   Colon cancer Neg Hx    Colon polyps Neg Hx    Esophageal cancer Neg Hx    Rectal cancer Neg Hx    Stomach cancer Neg Hx     Social History   Socioeconomic History   Marital status: Married    Spouse name: [name]   Number of children: 3   Years of education: Not on file   Highest education level: Not on file  Occupational History   Not on file  Tobacco Use   Smoking status: Never   Smokeless tobacco: Never  Vaping Use   Vaping Use: Never used  Substance and Sexual Activity   Alcohol use: Yes    Alcohol/week: 2.0 standard drinks of alcohol    Types: 2 Standard drinks or equivalent per week    Comment: 2 beers weekly   Drug use: No   Sexual activity: Yes  Other Topics Concern   Not on file  Social History Narrative   Fonnie Mu   #3 children ages 30+   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 CV:  Negative for chest pain, dyspnea, edema, palpitations Resp: Negative for cough, dyspnea, hemoptysis GI: Negative for nausea, vomiting, diarrhea, constipation, abdominal pain, melena, hematochezia. GU: Negative for dysuria, incontinence, urinary hesitance, hematuria, vaginal or penile discharge, polyuria, sexual difficulty, lumps in testicle or breasts MSK: Negative for muscle cramps or aches, joint pain or swelling Neuro: Negative for headaches, weakness, numbness, dizziness, passing out/fainting Psych: Negative for depression,  anxiety, memory problems  Objective  Physical Exam Vitals:   03/22/22 0813  BP: 130/70  Pulse: (!) 56  Temp: 98.1 F (36.7 C)  SpO2: 97%    BP Readings from Last 3 Encounters:  03/22/22 130/70  12/16/20 120/78  10/23/20 140/90   Wt Readings from Last 3 Encounters:  03/22/22 251 lb (113.9 kg)  12/16/20 239 lb (108.4 kg)  10/23/20 242 lb 6.4 oz (110 kg)    Physical Exam Constitutional:      General: He is not in acute distress.    Appearance: He is not diaphoretic.  HENT:     Head: Normocephalic and atraumatic.  Cardiovascular:     Rate and Rhythm: Normal rate and regular rhythm.     Heart sounds: Normal heart sounds.  Pulmonary:     Effort: Pulmonary effort is normal.     Breath sounds: Normal breath sounds.  Abdominal:     General: Bowel sounds are normal. There is no distension.     Palpations: Abdomen is soft.     Tenderness: There is no abdominal tenderness.  Musculoskeletal:     Right lower leg: No edema.     Left lower leg: No edema.  Lymphadenopathy:     Cervical: No cervical adenopathy.  Skin:    General: Skin is warm and dry.  Neurological:     Mental Status: He is alert.  Psychiatric:        Mood and Affect: Mood normal.      Assessment/Plan:   Problem List Items Addressed This Visit     Cancer of ascending colon s/p robotic colectomy 01/29/2016    Note from oncology 2022 reviewed.  They released him as he had completed 5 years of cancer surveillance.  He will continue to have colonoscopies through GI.      Hyperlipidemia   Relevant Orders   Comp Met (CMET)   Lipid panel   Prediabetes   Relevant Orders   HgB A1c   Routine general medical examination at a health care facility - Primary    Physical exam completed.  Encouraged healthy diet.  Discussed not overdoing snacks or sweets.  Discussed getting back into exercising when he retires tomorrow.  Prostate cancer screening to be completed with PSA.  Colon cancer screening is up-to-date.   Flu vaccine given today.  He will return for his Shingrix vaccine on a day where he does not have much due to to do the next day.  I encouraged him to see an ophthalmologist yearly.  Lab work as outlined.      Other Visit Diagnoses     Prostate cancer screening       Relevant Orders   PSA   Need for immunization against influenza       Relevant Orders   Flu Vaccine QUAD  67moIM (Fluarix, Fluzone & Alfiuria Quad PF) (Completed)       Return in about 1 week (around 03/29/2022) for shingrix, 1 year cpe.   ETommi Rumps MD [place]

## 2022-03-22 NOTE — Assessment & Plan Note (Signed)
Physical exam completed.  Encouraged healthy diet.  Discussed not overdoing snacks or sweets.  Discussed getting back into exercising when he retires tomorrow.  Prostate cancer screening to be completed with PSA.  Colon cancer screening is up-to-date.  Flu vaccine given today.  He will return for his Shingrix vaccine on a day where he does not have much due to to do the next day.  I encouraged him to see an ophthalmologist yearly.  Lab work as outlined.

## 2022-03-25 ENCOUNTER — Telehealth: Payer: Self-pay | Admitting: Family Medicine

## 2022-03-25 ENCOUNTER — Other Ambulatory Visit: Payer: Self-pay

## 2022-03-25 DIAGNOSIS — E785 Hyperlipidemia, unspecified: Secondary | ICD-10-CM

## 2022-03-25 MED ORDER — ROSUVASTATIN CALCIUM 40 MG PO TABS: 40.0000 mg | ORAL_TABLET | Freq: Every day | ORAL | 1 refills | Status: DC

## 2022-03-25 NOTE — Progress Notes (Signed)
ldl

## 2022-03-25 NOTE — Telephone Encounter (Signed)
Pt called stating he receive a text message about his labs have come in

## 2022-03-28 ENCOUNTER — Other Ambulatory Visit: Payer: Self-pay | Admitting: Family Medicine

## 2022-03-28 DIAGNOSIS — R972 Elevated prostate specific antigen [PSA]: Secondary | ICD-10-CM

## 2022-03-28 NOTE — Addendum Note (Signed)
Addended by: Leone Haven on: 03/28/2022 11:41 AM   Modules accepted: Orders

## 2022-03-29 ENCOUNTER — Ambulatory Visit: Payer: BC Managed Care – PPO

## 2022-04-05 ENCOUNTER — Encounter: Payer: Self-pay | Admitting: Urology

## 2022-04-05 ENCOUNTER — Ambulatory Visit: Payer: BC Managed Care – PPO | Admitting: Urology

## 2022-04-05 VITALS — BP 172/111 | HR 69 | Ht 72.0 in | Wt 251.0 lb

## 2022-04-05 DIAGNOSIS — R972 Elevated prostate specific antigen [PSA]: Secondary | ICD-10-CM

## 2022-04-05 LAB — URINALYSIS, COMPLETE
Bilirubin, UA: NEGATIVE
Glucose, UA: NEGATIVE
Ketones, UA: NEGATIVE
Leukocytes,UA: NEGATIVE
Nitrite, UA: NEGATIVE
Protein,UA: NEGATIVE
Specific Gravity, UA: 1.02 (ref 1.005–1.030)
Urobilinogen, Ur: 0.2 mg/dL (ref 0.2–1.0)
pH, UA: 5.5 (ref 5.0–7.5)

## 2022-04-05 LAB — MICROSCOPIC EXAMINATION: Bacteria, UA: NONE SEEN

## 2022-04-05 LAB — BLADDER SCAN AMB NON-IMAGING: Scan Result: 85

## 2022-04-05 NOTE — Progress Notes (Signed)
04/05/2022 10:03 AM   Arthur West January 08, 1961 [id number]  Referring provider: Leone Haven, MD [place],  [place] [address]  Chief Complaint  Patient presents with   Elevated PSA    HPI: 61 year old male referred for further evaluation of rising PSA.  He has minimal urinary symptoms.  PVR was 85 today.  He is unable to void and leave a sample.  He denies a family history of prostate cancer.  Component     Latest Ref Rng 10/23/2020 03/22/2022  PSA     0.10 - 4.00 ng/mL 1.21  2.32     IPSS     Row Name 04/05/22 0900         International Prostate Symptom Score   How often have you had the sensation of not emptying your bladder? Less than 1 in 5     How often have you had to urinate less than every two hours? Less than 1 in 5 times     How often have you found you stopped and started again several times when you urinated? Less than 1 in 5 times     How often have you found it difficult to postpone urination? Not at All     How often have you had a weak urinary stream? Not at All     How often have you had to strain to start urination? Not at All     How many times did you typically get up at night to urinate? 1 Time     Total IPSS Score 4       Quality of Life due to urinary symptoms   If you were to spend the rest of your life with your urinary condition just the way it is now how would you feel about that? Mixed              Score:  1-7 Mild 8-19 Moderate 20-35 Severe   PMH: Past Medical History:  Diagnosis Date   Arthritis    knees   Colon cancer ([place])    Headache    sinus headaches   Sinus congestion    with pollen     Surgical History: Past Surgical History:  Procedure Laterality Date   COLONOSCOPY  01/2017   [place] ta polyp    EYE SURGERY     as child   LAPAROSCOPIC RIGHT COLECTOMY     hx colono cancer    Home Medications:  Allergies as of 04/05/2022       Reactions   Hydrocodone Other (See  Comments)   Headache   Oxycodone Other (See Comments)   Headache        Medication List        Accurate as of April 05, 2022 10:03 AM. If you have any questions, ask your nurse or doctor.          amLODipine 2.5 MG tablet Commonly known as: NORVASC TAKE 1 TABLET (2.5 MG TOTAL) BY MOUTH DAILY. IN AM   Meclizine HCl 25 MG Chew Chew 1 tablet (25 mg total) by mouth every 8 (eight) hours as needed.   multivitamin with minerals Tabs tablet Take 1 tablet by mouth daily.   rosuvastatin 40 MG tablet Commonly known as: CRESTOR Take 1 tablet (40 mg total) by mouth daily.        Allergies:  Allergies  Allergen Reactions   Hydrocodone Other (See Comments)    Headache   Oxycodone Other (See Comments)  Headache    Family History: Family History  Problem Relation Age of Onset   Arthritis Other    Breast cancer Other    Stroke Other    Hypertension Other    Heart attack Father 28   Heart attack Paternal Grandfather 64   Cancer Sister 77       breast cancer    Cancer Maternal Aunt 70       ovarian cancer    Cancer Maternal Aunt        colon polyps (4)   Colon cancer Neg Hx    Colon polyps Neg Hx    Esophageal cancer Neg Hx    Rectal cancer Neg Hx    Stomach cancer Neg Hx     Social History:  reports that he has never smoked. He has never used smokeless tobacco. He reports current alcohol use of about 2.0 standard drinks of alcohol per week. He reports that he does not use drugs.   Physical Exam: BP (!) 172/111   Pulse 69   Ht 6' (1.829 m)   Wt 251 lb (113.9 kg)   BMI 34.04 kg/m   Constitutional:  Alert and oriented, No acute distress. HEENT: [place] AT, moist mucus membranes.  Trachea midline, no masses. Cardiovascular: No clubbing, cyanosis, or edema. Respiratory: Normal respiratory effort, no increased work of breathing. Rectal: Small prostate, nontender, no nodules.  Normal sphincter tone. Neurologic: Grossly intact, no focal deficits, moving all 4  extremities. Psychiatric: Normal mood and affect.  Laboratory Data: Lab Results  Component Value Date   WBC 5.0 10/22/2020   HGB 15.7 10/22/2020   HCT 49.7 10/22/2020   MCV 80.6 10/22/2020   PLT 211 10/22/2020    Lab Results  Component Value Date   CREATININE 1.08 03/22/2022    Lab Results  Component Value Date   PSA 2.32 03/22/2022   PSA 1.21 10/23/2020   PSA 0.79 11/23/2015    Lab Results  Component Value Date   HGBA1C 6.1 03/22/2022    Assessment & Plan:    1. Rising PSA level Urinalysis is pending to rule out infection   We reviewed the implications of an elevated PSA and the uncertainty surrounding it. In general, a man's PSA increases with age and is produced by both normal and cancerous prostate tissue. Differential for elevated PSA is BPH, prostate cancer, infection, recent intercourse/ejaculation, prostate infarction, recent urethroscopic manipulation (foley placement/cystoscopy) and prostatitis. Management of an elevated PSA can include observation or prostate biopsy and wediscussed this in detail. We discussed that indications for prostate biopsy are defined by age and race specific PSA cutoffs as well as a PSA velocity of 0.75/year.  Rectal exam today was reassuring  We will plan to repeat his PSA today, pending the results from either continue to follow this, consider MRI versus biopsy.  We will call him tomorrow with the recommendations.  He is agreeable this plan. - Urinalysis, Complete - PSA; Future - PSA - Bladder Scan (Post Void Residual) in office  Call with repeat PSA results and [facility], [place] [address], [place], [place] [address] [phone]

## 2022-04-06 ENCOUNTER — Telehealth: Payer: Self-pay | Admitting: *Deleted

## 2022-04-06 LAB — PSA: Prostate Specific Ag, Serum: 1.8 ng/mL (ref 0.0–4.0)

## 2022-04-06 NOTE — Telephone Encounter (Addendum)
Left patient a VM to return call with details  ----- Message from Hollice Espy, MD sent at 04/06/2022  8:30 AM EDT ----- PSA is back down to 1.8.  Recommend recheck again next year.  Please schedule visit with PSA prior.    Hollice Espy, MD

## 2022-05-10 ENCOUNTER — Other Ambulatory Visit: Payer: BC Managed Care – PPO

## 2022-12-24 ENCOUNTER — Other Ambulatory Visit: Payer: Self-pay | Admitting: Family Medicine

## 2022-12-24 DIAGNOSIS — E785 Hyperlipidemia, unspecified: Secondary | ICD-10-CM

## 2023-01-09 ENCOUNTER — Other Ambulatory Visit: Payer: Self-pay | Admitting: Family

## 2023-01-09 DIAGNOSIS — I10 Essential (primary) hypertension: Secondary | ICD-10-CM

## 2023-03-01 ENCOUNTER — Encounter (INDEPENDENT_AMBULATORY_CARE_PROVIDER_SITE_OTHER): Payer: Self-pay

## 2023-03-14 DIAGNOSIS — Z809 Family history of malignant neoplasm, unspecified: Secondary | ICD-10-CM | POA: Diagnosis not present

## 2023-03-14 DIAGNOSIS — Z7982 Long term (current) use of aspirin: Secondary | ICD-10-CM | POA: Diagnosis not present

## 2023-03-14 DIAGNOSIS — E785 Hyperlipidemia, unspecified: Secondary | ICD-10-CM | POA: Diagnosis not present

## 2023-03-14 DIAGNOSIS — Z85038 Personal history of other malignant neoplasm of large intestine: Secondary | ICD-10-CM | POA: Diagnosis not present

## 2023-03-14 DIAGNOSIS — Z6833 Body mass index (BMI) 33.0-33.9, adult: Secondary | ICD-10-CM | POA: Diagnosis not present

## 2023-03-14 DIAGNOSIS — E669 Obesity, unspecified: Secondary | ICD-10-CM | POA: Diagnosis not present

## 2023-03-14 DIAGNOSIS — F1421 Cocaine dependence, in remission: Secondary | ICD-10-CM | POA: Diagnosis not present

## 2023-03-14 DIAGNOSIS — I10 Essential (primary) hypertension: Secondary | ICD-10-CM | POA: Diagnosis not present

## 2023-03-24 ENCOUNTER — Encounter: Payer: Self-pay | Admitting: Family Medicine

## 2023-06-15 ENCOUNTER — Encounter: Payer: Self-pay | Admitting: Gastroenterology

## 2023-07-28 ENCOUNTER — Encounter: Payer: Self-pay | Admitting: Gastroenterology

## 2023-09-07 ENCOUNTER — Telehealth: Payer: Self-pay

## 2023-09-07 NOTE — Telephone Encounter (Signed)
 Attempted to reach patient multiple times for a pre-visit appointment which was scheduled for 0800.  VM obtained and voice mailbox was full, RN unable to leave a message.  Patient will need to contact [facility] by 5:00 pm today or his colonoscopy will be cancelled.  No Show letter mailed to patient.

## 2023-09-07 NOTE — Telephone Encounter (Signed)
 Patient did not reschedule his pre-visit, colonoscopy cancelled.

## 2023-09-13 ENCOUNTER — Ambulatory Visit (AMBULATORY_SURGERY_CENTER): Payer: Commercial Managed Care - PPO

## 2023-09-13 VITALS — Ht 72.0 in | Wt 235.0 lb

## 2023-09-13 DIAGNOSIS — Z85038 Personal history of other malignant neoplasm of large intestine: Secondary | ICD-10-CM

## 2023-09-13 MED ORDER — NA SULFATE-K SULFATE-MG SULF 17.5-3.13-1.6 GM/177ML PO SOLN: 1.0000 | Freq: Once | ORAL | 0 refills | Status: AC

## 2023-09-13 NOTE — Progress Notes (Signed)

## 2023-09-19 ENCOUNTER — Encounter: Payer: Self-pay | Admitting: Certified Registered Nurse Anesthetist

## 2023-09-19 ENCOUNTER — Encounter: Payer: Self-pay | Admitting: Gastroenterology

## 2023-09-21 ENCOUNTER — Encounter: Payer: Commercial Managed Care - PPO | Admitting: Gastroenterology

## 2023-09-21 ENCOUNTER — Telehealth: Payer: Self-pay | Admitting: Physician Assistant

## 2023-09-21 ENCOUNTER — Encounter: Payer: BC Managed Care – PPO | Admitting: Gastroenterology

## 2023-09-21 NOTE — Telephone Encounter (Signed)
 Pt called this am stating he needed to cancel his colonoscopy  for today and would like to reschedule  Can't remember who you nurse is  to attach

## 2023-09-21 NOTE — Telephone Encounter (Signed)
 New instructions sent to pt via MyChart for rescheduled appointment. Called pt and verified that pt can access his MyChart. He states he can. Asked pt to read instructions thoroughly and to call back if any questions. Pt verbalized understanding.

## 2023-09-21 NOTE — Telephone Encounter (Signed)
 Called pt to reschedule. He is looking at his work schedule and will call back later today. Gave direct number to admitting.

## 2023-09-21 NOTE — Telephone Encounter (Signed)
 Good morning Dr Adela Lank   Patient has rescheduled for next available procedure 3/5.

## 2023-09-21 NOTE — Telephone Encounter (Signed)
 Okay sorry to hear this.   Can the endo staff help contact this patient and reschedule him? Thanks

## 2023-10-04 ENCOUNTER — Ambulatory Visit: Payer: Commercial Managed Care - PPO | Admitting: Gastroenterology

## 2023-10-04 ENCOUNTER — Encounter: Payer: Self-pay | Admitting: Gastroenterology

## 2023-10-04 VITALS — BP 147/98 | HR 62 | Temp 98.6°F | Resp 15 | Ht 72.0 in | Wt 235.0 lb

## 2023-10-04 DIAGNOSIS — K621 Rectal polyp: Secondary | ICD-10-CM

## 2023-10-04 DIAGNOSIS — K5730 Diverticulosis of large intestine without perforation or abscess without bleeding: Secondary | ICD-10-CM | POA: Diagnosis not present

## 2023-10-04 DIAGNOSIS — D125 Benign neoplasm of sigmoid colon: Secondary | ICD-10-CM

## 2023-10-04 DIAGNOSIS — Z1211 Encounter for screening for malignant neoplasm of colon: Secondary | ICD-10-CM | POA: Diagnosis present

## 2023-10-04 DIAGNOSIS — D124 Benign neoplasm of descending colon: Secondary | ICD-10-CM

## 2023-10-04 DIAGNOSIS — K648 Other hemorrhoids: Secondary | ICD-10-CM

## 2023-10-04 DIAGNOSIS — Z980 Intestinal bypass and anastomosis status: Secondary | ICD-10-CM

## 2023-10-04 DIAGNOSIS — Z860101 Personal history of adenomatous and serrated colon polyps: Secondary | ICD-10-CM

## 2023-10-04 DIAGNOSIS — D127 Benign neoplasm of rectosigmoid junction: Secondary | ICD-10-CM

## 2023-10-04 DIAGNOSIS — D128 Benign neoplasm of rectum: Secondary | ICD-10-CM

## 2023-10-04 DIAGNOSIS — Z860100 Personal history of colon polyps, unspecified: Secondary | ICD-10-CM

## 2023-10-04 DIAGNOSIS — Z85038 Personal history of other malignant neoplasm of large intestine: Secondary | ICD-10-CM

## 2023-10-04 DIAGNOSIS — D123 Benign neoplasm of transverse colon: Secondary | ICD-10-CM

## 2023-10-04 MED ORDER — SODIUM CHLORIDE 0.9 % IV SOLN: 500.0000 mL | INTRAVENOUS | Status: DC

## 2023-10-04 NOTE — Progress Notes (Signed)
 Sedate, gd SR, tolerated procedure well, VSS, report to RN

## 2023-10-04 NOTE — Patient Instructions (Signed)
   Handouts on polyps,diverticulosis,& hemorrhoids given to you today.   Await pathology results on polyps removed   Continue previous diet & medications  YOU HAD AN ENDOSCOPIC PROCEDURE TODAY AT THE [facility]:   Refer to the procedure report that was given to you for any specific questions about what was found during the examination.  If the procedure report does not answer your questions, please call your gastroenterologist to clarify.  If you requested that your care partner not be given the details of your procedure findings, then the procedure report has been included in a sealed envelope for you to review at your convenience later.  YOU SHOULD EXPECT: Some feelings of bloating in the abdomen. Passage of more gas than usual.  Walking can help get rid of the air that was put into your GI tract during the procedure and reduce the bloating. If you had a lower endoscopy (such as a colonoscopy or flexible sigmoidoscopy) you may notice spotting of blood in your stool or on the toilet paper. If you underwent a bowel prep for your procedure, you may not have a normal bowel movement for a few days.  Please Note:  You might notice some irritation and congestion in your nose or some drainage.  This is from the oxygen used during your procedure.  There is no need for concern and it should clear up in a day or so.  SYMPTOMS TO REPORT IMMEDIATELY:  Following lower endoscopy (colonoscopy or flexible sigmoidoscopy):  Excessive amounts of blood in the stool  Significant tenderness or worsening of abdominal pains  Swelling of the abdomen that is new, acute  Fever of 100F or higher    For urgent or emergent issues, a gastroenterologist can be reached at any hour by calling [phone]. Do not use MyChart messaging for urgent concerns.    DIET:  We do recommend a small meal at first, but then you may proceed to your regular diet.  Drink plenty of fluids but you should avoid alcoholic  beverages for 24 hours.  ACTIVITY:  You should plan to take it easy for the rest of today and you should NOT DRIVE or use heavy machinery until tomorrow (because of the sedation medicines used during the test).    FOLLOW UP: Our staff will call the number listed on your records the next business day following your procedure.  We will call around 7:15- 8:00 am to check on you and address any questions or concerns that you may have regarding the information given to you following your procedure. If we do not reach you, we will leave a message.     If any biopsies were taken you will be contacted by phone or by letter within the next 1-3 weeks.  Please call us at [phone] if you have not heard about the biopsies in 3 weeks.    SIGNATURES/CONFIDENTIALITY: You and/or your care partner have signed paperwork which will be entered into your electronic medical record.  These signatures attest to the fact that that the information above on your After Visit Summary has been reviewed and is understood.  Full responsibility of the confidentiality of this discharge information lies with you and/or your care-partner.

## 2023-10-04 NOTE — Progress Notes (Signed)
 Called to room to assist during endoscopic procedure.  Patient ID and intended procedure confirmed with present staff. Received instructions for my participation in the procedure from the performing physician.

## 2023-10-04 NOTE — Progress Notes (Signed)
 [facility] History and Physical   Primary Care Physician:  Arthur Luis, MD (Inactive)   Reason for Procedure:   History of colon cancer, history of colon polyps  Plan:    colonoscopy     HPI: Arthur West is a 63 y.o. male  here for colonoscopy surveillance - colon cancer dx age 63, 4 adenomas removed 05/2020.   Marland Kitchen Patient denies any bowel symptoms at this time. Otherwise feels well without any cardiopulmonary symptoms.   I have discussed risks / benefits of anesthesia and endoscopic procedure with Arthur West and they wish to proceed with the exams as outlined today.    Past Medical History:  Diagnosis Date   Arthritis    knees   Colon cancer ([facility])    Headache    sinus headaches   Hyperlipidemia    Sinus congestion    with pollen     Past Surgical History:  Procedure Laterality Date   COLONOSCOPY  01/2017   Arthur West ta polyp    EYE SURGERY     as child   LAPAROSCOPIC RIGHT COLECTOMY     hx colono cancer    Prior to Admission medications   Medication Sig Start Date End Date Taking? Authorizing Provider  aspirin EC 81 MG tablet Take 81 mg by mouth daily. Swallow whole.   Yes [provider]  Multiple Vitamin (MULTIVITAMIN WITH MINERALS) TABS tablet Take 1 tablet by mouth daily.   Yes [provider]  amLODipine (NORVASC) 2.5 MG tablet TAKE 1 TABLET (2.5 MG TOTAL) BY MOUTH DAILY. IN AM Patient not taking: Reported on 10/04/2023 10/08/21   Arthur Foster, FNP  Meclizine HCl 25 MG CHEW Chew 1 tablet (25 mg total) by mouth every 8 (eight) hours as needed. Patient not taking: Reported on 09/13/2023 08/13/20   McLean-Scocuzza, Arthur Spillers, MD  rosuvastatin (CRESTOR) 40 MG tablet TAKE 1 TABLET BY MOUTH EVERY DAY Patient not taking: Reported on 10/04/2023 12/27/22   Arthur Luis, MD    Current Outpatient Medications  Medication Sig Dispense Refill   aspirin EC 81 MG tablet Take 81 mg by mouth daily. Swallow whole.     Multiple  Vitamin (MULTIVITAMIN WITH MINERALS) TABS tablet Take 1 tablet by mouth daily.     amLODipine (NORVASC) 2.5 MG tablet TAKE 1 TABLET (2.5 MG TOTAL) BY MOUTH DAILY. IN AM (Patient not taking: Reported on 10/04/2023) 90 tablet 3   Meclizine HCl 25 MG CHEW Chew 1 tablet (25 mg total) by mouth every 8 (eight) hours as needed. (Patient not taking: Reported on 09/13/2023) 90 tablet 0   rosuvastatin (CRESTOR) 40 MG tablet TAKE 1 TABLET BY MOUTH EVERY DAY (Patient not taking: Reported on 10/04/2023) 90 tablet 1   Current Facility-Administered Medications  Medication Dose Route Frequency Provider Last Rate Last Admin   0.9 %  sodium chloride infusion  500 mL Intravenous Continuous Arthur West, Willaim Rayas, MD        Allergies as of 10/04/2023 - Review Complete 10/04/2023  Allergen Reaction Noted   Hydrocodone Other (See Comments) 02/16/2016   Oxycodone Other (See Comments) 02/16/2016    Family History  Problem Relation Age of Onset   Arthritis Other    Breast cancer Other    Stroke Other    Hypertension Other    Heart attack Father 84   Heart attack Paternal Grandfather 68   Cancer Sister 50       breast cancer    Cancer Maternal Aunt  70       ovarian cancer    Cancer Maternal Aunt        colon polyps (4)   Colon cancer Neg Hx    Colon polyps Neg Hx    Esophageal cancer Neg Hx    Rectal cancer Neg Hx    Stomach cancer Neg Hx     Social History   Socioeconomic History   Marital status: Married    Spouse name: Arthur West   Number of children: 3   Years of education: Not on file   Highest education level: Not on file  Occupational History   Not on file  Tobacco Use   Smoking status: Never   Smokeless tobacco: Never  Vaping Use   Vaping status: Never Used  Substance and Sexual Activity   Alcohol use: Yes    Alcohol/week: 2.0 standard drinks of alcohol    Types: 2 Standard drinks or equivalent per week    Comment: 2 beers weekly   Drug use: No   Sexual activity: Yes  Other Topics  Concern   Not on file  Social History Narrative   Arthur West   #3 children ages 5+   Employed as Press photographer in [employer]   Social Drivers of Health   Financial Resource Strain: Not on file  Food Insecurity: Not on file  Transportation Needs: Not on file  Physical Activity: Not on file  Stress: Not on file  Social Connections: Not on file  Intimate Partner Violence: Not on file    Review of Systems: All other review of systems negative except as mentioned in the HPI.  Physical Exam: Vital signs BP (!) 132/98   Pulse 60   Temp 98.6 F (37 C)   Resp 14   Ht 6' (1.829 m)   Wt 235 lb (106.6 kg)   SpO2 98%   BMI 31.87 kg/m   General:   Alert,  Well-developed, pleasant and cooperative in NAD Lungs:  Clear throughout to auscultation.   Heart:  Regular rate and rhythm Abdomen:  Soft, nontender and nondistended.   Neuro/Psych:  Alert and cooperative. Normal mood and affect. A and O x 3  Arthur Rain, MD [facility]

## 2023-10-04 NOTE — Progress Notes (Signed)
 Pt's states no medical or surgical changes since previsit or office visit.

## 2023-10-04 NOTE — Op Note (Signed)
 [facility] Patient Name: Arthur West Procedure Date: 10/04/2023 3:35 PM MRN: [id number] Endoscopist: Viviann Spare P. Adela Lank , MD, [id number] Age: 63 Referring MD:  Date of Birth: 31-May-1961 Gender: Male Account #: [id number] Procedure:                Colonoscopy Indications:              High risk colon cancer surveillance: Personal                            history of colonic polyps, Personal history of                            colon cancer - ascending colon cancer dx in 2017,                            last exam 05/2020 - 4 adenomas removed Medicines:                Monitored Anesthesia Care Procedure:                Pre-Anesthesia Assessment:                           - Prior to the procedure, a History and Physical                            was performed, and patient medications and                            allergies were reviewed. The patient's tolerance of                            previous anesthesia was also reviewed. The risks                            and benefits of the procedure and the sedation                            options and risks were discussed with the patient.                            All questions were answered, and informed consent                            was obtained. Prior Anticoagulants: The patient has                            taken no anticoagulant or antiplatelet agents. ASA                            Grade Assessment: II - A patient with mild systemic                            disease. After reviewing the risks and benefits,  the patient was deemed in satisfactory condition to                            undergo the procedure.                           After obtaining informed consent, the colonoscope                            was passed under direct vision. Throughout the                            procedure, the patient's blood pressure, pulse, and                            oxygen saturations were  monitored continuously. The                            CF [id number] #[id number] was introduced through the anus                            and advanced to the the ileocolonic anastomosis.                            The colonoscopy was performed without difficulty.                            The patient tolerated the procedure well. The                            quality of the bowel preparation was good. The                            ileocecal valve, appendiceal orifice, and rectum                            were photographed. Scope In: 3:46:17 PM Scope Out: 4:09:07 PM Scope Withdrawal Time: 0 hours 19 minutes 58 seconds  Total Procedure Duration: 0 hours 22 minutes 50 seconds  Findings:                 The perianal and digital rectal examinations were                            normal.                           There was evidence of a prior end-to-side                            ileo-colonic anastomosis in the distal ascending                            colon. This was patent and was characterized by  healthy appearing mucosa.                           Two sessile polyps were found in the transverse                            colon. The polyps were 3 to 4 mm in size. These                            polyps were removed with a cold snare. Resection                            and retrieval were complete.                           A diminutive polyp was found in the descending                            colon. The polyp was sessile. The polyp was removed                            with a cold snare. Resection and retrieval were                            complete.                           A 3 mm polyp was found in the sigmoid colon. The                            polyp was sessile. The polyp was removed with a                            cold snare. Resection and retrieval were complete.                           A few small-mouthed diverticula were found in the                             sigmoid colon.                           A 3 to 4 mm polyp was found in the recto-sigmoid                            colon. The polyp was sessile. The polyp was removed                            with a cold snare. Resection and retrieval were                            complete.  A 3 mm polyp was found in the rectum. The polyp was                            sessile. The polyp was removed with a cold snare.                            Resection and retrieval were complete.                           Internal hemorrhoids were found during retroflexion.                           The left colon had poor air retention which                            prolonged the exam. The exam was otherwise without                            abnormality. Complications:            No immediate complications. Estimated blood loss:                            Minimal. Estimated Blood Loss:     Estimated blood loss was minimal. Impression:               - Patent end-to-side ileo-colonic anastomosis,                            characterized by healthy appearing mucosa.                           - Two 3 to 4 mm polyps in the transverse colon,                            removed with a cold snare. Resected and retrieved.                           - One diminutive polyp in the descending colon,                            removed with a cold snare. Resected and retrieved.                           - One 3 mm polyp in the sigmoid colon, removed with                            a cold snare. Resected and retrieved.                           - Diverticulosis in the sigmoid colon.                           - One 3 to 4 mm polyp at the recto-sigmoid colon,  removed with a cold snare. Resected and retrieved.                           - One 3 mm polyp in the rectum, removed with a cold                            snare. Resected and retrieved.                            - Internal hemorrhoids.                           - The examination was otherwise normal. Recommendation:           - Patient has a contact number available for                            emergencies. The signs and symptoms of potential                            delayed complications were discussed with the                            patient. Return to normal activities tomorrow.                            Written discharge instructions were provided to the                            patient.                           - Resume previous diet.                           - Continue present medications.                           - Await pathology results. Viviann Spare P. Terrika Zuver, MD 10/04/2023 4:19:14 PM This report has been signed electronically.

## 2023-10-05 ENCOUNTER — Telehealth: Payer: Self-pay

## 2023-10-05 NOTE — Telephone Encounter (Signed)
 Follow up call to pt, no answer.

## 2023-10-05 NOTE — Telephone Encounter (Signed)
 Opened in error

## 2023-10-06 ENCOUNTER — Encounter: Payer: Self-pay | Admitting: Physician Assistant

## 2023-10-06 ENCOUNTER — Ambulatory Visit: Admitting: Physician Assistant

## 2023-10-06 VITALS — BP 161/103 | HR 69 | Ht 72.0 in | Wt 244.0 lb

## 2023-10-06 DIAGNOSIS — R361 Hematospermia: Secondary | ICD-10-CM

## 2023-10-06 DIAGNOSIS — R972 Elevated prostate specific antigen [PSA]: Secondary | ICD-10-CM

## 2023-10-06 LAB — MICROSCOPIC EXAMINATION

## 2023-10-06 LAB — URINALYSIS, COMPLETE
Bilirubin, UA: NEGATIVE
Glucose, UA: NEGATIVE
Ketones, UA: NEGATIVE
Leukocytes,UA: NEGATIVE
Nitrite, UA: NEGATIVE
Protein,UA: NEGATIVE
Specific Gravity, UA: 1.025 (ref 1.005–1.030)
Urobilinogen, Ur: 0.2 mg/dL (ref 0.2–1.0)
pH, UA: 5.5 (ref 5.0–7.5)

## 2023-10-06 LAB — BLADDER SCAN AMB NON-IMAGING

## 2023-10-06 NOTE — Progress Notes (Signed)
 10/06/2023 5:17 PM   Arthur West 21-Aug-1960 [id number]  CC: Chief Complaint  Patient presents with   Hematuria   HPI: Arthur West is a 63 y.o. male with PMH fluctuating PSA who presents today for evaluation of possible hematuria.   Today he reports he noticed blood inside his condom after he had sex with his wife earlier this week.  This only happened 1 time and has not recurred since.  No episodes of hematuria.  No pain associated with this episode.  He denies fever, chills, nausea, or vomiting.  He is a never smoker, no family history of prostate cancer.  In-office UA today positive for trace intact blood; urine microscopy pan negative.  PVR 0 mL.  PMH: Past Medical History:  Diagnosis Date   Arthritis    knees   Colon cancer (HCC)    Headache    sinus headaches   Hyperlipidemia    Sinus congestion    with pollen     Surgical History: Past Surgical History:  Procedure Laterality Date   COLONOSCOPY  01/2017   Armbruster ta polyp    EYE SURGERY     as child   LAPAROSCOPIC RIGHT COLECTOMY     hx colono cancer    Home Medications:  Allergies as of 10/06/2023       Reactions   Hydrocodone Other (See Comments)   Headache   Oxycodone Other (See Comments)   Headache,N/V        Medication List        Accurate as of October 06, 2023  5:17 PM. If you have any questions, ask your nurse or doctor.          STOP taking these medications    amLODipine 2.5 MG tablet Commonly known as: NORVASC Stopped by: Carman Ching   Meclizine HCl 25 MG Chew Stopped by: Carman Ching   rosuvastatin 40 MG tablet Commonly known as: CRESTOR Stopped by: Carman Ching       TAKE these medications    aspirin EC 81 MG tablet Take 81 mg by mouth daily. Swallow whole.   multivitamin with minerals Tabs tablet Take 1 tablet by mouth daily.        Allergies:  Allergies  Allergen Reactions   Hydrocodone Other (See Comments)     Headache   Oxycodone Other (See Comments)    Headache,N/V    Family History: Family History  Problem Relation Age of Onset   Arthritis Other    Breast cancer Other    Stroke Other    Hypertension Other    Heart attack Father 41   Heart attack Paternal Grandfather 31   Cancer Sister 75       breast cancer    Cancer Maternal Aunt 39       ovarian cancer    Cancer Maternal Aunt        colon polyps (4)   Colon cancer Neg Hx    Colon polyps Neg Hx    Esophageal cancer Neg Hx    Rectal cancer Neg Hx    Stomach cancer Neg Hx     Social History:   reports that he has never smoked. He has never used smokeless tobacco. He reports current alcohol use of about 2.0 standard drinks of alcohol per week. He reports that he does not use drugs.  Physical Exam: BP (!) 161/103   Pulse 69   Ht 6' (1.829 m)   Wt 244 lb (110.7 kg)  BMI 33.09 kg/m   Constitutional:  Alert and oriented, no acute distress, nontoxic appearing HEENT: [place], AT Cardiovascular: No clubbing, cyanosis, or edema Respiratory: Normal respiratory effort, no increased work of breathing Skin: No rashes, bruises or suspicious lesions Neurologic: Grossly intact, no focal deficits, moving all 4 extremities Psychiatric: Normal mood and affect  Laboratory Data: Results for orders placed or performed in visit on 10/06/23  Microscopic Examination   Collection Time: 10/06/23  2:48 PM   Urine  Result Value Ref Range   WBC, UA 0-5 0 - 5 /hpf   RBC, Urine 0-2 0 - 2 /hpf   Epithelial Cells (non renal) 0-10 0 - 10 /hpf   Mucus, UA Present (A) Not Estab.   Bacteria, UA Few None seen/Few  Urinalysis, Complete   Collection Time: 10/06/23  2:48 PM  Result Value Ref Range   Specific Gravity, UA 1.025 1.005 - 1.030   pH, UA 5.5 5.0 - 7.5   Color, UA Yellow Yellow   Appearance Ur Clear Clear   Leukocytes,UA Negative Negative   Protein,UA Negative Negative/Trace   Glucose, UA Negative Negative   Ketones, UA Negative Negative    RBC, UA Trace (A) Negative   Bilirubin, UA Negative Negative   Urobilinogen, Ur 0.2 0.2 - 1.0 mg/dL   Nitrite, UA Negative Negative   Microscopic Examination See below:   BLADDER SCAN AMB NON-IMAGING   Collection Time: 10/06/23  2:53 PM  Result Value Ref Range   Scan Result 0ml    Assessment & Plan:   1. Hematospermia (Primary) Single episode of seeing blood inside his condom after sex.  Based on the circumstances around this incident, I think hematospermia is more likely than gross hematuria.  His UA today is bland, no smoking history, and no other episodes of visible blood in either semen or urine.  We discussed that hematospermia is largely a benign process that does not require further workup.  Since there is a question of if this could possibly represent hematuria, I did ask him to pay attention to his urine and keep a low threshold to return to clinic if he has an episode of gross hematuria, at which point we can pursue hematuria workup.  He is in agreement with this plan. - Urinalysis, Complete - BLADDER SCAN AMB NON-IMAGING   Return if symptoms worsen or fail to improve.  Carman Ching, PA-C  [facility] [place] [address] [place], [place] [address] [phone]

## 2023-10-09 LAB — SURGICAL PATHOLOGY

## 2023-10-12 ENCOUNTER — Telehealth: Payer: Self-pay | Admitting: Family Medicine

## 2023-10-12 ENCOUNTER — Encounter: Payer: Self-pay | Admitting: Gastroenterology

## 2023-10-12 NOTE — Telephone Encounter (Signed)
 Dr Birdie Sons is no longer at this location. Please call the office to schedule a Transfer of Care to either Dr Charlann Lange, Darleen Crocker or Kara Dies, NP. Purvis [place], please schedule a TOC visit for this patient. [facility]   Thank you

## 2023-10-17 ENCOUNTER — Encounter: Payer: Self-pay | Admitting: Gastroenterology

## 2023-10-23 ENCOUNTER — Ambulatory Visit: Admitting: Family Medicine

## 2023-10-25 ENCOUNTER — Ambulatory Visit: Admitting: Family Medicine

## 2023-10-31 ENCOUNTER — Telehealth: Payer: Self-pay

## 2023-10-31 NOTE — Telephone Encounter (Signed)
 Error

## 2023-11-02 ENCOUNTER — Ambulatory Visit (INDEPENDENT_AMBULATORY_CARE_PROVIDER_SITE_OTHER): Admitting: Nurse Practitioner

## 2023-11-02 ENCOUNTER — Encounter: Payer: Self-pay | Admitting: Nurse Practitioner

## 2023-11-02 VITALS — BP 126/84 | HR 69 | Temp 98.9°F | Ht 72.0 in | Wt 237.6 lb

## 2023-11-02 DIAGNOSIS — R7303 Prediabetes: Secondary | ICD-10-CM

## 2023-11-02 DIAGNOSIS — E785 Hyperlipidemia, unspecified: Secondary | ICD-10-CM

## 2023-11-02 DIAGNOSIS — I10 Essential (primary) hypertension: Secondary | ICD-10-CM | POA: Diagnosis not present

## 2023-11-02 LAB — COMPREHENSIVE METABOLIC PANEL WITH GFR
ALT: 14 U/L (ref 0–53)
AST: 15 U/L (ref 0–37)
Albumin: 4.4 g/dL (ref 3.5–5.2)
Alkaline Phosphatase: 61 U/L (ref 39–117)
BUN: 15 mg/dL (ref 6–23)
CO2: 31 meq/L (ref 19–32)
Calcium: 9.4 mg/dL (ref 8.4–10.5)
Chloride: 103 meq/L (ref 96–112)
Creatinine, Ser: 1.16 mg/dL (ref 0.40–1.50)
GFR: 67.44 mL/min (ref >60.00)
Glucose, Bld: 108 mg/dL — ABNORMAL HIGH (ref 70–99)
Potassium: 3.9 meq/L (ref 3.5–5.1)
Sodium: 140 meq/L (ref 135–145)
Total Bilirubin: 1 mg/dL (ref 0.2–1.2)
Total Protein: 7.4 g/dL (ref 6.0–8.3)

## 2023-11-02 LAB — LIPID PANEL
Cholesterol: 199 mg/dL (ref 0–200)
HDL: 44.9 mg/dL (ref >39.00)
LDL Cholesterol: 127 mg/dL — ABNORMAL HIGH (ref 0–99)
NonHDL: 154.18
Total CHOL/HDL Ratio: 4
Triglycerides: 136 mg/dL (ref 0.0–149.0)
VLDL: 27.2 mg/dL (ref 0.0–40.0)

## 2023-11-02 LAB — HEMOGLOBIN A1C: Hgb A1c MFr Bld: 6 % (ref 4.6–6.5)

## 2023-11-02 NOTE — Patient Instructions (Signed)
 Please monitor blood pressure daily, make a log and bring it to the appointment in 2 weeks.

## 2023-11-02 NOTE — Progress Notes (Signed)
 Established Patient Office Visit  Subjective:  Patient ID: Arthur West, male    DOB: Jan 12, 1961  Age: 63 y.o. MRN: [id number]  CC:  Chief Complaint  Patient presents with   Medical Management of Chronic Issues  Discussed the use of a AI scribe software for clinical note transcription with the patient, who gave verbal consent to proceed.   HPI  Arthur West is a 63 year old male with hypertension who presents with concerns about blood pressure management.  He has not been taking his blood pressure medication, amlodipine, for several months due to issues with prescription refills. His home blood pressure readings vary, sometimes reaching 140/80-89 mmHg, but often around 130/80-89 mmHg. A recent urology visit recorded a significantly elevated blood pressure of 161/103 mmHg, the highest he has experienced. No associated symptoms such as headaches or leg swelling are present.  He has a history of prediabetes with a previous A1c of 6.1%  on 03/22/22. He is not on any medication for this condition and attempts to manage it through dietary choices. He consumes diet Pepsi and green tea, avoiding sugary drinks.  He is not currently taking any cholesterol medication, although he was previously prescribed Crestor. Past lab work indicated slightly elevated cholesterol levels.  No swelling in the legs, chest pain, or other symptoms associated with high blood pressure. He experiences headaches due to sinus pressure but denies any other symptoms.  HPI   Past Medical History:  Diagnosis Date   Arthritis    knees   Colon cancer (HCC)    Headache    sinus headaches   Hyperlipidemia    Sinus congestion    with pollen     Past Surgical History:  Procedure Laterality Date   COLONOSCOPY  01/2017   Armbruster ta polyp    EYE SURGERY     as child   LAPAROSCOPIC RIGHT COLECTOMY     hx colono cancer    Family History  Problem Relation Age of Onset   Arthritis Other    Breast cancer  Other    Stroke Other    Hypertension Other    Heart attack Father 40   Heart attack Paternal Grandfather 52   Cancer Sister 57       breast cancer    Cancer Maternal Aunt 24       ovarian cancer    Cancer Maternal Aunt        colon polyps (4)   Colon cancer Neg Hx    Colon polyps Neg Hx    Esophageal cancer Neg Hx    Rectal cancer Neg Hx    Stomach cancer Neg Hx     Social History   Socioeconomic History   Marital status: Married    Spouse name: [name]   Number of children: 3   Years of education: Not on file   Highest education level: Not on file  Occupational History   Not on file  Tobacco Use   Smoking status: Never   Smokeless tobacco: Never  Vaping Use   Vaping status: Never Used  Substance and Sexual Activity   Alcohol use: Yes    Alcohol/week: 2.0 standard drinks of alcohol    Types: 2 Standard drinks or equivalent per week    Comment: 2 beers weekly   Drug use: No   Sexual activity: Yes  Other Topics Concern   Not on file  Social History Narrative   [place] [employer]   #3 children ages 19+  Employed as Press photographer in [employer]   Social Drivers of Corporate investment banker Strain: Not on file  Food Insecurity: Not on file  Transportation Needs: Not on file  Physical Activity: Not on file  Stress: Not on file  Social Connections: Not on file  Intimate Partner Violence: Not on file     Outpatient Medications Prior to Visit  Medication Sig Dispense Refill   aspirin EC 81 MG tablet Take 81 mg by mouth daily. Swallow whole.     Multiple Vitamin (MULTIVITAMIN WITH MINERALS) TABS tablet Take 1 tablet by mouth daily.     No facility-administered medications prior to visit.    Allergies  Allergen Reactions   Hydrocodone Other (See Comments)    Headache   Oxycodone Other (See Comments)    Headache,N/V    ROS Review of Systems Negative unless indicated in HPI.    Objective:    Physical Exam Constitutional:       Appearance: Normal appearance.  HENT:     Mouth/Throat:     Mouth: Mucous membranes are moist.  Eyes:     Conjunctiva/sclera: Conjunctivae normal.     Pupils: Pupils are equal, round, and reactive to light.  Cardiovascular:     Rate and Rhythm: Normal rate and regular rhythm.     Pulses: Normal pulses.     Heart sounds: Normal heart sounds.  Pulmonary:     Effort: Pulmonary effort is normal.     Breath sounds: Normal breath sounds.  Musculoskeletal:     Cervical back: Normal range of motion. No tenderness.  Skin:    General: Skin is warm.     Findings: No bruising.  Neurological:     General: No focal deficit present.     Mental Status: He is alert and oriented to person, place, and time. Mental status is at baseline.  Psychiatric:        Mood and Affect: Mood normal.        Behavior: Behavior normal.        Thought Content: Thought content normal.        Judgment: Judgment normal.     BP 126/84   Pulse 69   Temp 98.9 F (37.2 C)   Ht 6' (1.829 m)   Wt 237 lb 9.6 oz (107.8 kg)   SpO2 96%   BMI 32.22 kg/m  Wt Readings from Last 3 Encounters:  11/02/23 237 lb 9.6 oz (107.8 kg)  10/06/23 244 lb (110.7 kg)  10/04/23 235 lb (106.6 kg)     Health Maintenance  Topic Date Due   DTaP/Tdap/Td (1 - Tdap) Never done   Zoster Vaccines- Shingrix (1 of 2) 03/22/2022   COVID-19 Vaccine (4 - 2024-25 season) 11/17/2023 (Originally 04/02/2023)   INFLUENZA VACCINE  03/01/2024   Colonoscopy  10/04/2026   Hepatitis C Screening  Completed   HIV Screening  Completed   HPV VACCINES  Aged Out    There are no preventive care reminders to display for this patient.  Lab Results  Component Value Date   TSH 1.00 11/23/2015   Lab Results  Component Value Date   WBC 5.0 10/22/2020   HGB 15.7 10/22/2020   HCT 49.7 10/22/2020   MCV 80.6 10/22/2020   PLT 211 10/22/2020   Lab Results  Component Value Date   NA 141 03/22/2022   K 4.1 03/22/2022   CHLORIDE 105 05/30/2017   CO2 27  03/22/2022   GLUCOSE 105 (H) 03/22/2022  BUN 13 03/22/2022   CREATININE 1.08 03/22/2022   BILITOT 0.7 03/22/2022   ALKPHOS 67 03/22/2022   AST 17 03/22/2022   ALT 18 03/22/2022   PROT 6.7 03/22/2022   ALBUMIN 4.2 03/22/2022   CALCIUM 9.0 03/22/2022   ANIONGAP 10 10/22/2020   EGFR >60 05/30/2017   GFR 74.32 03/22/2022   Lab Results  Component Value Date   CHOL 172 03/22/2022   Lab Results  Component Value Date   HDL 45.50 03/22/2022   Lab Results  Component Value Date   LDLCALC 105 (H) 03/22/2022   Lab Results  Component Value Date   TRIG 107.0 03/22/2022   Lab Results  Component Value Date   CHOLHDL 4 03/22/2022   Lab Results  Component Value Date   HGBA1C 6.1 03/22/2022      Assessment & Plan:  Hypertension, unspecified type Assessment & Plan: Hypertension unmanaged due to lapse in amlodipine. Blood pressure readings variable, highest at 161/103 at urology appointment last month.   Vitals:   11/02/23 0826  BP: 126/84   - Monitor blood pressure daily for two weeks, log readings. - Advised pt to follow a low sodium and heart healthy diet - Follow-up in two weeks to review log and adjust medication. - Labs as outlined.  Orders: -     Comprehensive metabolic panel with GFR -     Lipid panel  Prediabetes Assessment & Plan: Lab Results  Component Value Date   HGBA1C 6.1 03/22/2022  -Lifestyle modifications discussed. - Will check A1c test.    Orders: -     Lipid panel -     Hemoglobin A1c  Hyperlipidemia, unspecified hyperlipidemia type Assessment & Plan: Lab Results  Component Value Date   CHOL 172 03/22/2022   HDL 45.50 03/22/2022   LDLCALC 105 (H) 03/22/2022   LDLDIRECT 148.0 10/23/2020   TRIG 107.0 03/22/2022   CHOLHDL 4 03/22/2022  -Pt not taking Crestor at present. - Dietary modifications discussed. -Will check lipid panel.    Orders: -     Lipid panel    Follow-up: Return in about 2 weeks (around 11/16/2023) for  hypertension.   Kara Dies, NP

## 2023-11-02 NOTE — Assessment & Plan Note (Signed)
 Hypertension unmanaged due to lapse in amlodipine. Blood pressure readings variable, highest at 161/103 at urology appointment last month.   Vitals:   11/02/23 0826  BP: 126/84   - Monitor blood pressure daily for two weeks, log readings. - Advised pt to follow a low sodium and heart healthy diet - Follow-up in two weeks to review log and adjust medication. - Labs as outlined.

## 2023-11-02 NOTE — Assessment & Plan Note (Addendum)
 Lab Results  Component Value Date   CHOL 172 03/22/2022   HDL 45.50 03/22/2022   LDLCALC 105 (H) 03/22/2022   LDLDIRECT 148.0 10/23/2020   TRIG 107.0 03/22/2022   CHOLHDL 4 03/22/2022  -Pt not taking Crestor at present. - Dietary modifications discussed. -Will check lipid panel.

## 2023-11-02 NOTE — Assessment & Plan Note (Signed)
 Lab Results  Component Value Date   HGBA1C 6.1 03/22/2022  -Lifestyle modifications discussed. - Will check A1c test.

## 2023-11-10 ENCOUNTER — Telehealth: Payer: Self-pay

## 2023-11-10 NOTE — Telephone Encounter (Signed)
-----   Message from Kara Dies sent at 11/10/2023  4:56 AM EDT ----- The blood work shows normal electrolytes, kidney and liver function. LDL is elevated compared to previous lab.  Would recommend starting back on Crestor. Please send refill of Crestor 20 mg if pt is agreeable.   A1c stable at 6

## 2023-11-10 NOTE — Telephone Encounter (Signed)
 Called the Patient and someone picked the phone up but would not talk then the phone hung up so I called back and same thing again. If Patient calls back it is okay for E2C2 to give the lab results and let the clinic know if the Patient is agreeable to the Crestor 20 mg so we can order it.

## 2023-11-21 ENCOUNTER — Ambulatory Visit: Admitting: Nurse Practitioner

## 2023-12-05 ENCOUNTER — Ambulatory Visit: Admitting: Nurse Practitioner

## 2023-12-05 ENCOUNTER — Encounter: Payer: Self-pay | Admitting: Nurse Practitioner

## 2023-12-05 VITALS — BP 124/82 | HR 78 | Temp 97.7°F | Ht 72.0 in | Wt 237.4 lb

## 2023-12-05 DIAGNOSIS — E785 Hyperlipidemia, unspecified: Secondary | ICD-10-CM

## 2023-12-05 DIAGNOSIS — Z23 Encounter for immunization: Secondary | ICD-10-CM

## 2023-12-05 DIAGNOSIS — I10 Essential (primary) hypertension: Secondary | ICD-10-CM | POA: Diagnosis not present

## 2023-12-05 MED ORDER — LOSARTAN POTASSIUM 25 MG PO TABS: 25.0000 mg | ORAL_TABLET | Freq: Every day | ORAL | 1 refills | Status: AC

## 2023-12-05 MED ORDER — ROSUVASTATIN CALCIUM 10 MG PO TABS: 10.0000 mg | ORAL_TABLET | Freq: Every day | ORAL | 3 refills | Status: DC

## 2023-12-05 NOTE — Patient Instructions (Addendum)
 Please monitor your blood pressure 2-3 times a week at the same time and follow up in a month  Losartan Tablets What is this medication? LOSARTAN (loe SAR tan) treats high blood pressure. It may also be used to prevent a stroke in people with heart disease and high blood pressure. It can be used to prevent kidney damage in people with diabetes. It works by relaxing the blood vessels, which helps decrease the amount of work your heart has to do. It belongs to a group of medications called ARBs. This medicine may be used for other purposes; ask your health care provider or pharmacist if you have questions. COMMON BRAND NAME(S): Cozaar What should I tell my care team before I take this medication? They need to know if you have any of these conditions: Heart failure Kidney disease Liver disease An unusual or allergic reaction to losartan, other medications, foods, dyes, or preservatives Pregnant or trying to get pregnant Breast-feeding How should I use this medication? Take this medication by mouth. Take it as directed on the prescription label at the same time every day. You can take it with or without food. If it upsets your stomach, take it with food. Keep taking it unless your care team tells you to stop. Talk to your care team about the use of this medication in children. While it may be prescribed for children as young as 6 for selected conditions, precautions do apply. Overdosage: If you think you have taken too much of this medicine contact a poison control center or emergency room at once. NOTE: This medicine is only for you. Do not share this medicine with others. What if I miss a dose? If you miss a dose, take it as soon as you can. If it is almost time for your next dose, take only that dose. Do not take double or extra doses. What may interact with this medication? Aliskiren ACE inhibitors, like enalapril or lisinopril Diuretics, especially amiloride, eplerenone, spironolactone, or  triamterene Lithium NSAIDs, medications for pain and inflammation, like ibuprofen or naproxen Potassium salts or potassium supplements This list may not describe all possible interactions. Give your health care provider a list of all the medicines, herbs, non-prescription drugs, or dietary supplements you use. Also tell them if you smoke, drink alcohol, or use illegal drugs. Some items may interact with your medicine. What should I watch for while using this medication? Visit your care team for regular checks on your progress. Check your blood pressure as directed. Know what your blood pressure should be and when to contact your care team. Do not treat yourself for coughs, colds, or pain while you are using this medication without asking your care team for advice. Some medications may increase your blood pressure. Taking this medication is only part of a total heart healthy program. Ask your care team if there are other changes you can make to improve your overall health. Avoid salt substitutes unless you are told otherwise by your care team. This medication may affect your coordination, reaction time, or judgment. Do not drive or operate machinery until you know how this medication affects you. Sit up or stand slowly to reduce the risk of dizzy or fainting spells. Drinking alcohol with this medication can increase the risk of these side effects. Discuss this medication with your care team if you may be pregnant. Serious birth defects can occur if you take this medication during the second and third trimesters. Discuss other treatment options with your care team.  There are benefits and risks to taking medications during pregnancy. Your care team can help you find the option that works for you. What side effects may I notice from receiving this medication? Side effects that you should report to your care team as soon as possible: Allergic reactions--skin rash, itching, hives, swelling of the face, lips,  tongue, or throat High potassium level--muscle weakness, fast or irregular heartbeat Kidney injury--decrease in the amount of urine, swelling of the ankles, hands, or feet Low blood pressure--dizziness, feeling faint or lightheaded, blurry vision Side effects that usually do not require medical attention (report to your care team if they continue or are bothersome): Dizziness Headache Runny or stuffy nose This list may not describe all possible side effects. Call your doctor for medical advice about side effects. You may report side effects to FDA at 1-800-FDA-1088. Where should I keep my medication? Keep out of the reach of children and pets. Store at room temperature between 20 and 25 degrees C (68 and 77 degrees F). Protect from light. Keep the container tightly closed. Get rid of any unused medication after the expiration date. To get rid of medications that are no longer needed or have expired: Take the medication to a medication take-back program. Check with your pharmacy or law enforcement to find a location. If you cannot return the medication, check the label or package insert to see if the medication should be thrown out in the garbage or flushed down the toilet. If you are not sure, ask your care team. If it is safe to put in the trash, empty the medication out of the container. Mix the medication with cat litter, dirt, coffee grounds, or other unwanted substance. Seal the mixture in a bag or container. Put it in the trash. NOTE: This sheet is a summary. It may not cover all possible information. If you have questions about this medicine, talk to your doctor, pharmacist, or health care provider.  2024 Elsevier/Gold Standard (2022-12-21 00:00:00)

## 2023-12-05 NOTE — Progress Notes (Signed)
 Established Patient Office Visit  Subjective:  Patient ID: Arthur West, male    DOB: November 20, 1960  Age: 63 y.o. MRN: [id number]  CC:  Chief Complaint  Patient presents with   Hypertension    F/up on hypertension    HPI  Arthur West presents for follow up on hypertension. He checks BP at home and the readings are between 135-149/91-100.  Has elevated LDL 127 has been trying to consume healthy diet.   He is due for tetanus vaccine.  HPI   Past Medical History:  Diagnosis Date   Arthritis    knees   Colon cancer (HCC)    Headache    sinus headaches   Hyperlipidemia    Sinus congestion    with pollen     Past Surgical History:  Procedure Laterality Date   COLONOSCOPY  01/2017   [place] ta polyp    EYE SURGERY     as child   LAPAROSCOPIC RIGHT COLECTOMY     hx colono cancer    Family History  Problem Relation Age of Onset   Arthritis Other    Breast cancer Other    Stroke Other    Hypertension Other    Heart attack Father 61   Heart attack Paternal Grandfather 24   Cancer Sister 74       breast cancer    Cancer Maternal Aunt 21       ovarian cancer    Cancer Maternal Aunt        colon polyps (4)   Colon cancer Neg Hx    Colon polyps Neg Hx    Esophageal cancer Neg Hx    Rectal cancer Neg Hx    Stomach cancer Neg Hx     Social History   Socioeconomic History   Marital status: Married    Spouse name: [name]   Number of children: 3   Years of education: Not on file   Highest education level: Not on file  Occupational History   Not on file  Tobacco Use   Smoking status: Never   Smokeless tobacco: Never  Vaping Use   Vaping status: Never Used  Substance and Sexual Activity   Alcohol use: Yes    Alcohol/week: 2.0 standard drinks of alcohol    Types: 2 Standard drinks or equivalent per week    Comment: 2 beers weekly   Drug use: No   Sexual activity: Yes  Other Topics Concern   Not on file  Social History Narrative   Gwenith Lente   #3 children ages 47+   Employed as Press photographer in [employer]   Social Drivers of Corporate investment banker Strain: Not on file  Food Insecurity: Not on file  Transportation Needs: Not on file  Physical Activity: Not on file  Stress: Not on file  Social Connections: Not on file  Intimate Partner Violence: Not on file     Outpatient Medications Prior to Visit  Medication Sig Dispense Refill   aspirin  EC 81 MG tablet Take 81 mg by mouth daily. Swallow whole.     Multiple Vitamin (MULTIVITAMIN WITH MINERALS) TABS tablet Take 1 tablet by mouth daily.     No facility-administered medications prior to visit.    Allergies  Allergen Reactions   Hydrocodone Other (See Comments)    Headache   Oxycodone  Other (See Comments)    Headache,N/V    ROS Review of Systems Negative unless indicated in HPI.  Objective:    Physical Exam Constitutional:      Appearance: Normal appearance.  HENT:     Mouth/Throat:     Mouth: Mucous membranes are moist.  Eyes:     Conjunctiva/sclera: Conjunctivae normal.     Pupils: Pupils are equal, round, and reactive to light.  Cardiovascular:     Rate and Rhythm: Normal rate and regular rhythm.     Pulses: Normal pulses.     Heart sounds: Normal heart sounds.  Pulmonary:     Effort: Pulmonary effort is normal.     Breath sounds: Normal breath sounds.  Musculoskeletal:     Cervical back: Normal range of motion. No tenderness.  Skin:    General: Skin is warm.     Findings: No bruising.  Neurological:     General: No focal deficit present.     Mental Status: He is alert and oriented to person, place, and time. Mental status is at baseline.  Psychiatric:        Mood and Affect: Mood normal.        Behavior: Behavior normal.        Thought Content: Thought content normal.     BP 124/82   Pulse 78   Temp 97.7 F (36.5 C)   Ht 6' (1.829 m)   Wt 237 lb 6.4 oz (107.7 kg)   SpO2 96%   BMI 32.20 kg/m  Wt  Readings from Last 3 Encounters:  12/05/23 237 lb 6.4 oz (107.7 kg)  11/02/23 237 lb 9.6 oz (107.8 kg)  10/06/23 244 lb (110.7 kg)     Health Maintenance  Topic Date Due   Zoster Vaccines- Shingrix  (1 of 2) 03/22/2022   COVID-19 Vaccine (4 - 2024-25 season) 12/20/2023 (Originally 04/02/2023)   INFLUENZA VACCINE  03/01/2024   Colonoscopy  10/04/2026   DTaP/Tdap/Td (2 - Td or Tdap) 12/04/2033   Hepatitis C Screening  Completed   HIV Screening  Completed   HPV VACCINES  Aged Out   Meningococcal B Vaccine  Aged Out    There are no preventive care reminders to display for this patient.  Lab Results  Component Value Date   TSH 1.00 11/23/2015   Lab Results  Component Value Date   WBC 5.0 10/22/2020   HGB 15.7 10/22/2020   HCT 49.7 10/22/2020   MCV 80.6 10/22/2020   PLT 211 10/22/2020   Lab Results  Component Value Date   NA 140 11/02/2023   K 3.9 11/02/2023   CHLORIDE 105 05/30/2017   CO2 31 11/02/2023   GLUCOSE 108 (H) 11/02/2023   BUN 15 11/02/2023   CREATININE 1.16 11/02/2023   BILITOT 1.0 11/02/2023   ALKPHOS 61 11/02/2023   AST 15 11/02/2023   ALT 14 11/02/2023   PROT 7.4 11/02/2023   ALBUMIN 4.4 11/02/2023   CALCIUM  9.4 11/02/2023   ANIONGAP 10 10/22/2020   EGFR >60 05/30/2017   GFR 67.44 11/02/2023   Lab Results  Component Value Date   CHOL 199 11/02/2023   Lab Results  Component Value Date   HDL 44.90 11/02/2023   Lab Results  Component Value Date   LDLCALC 127 (H) 11/02/2023   Lab Results  Component Value Date   TRIG 136.0 11/02/2023   Lab Results  Component Value Date   CHOLHDL 4 11/02/2023   Lab Results  Component Value Date   HGBA1C 6.0 11/02/2023      Assessment & Plan:  Hypertension, unspecified type Assessment & Plan: Elevated BP readings  at home ranging from 135-149/91-100.  -Will start on losartan  25 mg daily. - Monitor blood pressure daily for 2-3 times a week and log readings. - Advised pt to follow a low sodium and  heart healthy diet. -Continue losartan  25 mg daily    Orders: -     Losartan  Potassium; Take 1 tablet (25 mg total) by mouth daily.  Dispense: 90 tablet; Refill: 1 -     Potassium; Future  Hyperlipidemia, unspecified hyperlipidemia type Assessment & Plan: Lab Results  Component Value Date   CHOL 199 11/02/2023   HDL 44.90 11/02/2023   LDLCALC 127 (H) 11/02/2023   LDLDIRECT 148.0 10/23/2020   TRIG 136.0 11/02/2023   CHOLHDL 4 11/02/2023  The 10-year ASCVD risk score (Arnett DK, et al., 2019) is: 14.1% - Will start on Crestor  10 mg and will increase upto Crestor  40 mg if pt tolerated well.   Orders: -     Rosuvastatin  Calcium ; Take 1 tablet (10 mg total) by mouth daily.  Dispense: 90 tablet; Refill: 3  Need for Tdap vaccination -     Tdap vaccine greater than or equal to 7yo IM    Follow-up: Return for 4 days non fasting lab and BP follow up in 1 month.   Myisha Pickerel, NP

## 2023-12-08 ENCOUNTER — Other Ambulatory Visit

## 2023-12-20 NOTE — Assessment & Plan Note (Signed)
 Lab Results  Component Value Date   CHOL 199 11/02/2023   HDL 44.90 11/02/2023   LDLCALC 127 (H) 11/02/2023   LDLDIRECT 148.0 10/23/2020   TRIG 136.0 11/02/2023   CHOLHDL 4 11/02/2023  The 10-year ASCVD risk score (Arnett DK, et al., 2019) is: 14.1% - Will start on Crestor  10 mg and will increase upto Crestor  40 mg if pt tolerated well.

## 2023-12-20 NOTE — Assessment & Plan Note (Signed)
 Elevated BP readings at home ranging from 135-149/91-100.  -Will start on losartan  25 mg daily. - Monitor blood pressure daily for 2-3 times a week and log readings. - Advised pt to follow a low sodium and heart healthy diet. -Continue losartan  25 mg daily

## 2024-01-05 ENCOUNTER — Ambulatory Visit: Admitting: Nurse Practitioner

## 2024-02-15 ENCOUNTER — Encounter

## 2024-04-30 ENCOUNTER — Telehealth: Payer: Self-pay

## 2024-04-30 NOTE — Telephone Encounter (Signed)
 Lm and sent MyChart message to call office and ask for Darice augusto Cramp to reschedule patient's TOC.

## 2024-05-20 ENCOUNTER — Encounter

## 2024-06-24 ENCOUNTER — Telehealth: Payer: Self-pay

## 2024-06-24 ENCOUNTER — Encounter: Payer: Self-pay | Admitting: Internal Medicine

## 2024-06-24 ENCOUNTER — Ambulatory Visit (INDEPENDENT_AMBULATORY_CARE_PROVIDER_SITE_OTHER): Admitting: Internal Medicine

## 2024-06-24 VITALS — BP 128/76 | HR 62 | Temp 98.1°F | Ht 72.0 in | Wt 239.0 lb

## 2024-06-24 DIAGNOSIS — E785 Hyperlipidemia, unspecified: Secondary | ICD-10-CM | POA: Diagnosis not present

## 2024-06-24 DIAGNOSIS — H6123 Impacted cerumen, bilateral: Secondary | ICD-10-CM

## 2024-06-24 DIAGNOSIS — I10 Essential (primary) hypertension: Secondary | ICD-10-CM

## 2024-06-24 DIAGNOSIS — R0981 Nasal congestion: Secondary | ICD-10-CM | POA: Diagnosis not present

## 2024-06-24 DIAGNOSIS — J329 Chronic sinusitis, unspecified: Secondary | ICD-10-CM

## 2024-06-24 DIAGNOSIS — H6120 Impacted cerumen, unspecified ear: Secondary | ICD-10-CM | POA: Insufficient documentation

## 2024-06-24 LAB — POC COVID19 BINAXNOW: SARS Coronavirus 2 Ag: NEGATIVE

## 2024-06-24 LAB — LIPID PANEL
Cholesterol: 191 mg/dL (ref 0–200)
HDL: 47 mg/dL (ref >39.00)
LDL Cholesterol: 117 mg/dL — ABNORMAL HIGH (ref 0–99)
NonHDL: 143.93
Total CHOL/HDL Ratio: 4
Triglycerides: 137 mg/dL (ref 0.0–149.0)
VLDL: 27.4 mg/dL (ref 0.0–40.0)

## 2024-06-24 LAB — COMPREHENSIVE METABOLIC PANEL WITH GFR
ALT: 15 U/L (ref 0–53)
AST: 16 U/L (ref 0–37)
Albumin: 4.3 g/dL (ref 3.5–5.2)
Alkaline Phosphatase: 57 U/L (ref 39–117)
BUN: 12 mg/dL (ref 6–23)
CO2: 31 meq/L (ref 19–32)
Calcium: 9.4 mg/dL (ref 8.4–10.5)
Chloride: 103 meq/L (ref 96–112)
Creatinine, Ser: 1.14 mg/dL (ref 0.40–1.50)
GFR: 68.55 mL/min (ref >60.00)
Glucose, Bld: 103 mg/dL — ABNORMAL HIGH (ref 70–99)
Potassium: 4 meq/L (ref 3.5–5.1)
Sodium: 141 meq/L (ref 135–145)
Total Bilirubin: 0.9 mg/dL (ref 0.2–1.2)
Total Protein: 6.9 g/dL (ref 6.0–8.3)

## 2024-06-24 LAB — POCT INFLUENZA A/B
Influenza A, POC: NEGATIVE
Influenza B, POC: NEGATIVE

## 2024-06-24 NOTE — Progress Notes (Signed)
 Acute Office Visit  Subjective:     Patient ID: Arthur West, male    DOB: January 28, 1961, 63 y.o.   MRN: [id number]  Chief Complaint  Patient presents with   Acute Visit    Intermittent Congestion and sinus pressure x months   Discussed the use of AI scribe software for clinical note transcription with the patient, who gave verbal consent to proceed.  History of Present Illness TRE SANKER is a 63 year old male with chronic sinus congestion.  Chronic sinonasal congestion and head pressure - Chronic sinonasal congestion present throughout the year, characterized by persistent congestion and intermittent rhinorrhea - Head pressure is the predominant symptom, worsened by positional changes such as bending forward or lying down - Symptoms have been present for several days during this episode, but are part of a longstanding pattern - No associated ear pain, ear fullness, or headaches, though intermittent pressure is present - Over-the-counter Tylenol  Severe Sinus provides partial relief after approximately three days of use - No regular use of neti pot or saline nasal sprays - No current use of antihistamines such as Zyrtec or Claritin  History of covid-19 infection - Three prior episodes of COVID-19 infection, with the most recent episode occurring two to three months ago - Able to distinguish COVID-19 infection from other illnesses based on characteristic symptoms  Hypertension and hyperlipidemia management - Currently taking losartan  for hypertension and a cholesterol medication for hyperlipidemia - Initiated these medications during the last clinic visit    Review of Systems  Constitutional: Negative.  Negative for chills and fever.  HENT:  Positive for congestion. Negative for ear discharge, ear pain, sinus pain and sore throat.   Respiratory: Negative.    Cardiovascular: Negative.   Gastrointestinal: Negative.   Genitourinary: Negative.   Neurological: Negative.    Psychiatric/Behavioral: Negative.          Objective:    BP 128/76   Pulse 62   Temp 98.1 F (36.7 C)   Ht 6' (1.829 m)   Wt 239 lb (108.4 kg)   SpO2 95%   BMI 32.41 kg/m    Physical Exam Constitutional:      Appearance: Normal appearance.  HENT:     Head: Normocephalic and atraumatic.     Right Ear: External ear normal. There is impacted cerumen.     Left Ear: External ear normal. There is impacted cerumen.     Ears:     Comments: Bilateral cerumen impaction    Nose: Nose normal. No congestion or rhinorrhea.     Right Sinus: No maxillary sinus tenderness or frontal sinus tenderness.     Left Sinus: No maxillary sinus tenderness or frontal sinus tenderness.     Mouth/Throat:     Mouth: Mucous membranes are moist.     Pharynx: Oropharynx is clear. No oropharyngeal exudate or posterior oropharyngeal erythema.  Cardiovascular:     Rate and Rhythm: Normal rate and regular rhythm.     Heart sounds: Normal heart sounds.  Pulmonary:     Effort: Pulmonary effort is normal.     Breath sounds: Normal breath sounds. No wheezing, rhonchi or rales.  Neurological:     Mental Status: He is alert.  Psychiatric:        Mood and Affect: Mood normal.        Behavior: Behavior normal.     Results for orders placed or performed in visit on 06/24/24  POC COVID-19 BinaxNow  Result Value Ref Range  SARS Coronavirus 2 Ag Negative Negative  POCT Influenza A/B  Result Value Ref Range   Influenza A, POC Negative Negative   Influenza B, POC Negative Negative        Assessment & Plan:   Problem List Items Addressed This Visit       Cardiovascular and Mediastinum   Hypertension - Primary   - This problem is chronic and stable -Blood pressure today is well-controlled at 128/76 -Continue with losartan  25 mg daily -Will check CMP today -No further workup at this time      Relevant Orders   Comprehensive metabolic panel with GFR     Respiratory   Chronic sinusitis   -  Patient complains of chronic sinus congestion which occurs intermittently year-round with no seasonal variation -Patient currently complains of sinus congestion and pressure over the frontal area of his head -The symptoms occurred for the last 2 to 3 days -He has no fevers or ear pain or cough.  He does complain of mild runny nose and nasal congestion -His flu and COVID tests were negative -Suspect he likely has sinus congestion secondary to viral infection versus allergies -Patient takes Tylenol  cold and sinus at home for symptomatic relief -We discussed conservative measures like Nettie pot, saline nasal rinses, warm showers with steam inhalation as well as antihistamine to help with the symptoms -No further workup at this time        Nervous and Auditory   Cerumen impaction   - Patient noted to have bilateral cerumen impaction but it is not affecting his hearing nor causing pain currently -Instructed not to put Q-tips in his ears -Will start the patient on Debrox to see if this will help -Will recheck at next visit and see if he needs irrigation -No further workup at this time        Other   Hyperlipidemia   - This problem is chronic and stable -Patient is started on rosuvastatin  10 mg daily at last visit -Will recheck lipid panel and CMP today -No further workup at this time      Relevant Orders   Comprehensive metabolic panel with GFR   Lipid panel   Other Visit Diagnoses       Congestion of nasal sinus       Relevant Orders   POC COVID-19 BinaxNow (Completed)   POCT Influenza A/B (Completed)       No orders of the defined types were placed in this encounter.   No follow-ups on file.  Erza Mothershead, MD

## 2024-06-24 NOTE — Patient Instructions (Signed)
  VISIT SUMMARY: During today's visit, we discussed your chronic sinus congestion, hypertension, hyperlipidemia, and bilateral ear wax buildup. We reviewed your symptoms, current medications, and provided recommendations for managing your conditions.  YOUR PLAN: -CHRONIC SINUS CONGESTION: Chronic sinus congestion involves persistent nasal congestion and head pressure, often worsened by certain positions. To help relieve your symptoms, use saline nasal spray or a neti pot, and consider warm showers with steam inhalation. Over-the-counter antihistamines like Zyrtec or Claritin may help if allergies are a factor. If your symptoms do not improve or worsen after a week, please report back for further evaluation.  -HYPERTENSION: Hypertension, or high blood pressure, is being effectively managed with losartan . Your kidney function will be rechecked today  -HYPERLIPIDEMIA: Hyperlipidemia, or high cholesterol, is being managed with your current medication. Your cholesterol levels were rechecked to monitor the effectiveness of the treatment.  -CERUMEN IMPACTION, BILATERAL: Cerumen impaction means you have ear wax buildup in both ears. Avoid using Q-tips as they can damage your ear drums. Use over-the-counter Debrox to help soften and remove the ear wax.  INSTRUCTIONS: Please follow up if your sinus congestion symptoms persist or worsen after a week. Continue taking your medications for hypertension and hyperlipidemia as prescribed. Use Debrox for ear wax removal and avoid using Q-tips. If you have any concerns or new symptoms, schedule an appointment.                      Contains text generated by [employer].                                 Contains text generated by [employer].

## 2024-06-24 NOTE — Assessment & Plan Note (Signed)
-   Patient noted to have bilateral cerumen impaction but it is not affecting his hearing nor causing pain currently -Instructed not to put Q-tips in his ears -Will start the patient on Debrox to see if this will help -Will recheck at next visit and see if he needs irrigation -No further workup at this time

## 2024-06-24 NOTE — Assessment & Plan Note (Signed)
-   Patient complains of chronic sinus congestion which occurs intermittently year-round with no seasonal variation -Patient currently complains of sinus congestion and pressure over the frontal area of his head -The symptoms occurred for the last 2 to 3 days -He has no fevers or ear pain or cough.  He does complain of mild runny nose and nasal congestion -His flu and COVID tests were negative -Suspect he likely has sinus congestion secondary to viral infection versus allergies -Patient takes Tylenol  cold and sinus at home for symptomatic relief -We discussed conservative measures like Nettie pot, saline nasal rinses, warm showers with steam inhalation as well as antihistamine to help with the symptoms -No further workup at this time

## 2024-06-24 NOTE — Telephone Encounter (Unsigned)
 Copied from CRM #[id number]. Topic: Clinical - Medication Question >> Jun 24, 2024  4:56 PM Ashley R wrote: Reason for CRM: Discussed an antibiotic during appt today and would like to proceed with being prescribed for it.

## 2024-06-24 NOTE — Assessment & Plan Note (Signed)
-   This problem is chronic and stable -Patient is started on rosuvastatin  10 mg daily at last visit -Will recheck lipid panel and CMP today -No further workup at this time

## 2024-06-24 NOTE — Assessment & Plan Note (Signed)
-   This problem is chronic and stable -Blood pressure today is well-controlled at 128/76 -Continue with losartan  25 mg daily -Will check CMP today -No further workup at this time

## 2024-06-25 ENCOUNTER — Ambulatory Visit: Payer: Self-pay | Admitting: Internal Medicine

## 2024-06-25 ENCOUNTER — Other Ambulatory Visit: Payer: Self-pay | Admitting: Internal Medicine

## 2024-06-25 DIAGNOSIS — J019 Acute sinusitis, unspecified: Secondary | ICD-10-CM

## 2024-06-25 DIAGNOSIS — E785 Hyperlipidemia, unspecified: Secondary | ICD-10-CM

## 2024-06-25 MED ORDER — AMOXICILLIN-POT CLAVULANATE 875-125 MG PO TABS: 1.0000 | ORAL_TABLET | Freq: Two times a day (BID) | ORAL | 0 refills | Status: AC

## 2024-06-25 NOTE — Assessment & Plan Note (Signed)
 Patient called in requesting an antibiotic for his sinusitis. Patient with unlikely bacterial infection. However, given Thanksgiving holiday I will call in an antibiotic (Augmentin ) for him to take if his symptoms worsen.

## 2024-06-25 NOTE — Telephone Encounter (Signed)
 Left a message to call the office back regarding Dr. Yates note.

## 2024-06-26 NOTE — Telephone Encounter (Signed)
 Attempted to call Patient no answer and his mail box is full.

## 2024-07-01 NOTE — Telephone Encounter (Signed)
 Spoke to Patient regarding his lab results and this message from you. Patient is agreeable to increase the Rosuvastatin  and he states he tries to take it daily but does miss a dose here and there. Patient states he is starting to feel better due to the congestion is starting to drain.

## 2024-07-03 MED ORDER — ROSUVASTATIN CALCIUM 20 MG PO TABS: 20.0000 mg | ORAL_TABLET | Freq: Every day | ORAL | 1 refills | Status: AC
